# Patient Record
Sex: Female | Born: 1975 | Race: White | Hispanic: No | Marital: Single | Smoking: Never smoker
Health system: Southern US, Community
[De-identification: ages and names within clinical notes are randomized; demographics above are authoritative.]

## PROBLEM LIST (undated history)

## (undated) DIAGNOSIS — D259 Leiomyoma of uterus, unspecified: Secondary | ICD-10-CM

## (undated) DIAGNOSIS — F419 Anxiety disorder, unspecified: Secondary | ICD-10-CM

## (undated) DIAGNOSIS — G4733 Obstructive sleep apnea (adult) (pediatric): Secondary | ICD-10-CM

## (undated) DIAGNOSIS — D509 Iron deficiency anemia, unspecified: Secondary | ICD-10-CM

## (undated) DIAGNOSIS — D6852 Prothrombin gene mutation: Secondary | ICD-10-CM

## (undated) DIAGNOSIS — T7840XA Allergy, unspecified, initial encounter: Secondary | ICD-10-CM

## (undated) DIAGNOSIS — E282 Polycystic ovarian syndrome: Secondary | ICD-10-CM

## (undated) DIAGNOSIS — E669 Obesity, unspecified: Secondary | ICD-10-CM

## (undated) DIAGNOSIS — K632 Fistula of intestine: Secondary | ICD-10-CM

## (undated) DIAGNOSIS — I2699 Other pulmonary embolism without acute cor pulmonale: Secondary | ICD-10-CM

## (undated) HISTORY — DX: Polycystic ovarian syndrome: E28.2

## (undated) HISTORY — DX: Allergy, unspecified, initial encounter: T78.40XA

## (undated) HISTORY — DX: Obesity, unspecified: E66.9

## (undated) HISTORY — DX: Other pulmonary embolism without acute cor pulmonale: I26.99

## (undated) HISTORY — DX: Fistula of intestine: K63.2

## (undated) HISTORY — DX: Prothrombin gene mutation: D68.52

## (undated) HISTORY — PX: OTHER SURGICAL HISTORY: SHX169

## (undated) HISTORY — DX: Leiomyoma of uterus, unspecified: D25.9

## (undated) HISTORY — DX: Iron deficiency anemia, unspecified: D50.9

## (undated) HISTORY — DX: Anxiety disorder, unspecified: F41.9

## (undated) HISTORY — DX: Obstructive sleep apnea (adult) (pediatric): G47.33

---

## 1995-06-04 HISTORY — PX: MYOMECTOMY ABDOMINAL APPROACH: SUR870

## 1998-05-02 ENCOUNTER — Other Ambulatory Visit: Admission: RE | Admit: 1998-05-02 | Discharge: 1998-05-02 | Payer: Self-pay | Admitting: Obstetrics & Gynecology

## 2003-11-25 ENCOUNTER — Other Ambulatory Visit: Admission: RE | Admit: 2003-11-25 | Discharge: 2003-11-25 | Payer: Self-pay | Admitting: Obstetrics and Gynecology

## 2004-01-31 ENCOUNTER — Encounter: Admission: RE | Admit: 2004-01-31 | Discharge: 2004-04-30 | Payer: Self-pay | Admitting: Obstetrics and Gynecology

## 2004-05-30 ENCOUNTER — Encounter: Admission: RE | Admit: 2004-05-30 | Discharge: 2004-08-28 | Payer: Self-pay | Admitting: Obstetrics and Gynecology

## 2005-03-02 ENCOUNTER — Emergency Department (HOSPITAL_COMMUNITY): Admission: EM | Admit: 2005-03-02 | Discharge: 2005-03-02 | Payer: Self-pay | Admitting: Family Medicine

## 2005-06-19 ENCOUNTER — Other Ambulatory Visit: Admission: RE | Admit: 2005-06-19 | Discharge: 2005-06-19 | Payer: Self-pay | Admitting: Obstetrics and Gynecology

## 2006-08-06 ENCOUNTER — Emergency Department (HOSPITAL_COMMUNITY): Admission: EM | Admit: 2006-08-06 | Discharge: 2006-08-06 | Payer: Self-pay | Admitting: Emergency Medicine

## 2008-09-30 ENCOUNTER — Emergency Department (HOSPITAL_COMMUNITY): Admission: EM | Admit: 2008-09-30 | Discharge: 2008-09-30 | Payer: Self-pay | Admitting: Emergency Medicine

## 2009-01-31 ENCOUNTER — Emergency Department (HOSPITAL_COMMUNITY): Admission: EM | Admit: 2009-01-31 | Discharge: 2009-01-31 | Payer: Self-pay | Admitting: Family Medicine

## 2009-01-31 ENCOUNTER — Encounter: Payer: Self-pay | Admitting: Internal Medicine

## 2009-01-31 LAB — CONVERTED CEMR LAB
CO2: 21 meq/L
Chloride: 105 meq/L
Creatinine, Ser: 0.8 mg/dL
Sodium: 138 meq/L

## 2009-02-01 ENCOUNTER — Encounter: Payer: Self-pay | Admitting: Internal Medicine

## 2009-02-01 ENCOUNTER — Ambulatory Visit: Payer: Self-pay | Admitting: Vascular Surgery

## 2009-02-01 ENCOUNTER — Inpatient Hospital Stay (HOSPITAL_COMMUNITY): Admission: EM | Admit: 2009-02-01 | Discharge: 2009-02-06 | Payer: Self-pay | Admitting: Emergency Medicine

## 2009-02-01 ENCOUNTER — Encounter (INDEPENDENT_AMBULATORY_CARE_PROVIDER_SITE_OTHER): Payer: Self-pay | Admitting: Internal Medicine

## 2009-02-01 LAB — CONVERTED CEMR LAB
Hgb A1c MFr Bld: 5.3 %
LDL (calc): 23 mg/dL
LDL Cholesterol: 82 mg/dL
Total Protein: 6 g/dL
Triglyceride fasting, serum: 115 mg/dL

## 2009-02-02 ENCOUNTER — Ambulatory Visit: Payer: Self-pay | Admitting: Oncology

## 2009-02-02 ENCOUNTER — Encounter: Payer: Self-pay | Admitting: Internal Medicine

## 2009-02-02 DIAGNOSIS — I2699 Other pulmonary embolism without acute cor pulmonale: Secondary | ICD-10-CM

## 2009-02-02 DIAGNOSIS — R079 Chest pain, unspecified: Secondary | ICD-10-CM

## 2009-02-02 LAB — CONVERTED CEMR LAB
MCV: 80.5 fL
Platelets: 254 10*3/uL
RDW: 14.1 %
WBC: 9.3 10*3/uL

## 2009-02-05 ENCOUNTER — Encounter: Payer: Self-pay | Admitting: Internal Medicine

## 2009-02-05 LAB — CONVERTED CEMR LAB
Hemoglobin: 12 g/dL
WBC: 7.9 10*3/uL

## 2009-02-06 ENCOUNTER — Encounter: Payer: Self-pay | Admitting: Internal Medicine

## 2009-02-06 LAB — CONVERTED CEMR LAB
Albumin: 2.9 g/dL
CO2: 22 meq/L
Chloride: 104 meq/L
Creatinine, Ser: 0.64 mg/dL
HCT: 33.2 %
Hemoglobin: 11.4 g/dL
RBC: 4.17 M/uL
Total Bilirubin: 0.4 mg/dL

## 2009-02-07 ENCOUNTER — Ambulatory Visit: Payer: Self-pay | Admitting: Oncology

## 2009-02-07 LAB — PROTIME-INR
INR: 2.5 (ref 2.00–3.50)
Protime: 30 Seconds — ABNORMAL HIGH (ref 10.6–13.4)

## 2009-02-08 LAB — PROTIME-INR

## 2009-02-10 LAB — PROTIME-INR: Protime: 30 Seconds — ABNORMAL HIGH (ref 10.6–13.4)

## 2009-02-13 LAB — PROTIME-INR: Protime: 40.8 Seconds — ABNORMAL HIGH (ref 10.6–13.4)

## 2009-02-16 LAB — PROTIME-INR: Protime: 32.4 Seconds — ABNORMAL HIGH (ref 10.6–13.4)

## 2009-02-20 LAB — PROTIME-INR: INR: 3.5 (ref 2.00–3.50)

## 2009-02-24 LAB — PROTIME-INR: INR: 1.6 — ABNORMAL LOW (ref 2.00–3.50)

## 2009-02-27 LAB — PROTIME-INR: INR: 1.8 — ABNORMAL LOW (ref 2.00–3.50)

## 2009-03-02 LAB — PROTIME-INR: Protime: 25.2 Seconds — ABNORMAL HIGH (ref 10.6–13.4)

## 2009-03-09 ENCOUNTER — Ambulatory Visit: Payer: Self-pay | Admitting: Oncology

## 2009-03-09 LAB — CBC WITH DIFFERENTIAL/PLATELET
Basophils Absolute: 0.1 10*3/uL (ref 0.0–0.1)
Eosinophils Absolute: 0.2 10*3/uL (ref 0.0–0.5)
HCT: 37.7 % (ref 34.8–46.6)
HGB: 12.2 g/dL (ref 11.6–15.9)
MCH: 25.6 pg (ref 25.1–34.0)
MONO#: 0.3 10*3/uL (ref 0.1–0.9)
NEUT#: 3.3 10*3/uL (ref 1.5–6.5)
NEUT%: 52.9 % (ref 38.4–76.8)
RDW: 14.3 % (ref 11.2–14.5)
lymph#: 2.4 10*3/uL (ref 0.9–3.3)

## 2009-03-09 LAB — PROTIME-INR

## 2009-03-13 LAB — PROTIME-INR
INR: 2.4 (ref 2.00–3.50)
Protime: 28.8 Seconds — ABNORMAL HIGH (ref 10.6–13.4)

## 2009-03-16 LAB — PROTIME-INR
INR: 2.8 (ref 2.00–3.50)
Protime: 33.6 Seconds — ABNORMAL HIGH (ref 10.6–13.4)

## 2009-03-20 LAB — CBC WITH DIFFERENTIAL/PLATELET
BASO%: 0.6 % (ref 0.0–2.0)
Basophils Absolute: 0 10*3/uL (ref 0.0–0.1)
EOS%: 2.6 % (ref 0.0–7.0)
MCH: 26 pg (ref 25.1–34.0)
MCHC: 32.9 g/dL (ref 31.5–36.0)
MCV: 79 fL — ABNORMAL LOW (ref 79.5–101.0)
MONO%: 4.7 % (ref 0.0–14.0)
RDW: 14.7 % — ABNORMAL HIGH (ref 11.2–14.5)
lymph#: 1.8 10*3/uL (ref 0.9–3.3)

## 2009-03-20 LAB — COMPREHENSIVE METABOLIC PANEL
ALT: 35 U/L (ref 0–35)
Albumin: 3.9 g/dL (ref 3.5–5.2)
CO2: 23 mEq/L (ref 19–32)
Calcium: 8.7 mg/dL (ref 8.4–10.5)
Chloride: 104 mEq/L (ref 96–112)
Glucose, Bld: 103 mg/dL — ABNORMAL HIGH (ref 70–99)
Sodium: 139 mEq/L (ref 135–145)
Total Bilirubin: 0.3 mg/dL (ref 0.3–1.2)
Total Protein: 6.7 g/dL (ref 6.0–8.3)

## 2009-03-20 LAB — LACTATE DEHYDROGENASE: LDH: 164 U/L (ref 94–250)

## 2009-03-20 LAB — PROTIME-INR

## 2009-03-27 LAB — PROTIME-INR: Protime: 21.6 Seconds — ABNORMAL HIGH (ref 10.6–13.4)

## 2009-03-31 LAB — PROTIME-INR: Protime: 24 Seconds — ABNORMAL HIGH (ref 10.6–13.4)

## 2009-04-06 ENCOUNTER — Encounter: Payer: Self-pay | Admitting: Internal Medicine

## 2009-04-06 DIAGNOSIS — E669 Obesity, unspecified: Secondary | ICD-10-CM | POA: Insufficient documentation

## 2009-04-06 DIAGNOSIS — D509 Iron deficiency anemia, unspecified: Secondary | ICD-10-CM | POA: Insufficient documentation

## 2009-04-06 DIAGNOSIS — E282 Polycystic ovarian syndrome: Secondary | ICD-10-CM | POA: Insufficient documentation

## 2009-04-07 ENCOUNTER — Ambulatory Visit: Payer: Self-pay | Admitting: Oncology

## 2009-04-07 ENCOUNTER — Ambulatory Visit: Payer: Self-pay | Admitting: Internal Medicine

## 2009-04-07 LAB — PROTIME-INR: INR: 2.5 (ref 2.00–3.50)

## 2009-04-12 ENCOUNTER — Ambulatory Visit: Payer: Self-pay | Admitting: Internal Medicine

## 2009-04-12 LAB — CONVERTED CEMR LAB: POC INR: 2

## 2009-04-19 ENCOUNTER — Ambulatory Visit: Payer: Self-pay | Admitting: Cardiovascular Disease

## 2009-04-19 LAB — CONVERTED CEMR LAB: POC INR: 2.1

## 2009-04-26 ENCOUNTER — Ambulatory Visit: Payer: Self-pay | Admitting: Internal Medicine

## 2009-04-26 LAB — CONVERTED CEMR LAB: POC INR: 2.2

## 2009-05-01 ENCOUNTER — Encounter: Payer: Self-pay | Admitting: Internal Medicine

## 2009-05-01 LAB — CBC WITH DIFFERENTIAL/PLATELET
BASO%: 0.4 % (ref 0.0–2.0)
Basophils Absolute: 0 10*3/uL (ref 0.0–0.1)
EOS%: 1.8 % (ref 0.0–7.0)
HGB: 13.1 g/dL (ref 11.6–15.9)
MCH: 26.9 pg (ref 25.1–34.0)
MONO#: 0.3 10*3/uL (ref 0.1–0.9)
RDW: 16.4 % — ABNORMAL HIGH (ref 11.2–14.5)
WBC: 6.6 10*3/uL (ref 3.9–10.3)
lymph#: 2 10*3/uL (ref 0.9–3.3)

## 2009-05-01 LAB — COMPREHENSIVE METABOLIC PANEL
ALT: 33 U/L (ref 0–35)
Alkaline Phosphatase: 107 U/L (ref 39–117)
CO2: 22 mEq/L (ref 19–32)
Potassium: 3.8 mEq/L (ref 3.5–5.3)
Sodium: 140 mEq/L (ref 135–145)
Total Bilirubin: 0.3 mg/dL (ref 0.3–1.2)
Total Protein: 6.8 g/dL (ref 6.0–8.3)

## 2009-05-01 LAB — PROTHROMBIN TIME: INR: 1.85 — ABNORMAL HIGH (ref <1.50)

## 2009-05-09 ENCOUNTER — Ambulatory Visit: Payer: Self-pay | Admitting: Surgery

## 2009-05-09 ENCOUNTER — Encounter (HOSPITAL_COMMUNITY): Payer: Self-pay | Admitting: Oncology

## 2009-05-09 ENCOUNTER — Ambulatory Visit: Admission: RE | Admit: 2009-05-09 | Discharge: 2009-05-09 | Payer: Self-pay | Admitting: Oncology

## 2009-05-10 ENCOUNTER — Ambulatory Visit: Payer: Self-pay | Admitting: Cardiology

## 2009-05-10 LAB — CONVERTED CEMR LAB: POC INR: 2.9

## 2009-05-24 ENCOUNTER — Ambulatory Visit: Payer: Self-pay | Admitting: Internal Medicine

## 2009-06-21 ENCOUNTER — Ambulatory Visit: Payer: Self-pay | Admitting: Cardiovascular Disease

## 2009-07-19 ENCOUNTER — Ambulatory Visit: Payer: Self-pay | Admitting: Oncology

## 2009-07-19 ENCOUNTER — Ambulatory Visit: Payer: Self-pay | Admitting: Internal Medicine

## 2009-07-24 LAB — CBC WITH DIFFERENTIAL/PLATELET
BASO%: 0.4 % (ref 0.0–2.0)
Eosinophils Absolute: 0.1 10*3/uL (ref 0.0–0.5)
LYMPH%: 38.5 % (ref 14.0–49.7)
MCHC: 33 g/dL (ref 31.5–36.0)
MONO#: 0.3 10*3/uL (ref 0.1–0.9)
NEUT#: 3 10*3/uL (ref 1.5–6.5)
Platelets: 174 10*3/uL (ref 145–400)
RBC: 4.9 10*6/uL (ref 3.70–5.45)
RDW: 14.7 % — ABNORMAL HIGH (ref 11.2–14.5)
WBC: 5.6 10*3/uL (ref 3.9–10.3)
lymph#: 2.2 10*3/uL (ref 0.9–3.3)
nRBC: 0 % (ref 0–0)

## 2009-07-25 LAB — LUPUS ANTICOAGULANT PANEL
DRVVT: 65.4 secs — ABNORMAL HIGH (ref 36.2–44.3)
PTT Lupus Anticoagulant: 63.8 secs — ABNORMAL HIGH (ref 32.0–43.4)
PTTLA Confirmation: 5.9 secs (ref <8.0)

## 2009-08-16 ENCOUNTER — Encounter (INDEPENDENT_AMBULATORY_CARE_PROVIDER_SITE_OTHER): Payer: Self-pay | Admitting: *Deleted

## 2009-08-16 ENCOUNTER — Encounter (INDEPENDENT_AMBULATORY_CARE_PROVIDER_SITE_OTHER): Payer: Self-pay | Admitting: Cardiology

## 2009-08-16 ENCOUNTER — Ambulatory Visit: Payer: Self-pay | Admitting: Internal Medicine

## 2009-08-22 ENCOUNTER — Telehealth: Payer: Self-pay | Admitting: Internal Medicine

## 2009-09-06 ENCOUNTER — Ambulatory Visit: Payer: Self-pay | Admitting: Cardiology

## 2009-09-12 ENCOUNTER — Ambulatory Visit: Payer: Self-pay | Admitting: Internal Medicine

## 2009-09-12 DIAGNOSIS — R609 Edema, unspecified: Secondary | ICD-10-CM

## 2009-10-09 ENCOUNTER — Ambulatory Visit: Payer: Self-pay | Admitting: Pulmonary Disease

## 2009-10-09 DIAGNOSIS — G4721 Circadian rhythm sleep disorder, delayed sleep phase type: Secondary | ICD-10-CM

## 2009-10-09 DIAGNOSIS — G4733 Obstructive sleep apnea (adult) (pediatric): Secondary | ICD-10-CM | POA: Insufficient documentation

## 2009-10-12 ENCOUNTER — Ambulatory Visit: Payer: Self-pay

## 2009-10-12 ENCOUNTER — Ambulatory Visit: Payer: Self-pay | Admitting: Internal Medicine

## 2009-10-12 ENCOUNTER — Ambulatory Visit (HOSPITAL_COMMUNITY): Admission: RE | Admit: 2009-10-12 | Discharge: 2009-10-12 | Payer: Self-pay | Admitting: Internal Medicine

## 2009-10-12 ENCOUNTER — Encounter: Payer: Self-pay | Admitting: Internal Medicine

## 2009-10-12 LAB — CONVERTED CEMR LAB: POC INR: 1.8

## 2009-10-19 ENCOUNTER — Encounter: Payer: Self-pay | Admitting: Pulmonary Disease

## 2009-10-19 ENCOUNTER — Ambulatory Visit (HOSPITAL_BASED_OUTPATIENT_CLINIC_OR_DEPARTMENT_OTHER): Admission: RE | Admit: 2009-10-19 | Discharge: 2009-10-19 | Payer: Self-pay | Admitting: Pulmonary Disease

## 2009-10-26 ENCOUNTER — Ambulatory Visit: Payer: Self-pay | Admitting: Cardiology

## 2009-10-26 LAB — CONVERTED CEMR LAB: POC INR: 2.6

## 2009-10-27 ENCOUNTER — Ambulatory Visit: Payer: Self-pay | Admitting: Pulmonary Disease

## 2009-11-14 ENCOUNTER — Ambulatory Visit: Payer: Self-pay | Admitting: Pulmonary Disease

## 2009-11-16 ENCOUNTER — Ambulatory Visit: Payer: Self-pay | Admitting: Cardiology

## 2009-11-16 LAB — CONVERTED CEMR LAB: POC INR: 2.1

## 2009-11-17 ENCOUNTER — Ambulatory Visit: Payer: Self-pay | Admitting: Oncology

## 2009-11-24 LAB — VON WILLEBRAND PANEL
Factor-VIII Activity: 122 % (ref 50–150)
Ristocetin-Cofactor: 136 % (ref 50–150)
Von Willebrand Ag: 219 % normal — ABNORMAL HIGH (ref 61–164)

## 2009-11-24 LAB — D-DIMER, QUANTITATIVE: D-Dimer, Quant: 0.22 ug/mL-FEU (ref 0.00–0.48)

## 2009-12-13 ENCOUNTER — Ambulatory Visit: Payer: Self-pay | Admitting: Internal Medicine

## 2009-12-14 ENCOUNTER — Ambulatory Visit: Payer: Self-pay | Admitting: Internal Medicine

## 2010-01-11 ENCOUNTER — Ambulatory Visit: Payer: Self-pay | Admitting: Cardiovascular Disease

## 2010-01-11 LAB — CONVERTED CEMR LAB: POC INR: 1.6

## 2010-01-24 ENCOUNTER — Ambulatory Visit: Payer: Self-pay | Admitting: Internal Medicine

## 2010-01-24 LAB — CONVERTED CEMR LAB: POC INR: 2.6

## 2010-02-21 ENCOUNTER — Ambulatory Visit: Payer: Self-pay | Admitting: Cardiology

## 2010-03-20 ENCOUNTER — Ambulatory Visit: Payer: Self-pay | Admitting: Oncology

## 2010-03-21 ENCOUNTER — Ambulatory Visit: Payer: Self-pay | Admitting: Internal Medicine

## 2010-03-22 ENCOUNTER — Encounter: Payer: Self-pay | Admitting: Internal Medicine

## 2010-03-25 LAB — D-DIMER, QUANTITATIVE: D-Dimer, Quant: 0.22 ug/mL-FEU (ref 0.00–0.48)

## 2010-03-25 LAB — VON WILLEBRAND PANEL
Factor-VIII Activity: 87 % (ref 50–150)
Von Willebrand Ag: 59 % normal — ABNORMAL LOW (ref 61–164)

## 2010-03-30 LAB — PROTHROMBIN GENE MUTATION

## 2010-04-18 ENCOUNTER — Ambulatory Visit: Payer: Self-pay | Admitting: Internal Medicine

## 2010-04-18 ENCOUNTER — Ambulatory Visit: Payer: Self-pay | Admitting: Cardiology

## 2010-05-09 ENCOUNTER — Ambulatory Visit: Payer: Self-pay | Admitting: Cardiology

## 2010-05-30 ENCOUNTER — Ambulatory Visit
Admission: RE | Admit: 2010-05-30 | Discharge: 2010-05-30 | Payer: Self-pay | Source: Home / Self Care | Attending: Internal Medicine | Admitting: Internal Medicine

## 2010-05-30 ENCOUNTER — Ambulatory Visit: Payer: Self-pay | Admitting: Cardiology

## 2010-05-30 DIAGNOSIS — R1013 Epigastric pain: Secondary | ICD-10-CM | POA: Insufficient documentation

## 2010-05-30 LAB — CONVERTED CEMR LAB
AST: 25 units/L (ref 0–37)
Albumin: 3.7 g/dL (ref 3.5–5.2)
Alkaline Phosphatase: 108 units/L (ref 39–117)
Basophils Absolute: 0 10*3/uL (ref 0.0–0.1)
Bilirubin, Direct: 0 mg/dL (ref 0.0–0.3)
Calcium: 9.1 mg/dL (ref 8.4–10.5)
Creatinine, Ser: 0.7 mg/dL (ref 0.4–1.2)
Eosinophils Absolute: 0.5 10*3/uL (ref 0.0–0.7)
GFR calc non Af Amer: 107.24 mL/min (ref >60.00)
Glucose, Bld: 85 mg/dL (ref 70–99)
Hemoglobin: 13.6 g/dL (ref 12.0–15.0)
Lymphocytes Relative: 30.4 % (ref 12.0–46.0)
MCHC: 33.6 g/dL (ref 30.0–36.0)
Monocytes Relative: 4.3 % (ref 3.0–12.0)
Neutro Abs: 3.9 10*3/uL (ref 1.4–7.7)
Neutrophils Relative %: 56.9 % (ref 43.0–77.0)
RBC: 4.82 M/uL (ref 3.87–5.11)
RDW: 14.8 % — ABNORMAL HIGH (ref 11.5–14.6)
Sodium: 139 meq/L (ref 135–145)

## 2010-06-06 ENCOUNTER — Ambulatory Visit: Admission: RE | Admit: 2010-06-06 | Discharge: 2010-06-06 | Payer: Self-pay | Source: Home / Self Care

## 2010-06-19 ENCOUNTER — Ambulatory Visit: Payer: Self-pay | Admitting: Oncology

## 2010-06-21 ENCOUNTER — Encounter: Payer: Self-pay | Admitting: Internal Medicine

## 2010-06-21 LAB — CBC WITH DIFFERENTIAL/PLATELET
BASO%: 0.5 % (ref 0.0–2.0)
Basophils Absolute: 0 10*3/uL (ref 0.0–0.1)
EOS%: 4.9 % (ref 0.0–7.0)
Eosinophils Absolute: 0.3 10*3/uL (ref 0.0–0.5)
HCT: 39.3 % (ref 34.8–46.6)
HGB: 13.4 g/dL (ref 11.6–15.9)
LYMPH%: 28.9 % (ref 14.0–49.7)
MCH: 27.9 pg (ref 25.1–34.0)
MCHC: 34.1 g/dL (ref 31.5–36.0)
MCV: 81.7 fL (ref 79.5–101.0)
MONO#: 0.3 10*3/uL (ref 0.1–0.9)
MONO%: 4.2 % (ref 0.0–14.0)
NEUT#: 3.8 10*3/uL (ref 1.5–6.5)
NEUT%: 61.5 % (ref 38.4–76.8)
Platelets: 227 10*3/uL (ref 145–400)
RBC: 4.81 10*6/uL (ref 3.70–5.45)
RDW: 14.2 % (ref 11.2–14.5)
WBC: 6.1 10*3/uL (ref 3.9–10.3)
lymph#: 1.8 10*3/uL (ref 0.9–3.3)

## 2010-06-22 LAB — D-DIMER, QUANTITATIVE: D-Dimer, Quant: 0.22 ug/mL-FEU (ref 0.00–0.48)

## 2010-06-22 LAB — TRANSFERRIN RECEPTOR, SOLUABLE: Transferrin Receptor, Soluble: 21.4 nmol/L

## 2010-06-22 LAB — COMPREHENSIVE METABOLIC PANEL
ALT: 17 U/L (ref 0–35)
AST: 17 U/L (ref 0–37)
Albumin: 4 g/dL (ref 3.5–5.2)
Alkaline Phosphatase: 108 U/L (ref 39–117)
BUN: 9 mg/dL (ref 6–23)
CO2: 22 mEq/L (ref 19–32)
Calcium: 8.8 mg/dL (ref 8.4–10.5)
Chloride: 103 mEq/L (ref 96–112)
Creatinine, Ser: 0.66 mg/dL (ref 0.40–1.20)
Glucose, Bld: 94 mg/dL (ref 70–99)
Potassium: 4.4 mEq/L (ref 3.5–5.3)
Sodium: 137 mEq/L (ref 135–145)
Total Bilirubin: 0.4 mg/dL (ref 0.3–1.2)
Total Protein: 6.5 g/dL (ref 6.0–8.3)

## 2010-06-22 LAB — FERRITIN: Ferritin: 66 ng/mL (ref 10–291)

## 2010-06-22 LAB — LACTATE DEHYDROGENASE: LDH: 166 U/L (ref 94–250)

## 2010-06-22 LAB — IRON AND TIBC
%SAT: 9 % — ABNORMAL LOW (ref 20–55)
Iron: 32 ug/dL — ABNORMAL LOW (ref 42–145)
TIBC: 373 ug/dL (ref 250–470)
UIBC: 341 ug/dL

## 2010-07-04 ENCOUNTER — Encounter: Payer: Self-pay | Admitting: Internal Medicine

## 2010-07-04 ENCOUNTER — Ambulatory Visit: Admit: 2010-07-04 | Payer: Self-pay

## 2010-07-04 ENCOUNTER — Encounter (INDEPENDENT_AMBULATORY_CARE_PROVIDER_SITE_OTHER): Payer: PRIVATE HEALTH INSURANCE

## 2010-07-04 DIAGNOSIS — I2699 Other pulmonary embolism without acute cor pulmonale: Secondary | ICD-10-CM

## 2010-07-04 DIAGNOSIS — Z7901 Long term (current) use of anticoagulants: Secondary | ICD-10-CM

## 2010-07-05 NOTE — Medication Information (Signed)
Summary: [name] coumadin - [facility]  Anticoagulant Therapy  Managed by: Bethena Midget, RN, BSN Referring MD: Gwendolyn Fill PCP: Newt Lukes MD Supervising MD: Antoine Poche MD, Fayrene Fearing Indication 1: Pulmonary Embolism Lab Used: [facility] [place] Site: [address] INR POC 2.9 INR RANGE 2.0-3.0  Dietary changes: no    Health status changes: no    Bleeding/hemorrhagic complications: no    Recent/future hospitalizations: no    Any changes in medication regimen? no    Recent/future dental: no  Any missed doses?: no       Is patient compliant with meds? yes       Allergies: No Known Drug Allergies  Anticoagulation Management History:      The patient is taking warfarin and comes in today for a routine follow up visit.  Negative risk factors for bleeding include an age less than 56 years old.  The bleeding index is 'low risk'.  Negative CHADS2 values include Age > 53 years old.  Anticoagulation responsible provider: Antoine Poche MD, Fayrene Fearing.  INR POC: 2.9.  Cuvette Lot#: [id number].  Exp: 07/2011.    Anticoagulation Management Assessment/Plan:      The patient's current anticoagulation dose is Coumadin 5 mg tabs: take as directed.  The target INR is 2.0-3.0.  The next INR is due 07/04/2010.  Anticoagulation instructions were given to patient.  Results were reviewed/authorized by Bethena Midget, RN, BSN.  She was notified by Bethena Midget, RN, BSN.         Prior Anticoagulation Instructions: INR 2.6  Coumadin 5mg  tab take 1.5tab each day except 2 tabs on SUN and WED  Current Anticoagulation Instructions: INR 2.9 Today only take 7.5mg s Continue 7.5mg s everyday except 10mg s on Sundays and Wednesdays. Recheck in 4 weeks.

## 2010-07-05 NOTE — Medication Information (Signed)
Summary: rov.mp  Anticoagulant Therapy  Managed by: Elaina Pattee, PharmD PCP: Newt Lukes MD Supervising MD: Riley Kill MD, Maisie Fus Indication 1: Pulmonary Embolism Lab Used: [facility] [place] Site: [address] INR POC 2.8 INR RANGE 2.0-3.0  Dietary changes: yes       Details: Pt trying to eat more salads on a regular basis.  Health status changes: no    Bleeding/hemorrhagic complications: no    Recent/future hospitalizations: no    Any changes in medication regimen? no    Recent/future dental: no  Any missed doses?: no       Is patient compliant with meds? yes       Allergies: No Known Drug Allergies  Anticoagulation Management History:      The patient is taking warfarin and comes in today for a routine follow up visit.  Negative risk factors for bleeding include an age less than 12 years old.  The bleeding index is 'low risk'.  Negative CHADS2 values include Age > 40 years old.  Anticoagulation responsible provider: Riley Kill MD, Maisie Fus.  INR POC: 2.8.  Cuvette Lot#: [id number].  Exp: 10/2010.    Anticoagulation Management Assessment/Plan:      The patient's current anticoagulation dose is Coumadin 5 mg tabs: take as directed.  The target INR is 2.0-3.0.  The next INR is due 10/04/2009.  Anticoagulation instructions were given to patient.  Results were reviewed/authorized by Elaina Pattee, PharmD.  She was notified by Elaina Pattee, PharmD.         Prior Anticoagulation Instructions: INR 1.9  Take 2 tabs today, then increase to 1.5 tabs daily.  Recheck in 3 weeks.   Current Anticoagulation Instructions: INR 2.8. Take 1.5 tablets daily.  Recheck in 4 weeks.

## 2010-07-05 NOTE — Assessment & Plan Note (Signed)
Summary: rov/apc   Visit Type:  Follow-up Copy to:  Newt Lukes MD Primary Provider/Referring Provider:  Newt Lukes MD  CC:  The patient is here to discuss sleep study results..  History of Present Illness: 35 yo female with Obstructive sleep apnea.  She had her sleep test on May 19.  This showed mild to moderate OSA with AHI 8, RDI 18.   Current Medications (verified): 1)  Iron 325 (65 Fe) Mg Tabs (Ferrous Sulfate) .... Take 1 Two Times A Day 2)  Coumadin 5 Mg Tabs (Warfarin Sodium) .... Take As Directed 3)  Hydrocodone-Acetaminophen 5-500 Mg Tabs (Hydrocodone-Acetaminophen) .... Take 1-2 By Mouth Q 6 Hours Prn 4)  [employer] Geophysicist/field seismologist (Respiratory Therapy Supplies) .... 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 Removal of fibriods- 1997  Vital Signs:  Patient profile:   35 year old female Height:      58 inches (147.32 cm) Weight:      246 pounds (111.82 kg) BMI:     51.60 O2 Sat:      100 % on Room air Temp:     98.1 degrees F (36.72 degrees C) oral Pulse rate:   96 / minute BP sitting:   128 / 74  (left arm) Cuff size:   large  Vitals Entered By: Michel Bickers CMA (November 14, 2009 3:57 PM)  O2 Sat at Rest %:  100 O2 Flow:  Room air CC: The patient is here to discuss sleep study results. Comments Medications reviewed. Daytiem phone verified. Michel Bickers CMA  November 14, 2009 3:59 PM   Physical Exam  Nose:  no deformity, discharge, inflammation, or lesions Mouth:  MP 4, enlarged tongue, 2+ tonsills Neck:  no JVD.   Lungs:  clear bilaterally to auscultation and percussion Heart:  regular rate and rhythm,  S1, S2 without murmurs, rubs, gallops, or clicks Extremities:  no clubbing, cyanosis, edema, or deformity noted Cervical Nodes:  no significant adenopathy   Impression & Recommendations:  Problem # 1:  OBSTRUCTIVE SLEEP APNEA (ICD-327.23)  She has mild to moderate sleep apnea.  I reviewed her sleep test results with her.  I discussed how sleep apnea can affect her health.  Driving precautions were discussed.  I explained the importance of weight reduction.  Reviewed various treatment options for her sleep apnea.  She will check with her insurance company to see if they will cover an oral appliance.  If not, then will proceed with auto-CPAP set up.  Orders: Est. 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  Problem # 3:  OBESITY (ICD-278.00)  Discussed options about how to achieve better weight control.  Complete Medication List: 1)  Iron 325 (65 Fe) Mg Tabs (Ferrous sulfate) .... Take 1 two times a day 2)  Coumadin 5 Mg Tabs (Warfarin sodium) .... Take as directed 3)  Hydrocodone-acetaminophen 5-500 Mg Tabs (Hydrocodone-acetaminophen) .... Take 1-2 by mouth q 6 hours prn 4)  [employer] (Respiratory therapy supplies) .... Only during flights  Patient Instructions: 1)  Check with  your insurance company to see if they will cover an oral appliance for treating obstructive sleep apnea 2)  Follow up in 6 to 8 weeks

## 2010-07-05 NOTE — Medication Information (Signed)
Summary: rov/sp  Anticoagulant Therapy  Managed by: Bethena Midget, RN, BSN PCP: Newt Lukes MD Supervising MD: Myrtis Ser MD, Tinnie Gens Indication 1: Pulmonary Embolism Lab Used: [facility] [place] Site: [address] INR POC 2.1 INR RANGE 2.0-3.0  Dietary changes: no    Health status changes: no    Bleeding/hemorrhagic complications: no    Recent/future hospitalizations: no    Any changes in medication regimen? no    Recent/future dental: no  Any missed doses?: no       Is patient compliant with meds? yes       Allergies: No Known Drug Allergies  Anticoagulation Management History:      The patient is taking warfarin and comes in today for a routine follow up visit.  Negative risk factors for bleeding include an age less than 34 years old.  The bleeding index is 'low risk'.  Negative CHADS2 values include Age > 29 years old.  Anticoagulation responsible provider: Myrtis Ser MD, Tinnie Gens.  INR POC: 2.1.  Cuvette Lot#: [id number].  Exp: 01/2011.    Anticoagulation Management Assessment/Plan:      The patient's current anticoagulation dose is Coumadin 5 mg tabs: take as directed.  The target INR is 2.0-3.0.  The next INR is due 12/14/2009.  Anticoagulation instructions were given to patient.  Results were reviewed/authorized by Bethena Midget, RN, BSN.  She was notified by Bethena Midget, RN, BSN.         Prior Anticoagulation Instructions: INR 2.6  Continue same dose of 1 1/2 tablets every day except 2 tablets on Sunday  Current Anticoagulation Instructions: INR 2.1 Today take 10mg s then Continue 7.5mg s everyday except 10mg s on Sundays. Recheck in 4 weeks.  Prescriptions: COUMADIN 5 MG TABS (WARFARIN SODIUM) take as directed  #60 x 3   Entered by:   Bethena Midget, RN, BSN   Authorized by:   Talitha Givens, MD, [facility]   Signed by:   Bethena Midget, RN, BSN on 11/16/2009   Method used:   Electronically to        [facility] [address] (retail)  [address]       [place], [place]  [address]       Ph: [phone]       Fax: [phone]   RxID:   [id number]

## 2010-07-05 NOTE — Medication Information (Signed)
Summary: rov/cb  Anticoagulant Therapy  Managed by: Bethena Midget, RN, BSN PCP: Newt Lukes MD Supervising MD: Johney Frame MD, Fayrene Fearing Indication 1: Pulmonary Embolism Lab Used: [facility] [place] Site: [address] INR POC 1.8 INR RANGE 2.0-3.0  Dietary changes: yes       Details: Increasing some green leafy veggies  Health status changes: no    Bleeding/hemorrhagic complications: no    Recent/future hospitalizations: no    Any changes in medication regimen? no    Recent/future dental: no  Any missed doses?: no       Is patient compliant with meds? yes       Allergies: No Known Drug Allergies  Anticoagulation Management History:      The patient is taking warfarin and comes in today for a routine follow up visit.  Negative risk factors for bleeding include an age less than 69 years old.  The bleeding index is 'low risk'.  Negative CHADS2 values include Age > 45 years old.  Anticoagulation responsible provider: Aylan Bayona MD, Fayrene Fearing.  INR POC: 1.8.  Cuvette Lot#: [id number].  Exp: 01/2011.    Anticoagulation Management Assessment/Plan:      The patient's current anticoagulation dose is Coumadin 5 mg tabs: take as directed.  The target INR is 2.0-3.0.  The next INR is due 10/26/2009.  Anticoagulation instructions were given to patient.  Results were reviewed/authorized by Bethena Midget, RN, BSN.  She was notified by Bethena Midget, RN, BSN.         Prior Anticoagulation Instructions: INR 2.8. Take 1.5 tablets daily.  Recheck in 4 weeks.  Current Anticoagulation Instructions: INR 1.8 Today take 10mg s then change dose to 7.5mg s daily except 10mg s on  Sundays. Recheck in 2 weeks.

## 2010-07-05 NOTE — Medication Information (Signed)
Summary: rov/tm  Anticoagulant Therapy  Managed by: Weston Brass, PharmD PCP: Newt Lukes MD Supervising MD: Myrtis Ser MD, Tinnie Gens Indication 1: Pulmonary Embolism Lab Used: [facility] [place] Site: [address] INR POC 3.3 INR RANGE 2.0-3.0  Dietary changes: no    Health status changes: no    Bleeding/hemorrhagic complications: no    Recent/future hospitalizations: no    Any changes in medication regimen? no    Recent/future dental: no  Any missed doses?: no       Is patient compliant with meds? yes      Comments: Pt was advised to only take 1 tablet today and to eat a serving of greens today and was educated on unusal bleeding  Allergies (verified): No Known Drug Allergies  Anticoagulation Management History:      The patient is taking warfarin and comes in today for a routine follow up visit.  Negative risk factors for bleeding include an age less than 31 years old.  The bleeding index is 'low risk'.  Negative CHADS2 values include Age > 39 years old.  Anticoagulation responsible Prapti Grussing: Myrtis Ser MD, Tinnie Gens.  INR POC: 3.3.  Cuvette Lot#: [id number].  Exp: 04/2011.    Anticoagulation Management Assessment/Plan:      The patient's current anticoagulation dose is Coumadin 5 mg tabs: take as directed.  The target INR is 2.0-3.0.  The next INR is due 05/09/2010.  Anticoagulation instructions were given to patient.  Results were reviewed/authorized by Weston Brass, PharmD.  She was notified by Hoy Register, PharmD Candidate.         Prior Anticoagulation Instructions: INR 2.8 Continue 7.5mg s daily except 10mg s on Sundays and Wednesdays. Recheck in 4 weeks.   Current Anticoagulation Instructions: INR 3.3 Only take 1 tablet today then resume previous dose of 1.5 tablets everyday except 2 tablets on Sunday and Wednesday Recheck INR in 3 weeks

## 2010-07-05 NOTE — Assessment & Plan Note (Signed)
Summary: UPPER STOMACH PAIN--VOMITING X 1 WK  STC   Vital Signs:  Patient profile:   35 year old female Height:      58 inches (147.32 cm) Weight:      237.2 pounds (107.82 kg) O2 Sat:      98 % on Room air Temp:     98.3 degrees F (36.83 degrees C) oral Pulse rate:   82 / minute BP sitting:   100 / 78  (left arm) Cuff size:   large  Vitals Entered By: Orlan Leavens RMA (May 30, 2010 10:47 AM)  O2 Flow:  Room air CC: Stomach pain x's 2 weeks Is Patient Diabetic? No Pain Assessment Patient in pain? yes     Location: stomach Type: aching Onset of pain  Pt states been having some vomitting   Primary Care Provider:  Newt Lukes MD  CC:  Stomach pain x's 2 weeks.  History of Present Illness: here for abd pain pain located over epigastric region and radiates to back pain severity 9/10 at present - progressive in past 24h onset 2 weeks ago exac chronic symptoms of bloating a/w nausea now 24h vomitting - yellow clear bile, no blood or melena pain worse with any food - also worse with movement (lifting dad off ground) +fever, intermit diarrhea (none last 48h) no hx pain this severe in abd no SOB, CP, no syncope or HA - no trauma pain relieved in part by hydrocodone last pm - but now as bad or worse than last pm  reviewed chronic med issues 1) bilateral PE late 01/2009 -hosp for same 9/1-9/6 at [facility] prior to that dx, on estrogen tx nuvaring for PCOS (hosp summary, labs and CT results reviewed) no evidence for hypercoag state on lab panels - has been following with heme ([facility]) - f/u 06/2010 to see if can go off coumadin also [facility] for coumadin mgmt  no bleeding or bruising problems  2) PCOS - follows now with dr. Pennie Rushing (gyn) plans for "induction" bleeding are being negotiated with heme given anticoag issues -  "no go" for any hormone tx no hx abn PAP - still uncomfortable due to menses and heavy bleeding  3) obesity - reports weight has been stable since  PE dx d/t resitricted activity does not follow diet or exercise program - no known complications related to obesity (chol ok in hosp, no DM, no arthritis)  4) edema -  improved from this spring - if occurs, will happen at end of day - tight swelling both ankles and feet, resolve with elvation and better each AM - no asymmetry swellling - no SOB reviewd prior echo 10/2009 - normal   5) OSA - sleep study done 11/2009 - now seeing pulm for same - ?start CPAP vs plates at [facility]  Clinical Review Panels:  CBC   WBC:  6.6 (02/06/2009)   RBC:  4.17 (02/06/2009)   Hgb:  11.4 (02/06/2009)   Hct:  33.2 (02/06/2009)   Platelets:  267 (02/05/2009)   MCV  79.6 (02/06/2009)   RDW  14.3 (02/06/2009)  Complete Metabolic Panel   Glucose:  94 (02/06/2009)   Sodium:  138 (02/06/2009)   Potassium:  4.4 (02/06/2009)   Chloride:  104 (02/06/2009)   CO2:  22 (02/06/2009)   BUN:  9 (02/06/2009)   Creatinine:  0.64 (02/06/2009)   Albumin:  2.9 (02/06/2009)   Total Protein:  6.7 (02/06/2009)   Calcium:  9.0 (02/06/2009)   Total Bili:  0.4 (  02/06/2009)   Alk Phos:  88 (02/06/2009)   SGPT (ALT):  23 (02/06/2009)   SGOT (AST):  20 (02/06/2009)   Current Medications (verified): 1)  Iron 325 (65 Fe) Mg Tabs (Ferrous Sulfate) .... Take 1 Two Times A Day 2)  Coumadin 5 Mg Tabs (Warfarin Sodium) .... Take As Directed 3)  Hydrocodone-Acetaminophen 5-500 Mg Tabs (Hydrocodone-Acetaminophen) .... Take 1-2 By Mouth Q 6 Hours Prn 4)  [employer] Geophysicist/field seismologist (Respiratory Therapy Supplies) .... Only During Flights  Allergies (verified): No Known Drug Allergies  Past History:  Past Medical History: Anemia-iron deficiency Anticoagulation therapy Pulmonary emboli, bilateral 02/2009 PCOS  obesity OSA         - PSG 10/19/09 RDI 18    MD roster:  gyn -[facility] - [facility] - sood   Past Surgical History: Removal of fibriods- 1997  Social History: Never Smoked lives with mom and  fiance in [place] house works as Insurance claims handler  Review of Systems  The patient denies weight loss, hemoptysis, melena, hematochezia, and hematuria.    Physical Exam  General:  obese, alert, well-developed, well-nourished, and cooperative to examination.   uncomfortable with tears in eyes during abd exam Eyes:  vision grossly intact; pupils equal, round and reactive to light.  conjunctiva and lids normal.    Lungs:  normal respiratory effort, no intercostal retractions or use of accessory muscles; normal breath sounds bilaterally - no crackles and no wheezes.    Heart:  normal rate, regular rhythm, no murmur, and no rub. BLE with trace edema.  Abdomen:  obese, tender to gentle palp over epigastric region with mild gaurding but no rebound - diminished bs but present throughout Neurologic:  alert & oriented X3 and cranial nerves II-XII symetrically intact.  strength normal in all extremities, sensation intact to light touch, and gait normal. speech fluent without dysarthria or aphasia; follows commands with good comprehension.  Psych:  Oriented X3, memory intact for recent and remote, normally interactive, good eye contact, not anxious appearing, not depressed appearing, and not agitated.      Impression & Recommendations:  Problem # 1:  ABDOMINAL PAIN, EPIGASTRIC (ICD-789.06)  labs and CT abd w/ contrast today - npo until results reviewed tordol shot for pain now if labs and CT ok, plan BRAT diet and med symptom control of pain/nausea  Orders: TLB-BMP (Basic Metabolic Panel-BMET) (80048-METABOL) TLB-CBC Platelet - w/Differential (85025-CBCD) TLB-Hepatic/Liver Function Pnl (80076-HEPATIC) Ketorolac-Toradol 15mg  (Z6109) Admin of Therapeutic Inj  intramuscular or subcutaneous (60454) Misc. Referral (Misc. Ref)  Complete Medication List: 1)  Iron 325 (65 Fe) Mg Tabs (Ferrous sulfate) .... Take 1 two times a day 2)  Coumadin 5 Mg Tabs (Warfarin sodium) .... Take as  directed 3)  Hydrocodone-acetaminophen 5-500 Mg Tabs (Hydrocodone-acetaminophen) .... Take 1-2 by mouth q 6 hours prn 4)  [employer] (Respiratory therapy supplies) .... Only during flights  Patient Instructions: 1)  it was good to see you today. 2)  test(s) ordered today - blood work and CT scan - your results and plans for treatment will be called to you after review 3)  nothing to eat until tests complete - sips water ok -  4)  pain shot given today 5)  if your symptoms continue to worsen (pain, fever, etc), or if you are unable take anything by mouth (pills, fluids, etc), you should go to the emergency room for further evaluation and treatment.  6)  Please keep scheduled follow-up appointment as planned,  call sooner if problems.    Medication Administration  Injection # 1:    Medication: Ketorolac-Toradol 15mg     Diagnosis: ABDOMINAL PAIN, EPIGASTRIC (ICD-789.06)    Route: IM    Site: LUOQ gluteus    Exp Date: 06/04/2011    Lot #: [id number]    Mfr: Novaplus    Comments: Gave total 30mg     Patient tolerated injection without complications    Given by: Orlan Leavens RMA (May 30, 2010 11:20 AM)  Orders Added: 1)  TLB-BMP (Basic Metabolic Panel-BMET) [80048-METABOL] 2)  TLB-CBC Platelet - w/Differential [85025-CBCD] 3)  TLB-Hepatic/Liver Function Pnl [80076-HEPATIC] 4)  Ketorolac-Toradol 15mg  [J1885] 5)  Admin of Therapeutic Inj  intramuscular or subcutaneous [96372] 6)  Misc. Referral [Misc. Ref] 7)  Est. Patient Level IV [78295]

## 2010-07-05 NOTE — Medication Information (Signed)
Summary: rov/tm  Anticoagulant Therapy  Managed by: Weston Brass, PharmD PCP: Newt Lukes MD Supervising MD: Daleen Squibb MD, Maisie Fus Indication 1: Pulmonary Embolism Lab Used: [facility] [place] Site: [address] INR POC 2.6 INR RANGE 2.0-3.0  Dietary changes: no    Health status changes: no    Bleeding/hemorrhagic complications: no    Recent/future hospitalizations: no    Any changes in medication regimen? no    Recent/future dental: no  Any missed doses?: no       Is patient compliant with meds? yes       Allergies: No Known Drug Allergies  Anticoagulation Management History:      The patient is taking warfarin and comes in today for a routine follow up visit.  Negative risk factors for bleeding include an age less than 47 years old.  The bleeding index is 'low risk'.  Negative CHADS2 values include Age > 80 years old.  Anticoagulation responsible provider: Daleen Squibb MD, Maisie Fus.  INR POC: 2.6.  Cuvette Lot#: [id number].  Exp: 01/2011.    Anticoagulation Management Assessment/Plan:      The patient's current anticoagulation dose is Coumadin 5 mg tabs: take as directed.  The target INR is 2.0-3.0.  The next INR is due 11/16/2009.  Anticoagulation instructions were given to patient.  Results were reviewed/authorized by Weston Brass, PharmD.  She was notified by Weston Brass PharmD.         Prior Anticoagulation Instructions: INR 1.8 Today take 10mg s then change dose to 7.5mg s daily except 10mg s on  Sundays. Recheck in 2 weeks.   Current Anticoagulation Instructions: INR 2.6  Continue same dose of 1 1/2 tablets every day except 2 tablets on Sunday

## 2010-07-05 NOTE — Medication Information (Signed)
Summary: rov/sl  Anticoagulant Therapy  Managed by: Weston Brass, PharmD PCP: Newt Lukes MD Supervising MD: Juanda Chance MD, Bruce Indication 1: Pulmonary Embolism Lab Used: [facility] [place] Site: [address] INR POC 2.7 INR RANGE 2.0-3.0  Dietary changes: yes       Details: Has had herbal teas recently because of cold (echinacea and gypsy tea)  Health status changes: no    Bleeding/hemorrhagic complications: no    Recent/future hospitalizations: no    Any changes in medication regimen? no    Recent/future dental: no  Any missed doses?: no       Is patient compliant with meds? yes       Allergies: No Known Drug Allergies  Anticoagulation Management History:      The patient is taking warfarin and comes in today for a routine follow up visit.  Negative risk factors for bleeding include an age less than 61 years old.  The bleeding index is 'low risk'.  Negative CHADS2 values include Age > 79 years old.  Anticoagulation responsible Anesa Fronek: Juanda Chance MD, Smitty Cords.  INR POC: 2.7.  Cuvette Lot#: [id number].  Exp: 04/2011.    Anticoagulation Management Assessment/Plan:      The patient's current anticoagulation dose is Coumadin 5 mg tabs: take as directed.  The target INR is 2.0-3.0.  The next INR is due 03/21/2010.  Anticoagulation instructions were given to patient.  Results were reviewed/authorized by Weston Brass, PharmD.  She was notified by Harrel Carina, PharmD candidate.         Prior Anticoagulation Instructions: INR 2.6  Continue taking Coumadin 2 tabs (10 mg) on Sun and Wed. and Coumadin 1.5 tabs (7.5 mg) on all other days.  Return to clinic in 4 weeks.   Current Anticoagulation Instructions: INR 2.7  Continue taking 1 1/2 tablets everyday except take 2 tablets on Sundays and Wednesdays. Re-check INR in 4 weeks.

## 2010-07-05 NOTE — Letter (Signed)
Summary: [facility]  [facility]   Imported By: Sherian Rein 04/11/2010 10:05:21  _____________________________________________________________________  External Attachment:    Type:   Image     Comment:   External Document

## 2010-07-05 NOTE — Letter (Signed)
Summary: Handout Printed  Printed Handout:  - Coumadin Instructions-w/out Meds 

## 2010-07-05 NOTE — Assessment & Plan Note (Signed)
Summary: 3-4 MTH FU  STC   Vital Signs:  Patient profile:   35 year old female Height:      58 inches (147.32 cm) Weight:      244.8 pounds (111.27 kg) O2 Sat:      98 % on Room air Temp:     98.3 degrees F (36.83 degrees C) oral Pulse rate:   90 / minute BP sitting:   110 / 82  (left arm) Cuff size:   large  Vitals Entered By: Orlan Leavens (December 13, 2009 1:20 PM)  O2 Flow:  Room air CC: 3 month follow-up Is Patient Diabetic? No Pain Assessment Patient in pain? no        Primary Care Provider:  Newt Lukes MD  CC:  3 month follow-up.  History of Present Illness: here for 4 month followup-  1) bilateral PE late 01/2009 -hosp for same 9/1-9/6 at [facility] prior to that dx been on estrogen tx for PCOS (hosp summary, labs and CT results reviewed) no evidence for hypercoag state on lab panel - has been following with heme ([facility]inson)  also [facility] for coumadin mgmt  no bleeding or bruising problems  2) PCOS - follows now with dr. Pennie Rushing (gyn) plans for "induction" bleeding are being negotiated with heme given anticoag issues -  "no go" for any hormone tx no hx abn PAP - still uncomfotable with heavy bleeding  3) obesity - reports weight has been creeping up since PE dx d/t resitricted activity does not follow diet or exercise program - no known complications related to obesity (chol ok in hosp, no DM, no arthritis)  4) edema -  improved from this spring - if occurs, will happen at end of day - tight swelling both ankles and feet, resolve with elvation and better each AM - no asymmetry swellling - no SOB reviewd prior echo 10/2009 - normal   5) OSA - sleep study done 11/2009 - now seeing pulm for same - ?start CPAP vs plates at [facility]    Clinical Review Panels:  Lipid Management   Cholesterol:  179 (02/01/2009)   LDL (bad choesterol):  82 (02/01/2009)   HDL (good cholesterol):  74 (02/01/2009)   Triglycerides:  115 (02/01/2009)  CBC   WBC:  6.6  (02/06/2009)   RBC:  4.17 (02/06/2009)   Hgb:  11.4 (02/06/2009)   Hct:  33.2 (02/06/2009)   Platelets:  267 (02/05/2009)   MCV  79.6 (02/06/2009)   RDW  14.3 (02/06/2009)  Complete Metabolic Panel   Glucose:  94 (02/06/2009)   Sodium:  138 (02/06/2009)   Potassium:  4.4 (02/06/2009)   Chloride:  104 (02/06/2009)   CO2:  22 (02/06/2009)   BUN:  9 (02/06/2009)   Creatinine:  0.64 (02/06/2009)   Albumin:  2.9 (02/06/2009)   Total Protein:  6.7 (02/06/2009)   Calcium:  9.0 (02/06/2009)   Total Bili:  0.4 (02/06/2009)   Alk Phos:  88 (02/06/2009)   SGPT (ALT):  23 (02/06/2009)   SGOT (AST):  20 (02/06/2009)   Current Medications (verified): 1)  Iron 325 (65 Fe) Mg Tabs (Ferrous Sulfate) .... Take 1 Two Times A Day 2)  Coumadin 5 Mg Tabs (Warfarin Sodium) .... Take As Directed 3)  Hydrocodone-Acetaminophen 5-500 Mg Tabs (Hydrocodone-Acetaminophen) .... Take 1-2 By Mouth Q 6 Hours Prn 4)  [employer] Geophysicist/field seismologist (Respiratory Therapy Supplies) .... Only During Flights  Allergies (verified): No Known Drug Allergies  Past History:  Past  Medical History: Anemia-iron deficiency Anticoagulation therapy Pulmonary emboli, bilateral 02/2009 PCOS  obesity OSA      - PSG 10/19/09 RDI 18  MD roster: gyn -[facility]good heme - [facility] sleep - sood   Social History: Never Smoked lives with mom and fiance in [place] house works as Insurance claims handler  Review of Systems  The patient denies chest pain, syncope, and peripheral edema.    Physical Exam  General:  obese, alert, well-developed, well-nourished, and cooperative to examination.    Lungs:  normal respiratory effort, no intercostal retractions or use of accessory muscles; normal breath sounds bilaterally - no crackles and no wheezes.    Heart:  normal rate, regular rhythm, no murmur, and no rub. BLE with trace edema.  Psych:  Oriented X3, memory intact for recent and remote, normally interactive, good eye  contact, not anxious appearing, not depressed appearing, and not agitated.      Impression & Recommendations:  Problem # 1:  PULMONARY EMBOLISM (ICD-415.19)  Her updated medication list for this problem includes:    Coumadin 5 Mg Tabs (Warfarin sodium) .Marland Kitchen... Take as directed  will cont anticoag mgmt by [facility]  -  planning cont anticoag x 12mos at this time (02/01/2010?) unless otherwise directed by heme (dr. 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  I reviewed her sleep test results with her.  I discussed how sleep apnea can affect her health.  Driving precautions were discussed.  I explained the importance of weight reduction.  Reviewed various treatment options for her sleep apnea.  She will check with her insurance company to see if they will cover an oral appliance.  If not, then will proceed with auto-CPAP set up.  Problem # 4:  OBESITY (ICD-278.00)  Discussed options about how to achieve better weight control.  Ht: 58 (12/13/2009)   Wt: 244.8 (12/13/2009)   BMI: 51.60 (11/14/2009)  Complete Medication List: 1)  Iron 325 (65 Fe) Mg Tabs (Ferrous sulfate) .... Take 1 two times a day 2)  Coumadin 5 Mg Tabs (Warfarin sodium) .... Take as directed 3)  Hydrocodone-acetaminophen 5-500 Mg Tabs (Hydrocodone-acetaminophen) .... Take 1-2 by mouth q 6 hours prn 4)  [employer] (Respiratory therapy supplies) .... Only during flights  Patient Instructions: 1)  it was good to see you today. 2)  no medication change recommended today - refill on vicodin as discussed 3)  Please  schedule a follow-up appointment in 4 months to review, sooner if problems.  Prescriptions: HYDROCODONE-ACETAMINOPHEN 5-500 MG TABS (HYDROCODONE-ACETAMINOPHEN) take 1-2 by mouth q 6 hours prn  #30 x 1   Entered and Authorized by:   Newt Lukes MD   Signed by:   Newt Lukes MD on 12/13/2009   Method used:   Print then Give to Patient   RxID:   [id number]

## 2010-07-05 NOTE — Progress Notes (Signed)
Summary: Rx refill  Phone Note Call from Patient Call back at Home Phone [phone]   Caller: Patient Summary of Call: pt called requesting MD refill her Warfarin 5mg  and Iron 325mg . pt was Rx'd these by previous MD. [facility] [address]. Initial call taken by: Margaret Pyle, CMA,  August 22, 2009 4:37 PM  Follow-up for Phone Call        ok this time - but CC generally manages refills on warfarin for the pts they follow - thanks Follow-up by: Newt Lukes MD,  August 22, 2009 4:49 PM  Additional Follow-up for Phone Call Additional follow up Details #1::        pt informed via VM, also advised to contact CC for further refills of Coumadin Additional Follow-up by: Margaret Pyle, CMA,  August 23, 2009 8:55 AM    Prescriptions: IRON 325 (65 FE) MG TABS (FERROUS SULFATE) take 1 two times a day  #60 x 6   Entered and Authorized by:   Newt Lukes MD   Signed by:   Newt Lukes MD on 08/22/2009   Method used:   Electronically to        [id number] (retail)       [address]       [place], [place]  [address]       Ph: [phone]       Fax: [phone]   RxID:   [id number] COUMADIN 5 MG TABS (WARFARIN SODIUM) take as directed  #60 x 3   Entered and Authorized by:   Newt Lukes MD   Signed by:   Newt Lukes MD on 08/22/2009   Method used:   Electronically to        [id number] (retail)       [address]       [place], [place]  [address]       Ph: [phone]       Fax: [phone]   RxID:   [id number]

## 2010-07-05 NOTE — Medication Information (Signed)
Summary: rov/ez  Anticoagulant Therapy  Managed by: Bethena Midget, RN, BSN PCP: Newt Lukes MD Supervising MD: Johney Frame MD, Fayrene Fearing Indication 1: Pulmonary Embolism Lab Used: [facility] [place] Site: [address] INR POC 2.0 INR RANGE 2.0-3.0  Dietary changes: no    Health status changes: no    Bleeding/hemorrhagic complications: no    Recent/future hospitalizations: no    Any changes in medication regimen? no    Recent/future dental: no  Any missed doses?: no       Is patient compliant with meds? yes       Current Medications (verified): 1)  Iron 325 (65 Fe) Mg Tabs (Ferrous Sulfate) .... Take 1 Two Times A Day 2)  Coumadin 5 Mg Tabs (Warfarin Sodium) .... Take As Directed 3)  Hydrocodone-Acetaminophen 5-500 Mg Tabs (Hydrocodone-Acetaminophen) .... Take 1-2 By Mouth Q 6 Hours Prn  Allergies (verified): No Known Drug Allergies  Anticoagulation Management History:      The patient is taking warfarin and comes in today for a routine follow up visit.  Negative risk factors for bleeding include an age less than 36 years old.  The bleeding index is 'low risk'.  Negative CHADS2 values include Age > 97 years old.  Anticoagulation responsible provider: Bricia Taher MD, Fayrene Fearing.  INR POC: 2.0.  Cuvette Lot#: [id number].  Exp: 09/2010.    Anticoagulation Management Assessment/Plan:      The patient's current anticoagulation dose is Coumadin 5 mg tabs: take as directed.  The target INR is 2.0-3.0.  The next INR is due 08/16/2009.  Anticoagulation instructions were given to patient.  Results were reviewed/authorized by Bethena Midget, RN, BSN.  She was notified by Bethena Midget, RN, BSN.         Prior Anticoagulation Instructions: INR: 2.8 Continue with same dosage of 7.5mg  daily except 5mg  on Tuesdays Recheck in 4 weeks  Current Anticoagulation Instructions: INR 2.0 Today take 10mg s. Then resume 7.5mg s daily except 5mg s on Tuesdays. Recheck in 4 weeks.

## 2010-07-05 NOTE — Assessment & Plan Note (Signed)
Summary: 4-6 MOS F/U / #CD   Vital Signs:  Patient profile:   35 year old female Height:      58 inches (147.32 cm) Weight:      241.0 pounds (109.55 kg) O2 Sat:      96 % on Room air Temp:     98.4 degrees F (36.89 degrees C) oral Pulse rate:   80 / minute  Vitals Entered By: Orlan Leavens (September 12, 2009 11:09 AM)  O2 Flow:  Room air  Primary Care Provider:  Newt Lukes MD   History of Present Illness: here for 4 month followup-  1) bilateral PE late 01/2009 -hosp for same 9/1-9/6 at [facility] prior to that dx been on estrogen tx for PCOS (hosp summary, labs and CT results reviewed) no evidence for hypercoag state on lab panel - has been following with heme ([facility]rinson)  also [facility] for coumadin mgmt  no bleeding or bruising problems  2) PCOS - follows now with dr. Pennie Rushing (gyn) plans for "induction" bleeding are being negotiated with heme given anticoag issues -  "no go" for any hormone tx no hx abn PAP - still uncomfotable with heavy bleeding  3) obesity - reports weight has been creeping up since PE dx d/t resitricted activity does not follow diet or exercise program - no known complications related to obesity (chol ok in hosp, no DM, no arthritis)  4) edema -  worse in last 3 months and at end of day - tight swelling both ankles and feet, resolve with elvation and better each AM - no asymmetry swellling - no SOB denies any echo eval    Current Medications (verified): 1)  Iron 325 (65 Fe) Mg Tabs (Ferrous Sulfate) .... Take 1 Two Times A Day 2)  Coumadin 5 Mg Tabs (Warfarin Sodium) .... Take As Directed 3)  Hydrocodone-Acetaminophen 5-500 Mg Tabs (Hydrocodone-Acetaminophen) .... Take 1-2 By Mouth Q 6 Hours Prn  Allergies (verified): No Known Drug Allergies  Past History:  Past Medical History: Anemia-iron deficiency Anticoagulation therapy Pulmonary emboli, bilateral 02/2009 PCOS  obesity  MD rooster: gyn -[facility] - [facility]  Review of  Systems       The patient complains of peripheral edema.  The patient denies fever, chest pain, syncope, and dyspnea on exertion.         c/o 2-3 hours interrupted sleep per night  Physical Exam  General:  obese, alert, well-developed, well-nourished, and cooperative to examination.    Lungs:  normal respiratory effort, no intercostal retractions or use of accessory muscles; normal breath sounds bilaterally - no crackles and no wheezes.    Heart:  normal rate, regular rhythm, no murmur, and no rub. BLE with 1+ edema.  Psych:  Oriented X3, memory intact for recent and remote, normally interactive, good eye contact, not anxious appearing, not depressed appearing, and not agitated.      Impression & Recommendations:  Problem # 1:  PULMONARY EMBOLISM (ICD-415.19)  Her updated medication list for this problem includes:    Coumadin 5 Mg Tabs (Warfarin sodium) .Marland Kitchen... Take as directed  Orders: Echo Referral (Echo) Sleep Disorder Referral (Sleep Disorder)  will cont anticoag mgmt by [facility] as requested -  planning cont anticoag x 12mos at this time (02/01/2010?) unless otherwise directed by heme (dr. Arline Asp) - next visit 11/2009 to decide ?chronic PE contrib to RV strain and subsquent increase in edema? -  will arrange echo and also sleep eval for ?OSA/OHA  Reviewed the  following: Coumadin Dose (weekly): 52.50 mg (09/06/2009) Prior Coumadin Dose (weekly): 52.50 mg (09/06/2009) Next Protime: 10/04/2009 (dated on 09/06/2009)  Problem # 2:  OBESITY (ICD-278.00) 6 lb weght gain - ?edema fluid retention vs poor diet... ?OSA contrib to ?RV strain with ?LV dysfx - echo and sleep eval for same  Orders: Sleep Disorder Referral (Sleep Disorder)  needs to resume regular activity and add exercise program -  ok to do even with dx PE 02/01/09 as now stable - pt reassured again  Ht: 58 (04/07/2009)   Wt: 235.2 (04/07/2009)   BMI: 49.33 (04/07/2009)  Ht: 58 (09/12/2009)   Wt: 241.0 (09/12/2009)    BMI: 49.33 (04/07/2009)  Problem # 3:  EDEMA (ICD-782.3) see above discussions Orders: Echo Referral (Echo) Sleep Disorder Referral (Sleep Disorder)  Problem # 4:  POLYCYSTIC OVARIAN DISEASE (ICD-256.4)  off hormone tx due to PE dx - mgmt per gyn (haygood)  Complete Medication List: 1)  Iron 325 (65 Fe) Mg Tabs (Ferrous sulfate) .... Take 1 two times a day 2)  Coumadin 5 Mg Tabs (Warfarin sodium) .... Take as directed 3)  Hydrocodone-acetaminophen 5-500 Mg Tabs (Hydrocodone-acetaminophen) .... Take 1-2 by mouth q 6 hours prn  Patient Instructions: 1)  it was good to see you today. 2)  no medication change recommended today - 3)  we'll make referral to sleep doctors and for echocardiogram to look at heart function. Our office will contact you regarding this appointment once made.  4)  Please schedule a follow-up appointment in 3-4 months to review tests and symptoms, sooner if problems.

## 2010-07-05 NOTE — Medication Information (Signed)
Summary: rov/tm  Anticoagulant Therapy  Managed by: Weston Brass, PharmD PCP: Newt Lukes MD Supervising MD: Graciela Husbands MD, Viviann Spare Indication 1: Pulmonary Embolism Lab Used: [facility] [place] Site: [address] INR POC 2.6 INR RANGE 2.0-3.0  Dietary changes: no    Health status changes: no    Bleeding/hemorrhagic complications: no    Recent/future hospitalizations: no    Any changes in medication regimen? no    Recent/future dental: no  Any missed doses?: no       Is patient compliant with meds? yes       Allergies: No Known Drug Allergies  Anticoagulation Management History:      The patient is taking warfarin and comes in today for a routine follow up visit.  Negative risk factors for bleeding include an age less than 75 years old.  The bleeding index is 'low risk'.  Negative CHADS2 values include Age > 85 years old.  Anticoagulation responsible provider: Graciela Husbands MD, Viviann Spare.  INR POC: 2.6.  Exp: 01/2011.    Anticoagulation Management Assessment/Plan:      The patient's current anticoagulation dose is Coumadin 5 mg tabs: take as directed.  The target INR is 2.0-3.0.  The next INR is due 01/11/2010.  Anticoagulation instructions were given to patient.  Results were reviewed/authorized by Weston Brass, PharmD.  She was notified by Dillard Cannon.         Prior Anticoagulation Instructions: INR 2.1 Today take 10mg s then Continue 7.5mg s everyday except 10mg s on Sundays. Recheck in 4 weeks.   Current Anticoagulation Instructions: INR 2.6  Continue same regimen of 2 tabs on Sunday and 1.5 tabs all other days.  Re-check INR in 4 weeks.

## 2010-07-05 NOTE — Medication Information (Signed)
Summary: rov/sp  Anticoagulant Therapy  Managed by: Reina Fuse, PharmD PCP: Newt Lukes MD Supervising MD: Johney Frame MD, Fayrene Fearing Indication 1: Pulmonary Embolism Lab Used: [facility] [place] Site: [address] INR POC 2.6 INR RANGE 2.0-3.0  Dietary changes: no    Health status changes: no    Bleeding/hemorrhagic complications: no    Recent/future hospitalizations: no    Any changes in medication regimen? no    Recent/future dental: no  Any missed doses?: no       Is patient compliant with meds? yes       Allergies: No Known Drug Allergies  Anticoagulation Management History:      The patient is taking warfarin and comes in today for a routine follow up visit.  Negative risk factors for bleeding include an age less than 64 years old.  The bleeding index is 'low risk'.  Negative CHADS2 values include Age > 29 years old.  Anticoagulation responsible provider: Allred MD, Fayrene Fearing.  INR POC: 2.6.  Cuvette Lot#: [id number].  Exp: 03/2011.    Anticoagulation Management Assessment/Plan:      The patient's current anticoagulation dose is Coumadin 5 mg tabs: take as directed.  The target INR is 2.0-3.0.  The next INR is due 02/21/2010.  Anticoagulation instructions were given to patient.  Results were reviewed/authorized by Reina Fuse, PharmD.  She was notified by Reina Fuse PharmD.         Prior Anticoagulation Instructions: INR 2.6  Take 2 tablets today, then begin taking 1.5 tablets daily except 2 tablets Sun and Wed.  Return to clinic in 2 weeks.  Current Anticoagulation Instructions: INR 2.6  Continue taking Coumadin 2 tabs (10 mg) on Sun and Wed. and Coumadin 1.5 tabs (7.5 mg) on all other days.  Return to clinic in 4 weeks.

## 2010-07-05 NOTE — Medication Information (Signed)
Summary: rov/tm  Anticoagulant Therapy  Managed by: Bethena Midget, RN, BSN PCP: Newt Lukes MD Supervising MD: Johney Frame MD, Fayrene Fearing Indication 1: Pulmonary Embolism Lab Used: [facility] [place] Site: [address] INR POC 1.9 INR RANGE 2.0-3.0  Dietary changes: yes       Details: Has eating sl. more green leafy veggies.    Health status changes: no    Bleeding/hemorrhagic complications: no    Recent/future hospitalizations: no    Any changes in medication regimen? yes       Details: Pt. started Fish oil daily  Recent/future dental: no  Any missed doses?: no       Is patient compliant with meds? yes       Allergies (verified): No Known Drug Allergies  Anticoagulation Management History:      The patient is taking warfarin and comes in today for a routine follow up visit.  Negative risk factors for bleeding include an age less than 59 years old.  The bleeding index is 'low risk'.  Negative CHADS2 values include Age > 24 years old.  Anticoagulation responsible provider: Vasilis Luhman MD, Fayrene Fearing.  INR POC: 1.9.  Cuvette Lot#: [id number].  Exp: 10/2010.    Anticoagulation Management Assessment/Plan:      The patient's current anticoagulation dose is Coumadin 5 mg tabs: take as directed.  The target INR is 2.0-3.0.  The next INR is due 09/06/2009.  Anticoagulation instructions were given to patient.  Results were reviewed/authorized by Bethena Midget, RN, BSN.  She was notified by Bethena Midget, RN, BSN.         Prior Anticoagulation Instructions: INR 2.0 Today take 10mg s. Then resume 7.5mg s daily except 5mg s on Tuesdays. Recheck in 4 weeks.   Current Anticoagulation Instructions: INR 1.9  Take 2 tabs today, then increase to 1.5 tabs daily.  Recheck in 3 weeks.

## 2010-07-05 NOTE — Medication Information (Signed)
Summary: rov/ln  Anticoagulant Therapy  Managed by: Weston Brass, PharmD PCP: Newt Lukes MD Supervising MD: Excell Seltzer MD, Casimiro Needle Indication 1: Pulmonary Embolism Lab Used: [facility] [place] Site: [address] INR POC 1.6 INR RANGE 2.0-3.0  Dietary changes: yes       Details: Has been eating a lot more green leafy vegetables due to diet.  Health status changes: no    Bleeding/hemorrhagic complications: no    Recent/future hospitalizations: no    Any changes in medication regimen? no    Recent/future dental: no  Any missed doses?: no       Is patient compliant with meds? yes       Allergies: No Known Drug Allergies  Anticoagulation Management History:      The patient is taking warfarin and comes in today for a routine follow up visit.  Negative risk factors for bleeding include an age less than 31 years old.  The bleeding index is 'low risk'.  Negative CHADS2 values include Age > 37 years old.  Anticoagulation responsible provider: Excell Seltzer MD, Casimiro Needle.  INR POC: 1.6.  Cuvette Lot#: [id number].  Exp: 03/2011.    Anticoagulation Management Assessment/Plan:      The patient's current anticoagulation dose is Coumadin 5 mg tabs: take as directed.  The target INR is 2.0-3.0.  The next INR is due 01/25/2010.  Anticoagulation instructions were given to patient.  Results were reviewed/authorized by Weston Brass, PharmD.  She was notified by Liana Gerold, PharmD Candidate.         Prior Anticoagulation Instructions: INR 2.6  Continue same regimen of 2 tabs on Sunday and 1.5 tabs all other days.  Re-check INR in 4 weeks.  Current Anticoagulation Instructions: INR 2.6  Take 2 tablets today, then begin taking 1.5 tablets daily except 2 tablets Sun and Wed.  Return to clinic in 2 weeks.

## 2010-07-05 NOTE — Assessment & Plan Note (Signed)
Summary: ?osa, ?chronic PE/apc   Copy to:  Newt Lukes MD Primary Provider/Referring Provider:  Newt Lukes MD  CC:  Sleep consult and pt states since she has had blood clots in both of her lungs she can only sleep 2-4 hours a night and she feels like she can't breath..  History of Present Illness: 35 yo female for evaluation of sleep problems.  She has always had a problem with her sleep.  This has gotten worse over the past one year since she gained about 20 lbs.  She tries to go to sleep earlier, but can't fall asleep until 3 am.  She wakes up several times during the night with a choking sensation.  She does snore, and has been told that she stops breathing while asleep.  She can't sleep on her back.  She gets out of bed at noon, but still feels tired.  She also gets frequent headaches during the day.  She does talk in her sleep, and occasionally grinds her teeth.  She denies sleep walking or nightmares.  She has been getting a numb feeling in her legs, but this can happen at anytime.  She denies restless legs.  There is no history of sleep hallucinations, sleep paralysis, or cataplexy.  She drinks two cups of coffee in the morning.  She is not using anything to help her sleep.  She gets anxious at times, but denies depression  Epworth score is 15 out of 24.   Current Medications (verified): 1)  Iron 325 (65 Fe) Mg Tabs (Ferrous Sulfate) .... Take 1 Two Times A Day 2)  Coumadin 5 Mg Tabs (Warfarin Sodium) .... Take As Directed 3)  Hydrocodone-Acetaminophen 5-500 Mg Tabs (Hydrocodone-Acetaminophen) .... Take 1-2 By Mouth Q 6 Hours Prn 4)  [employer] Geophysicist/field seismologist (Respiratory Therapy Supplies) .... 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 PCOS  obesity  MD rooster: gyn -[facility] -  [facility]  Social History: Last updated: 04/07/2009 Never Smoked lives with mom and fiance in [place] house works as Insurance claims handler  Past Surgical History: Removal of fibriods- 1997  Family History: Family History Diabetes 1st degree relative (grandparent, other relative) Family History Emphysema PGF Allergies Father Family History Asthma PGM Pacemaker PGM  Review of Systems       The patient complains of shortness of breath with activity, shortness of breath at rest, non-productive cough, loss of appetite, weight change, headaches, nasal congestion/difficulty breathing through nose, and hand/feet swelling.  The patient denies productive cough, coughing up blood, chest pain, irregular heartbeats, acid heartburn, indigestion, abdominal pain, difficulty swallowing, sore throat, tooth/dental problems, sneezing, itching, ear ache, anxiety, depression, joint stiffness or pain, rash, change in color of mucus, and fever.    Vital Signs:  Patient profile:   35 year old female Height:      58 inches Weight:      294 pounds O2 Sat:      97 % on Room air Temp:     98.3 degrees F oral Pulse rate:   84 / minute BP sitting:   118 / 76  (left arm) Cuff size:   large  Vitals Entered By: Michel Bickers CMA (Oct 09, 2009 10:44 AM)  O2 Flow:  Room air CC: Sleep consult, pt states since she has had blood clots in both of her lungs she can  only sleep 2-4 hours a night and she feels like she can't breath. Comments Meds and allergies updated Michel Bickers CMA  Oct 09, 2009 10:44 AM    Physical Exam  General:  normal appearance, healthy appearing, and obese.   Eyes:  PERRLA and EOMI, wears glasses Nose:  no deformity, discharge, inflammation, or lesions Mouth:  MP 4, enlarged tongue, 2+ tonsills Neck:  no JVD.   Chest Wall:  no deformities noted Lungs:  clear bilaterally to auscultation and percussion Heart:  regular rate and rhythm, S1, S2 without murmurs, rubs, gallops, or  clicks Abdomen:  bowel sounds positive; abdomen soft and non-tender without masses, or organomegaly Msk:  no deformity or scoliosis noted with normal posture Pulses:  pulses normal Extremities:  no clubbing, cyanosis, edema, or deformity noted Neurologic:  CN II-XII grossly intact with normal reflexes, coordination, muscle strength and tone Cervical Nodes:  no significant adenopathy Psych:  anxious.     Impression & Recommendations:  Problem # 1:  HYPERSOMNIA (ICD-780.54) She has symptoms and physical findings suggestive of sleep apnea.  She has sleep disruption with daytime hypersomnolence.  I am concerned that she may have sleep apnea.  I have discussed how sleep apnea can affect her health.  Driving precautions were reviewed.  I explained how her weight can be affecting her sleep.  To further assess this will arrange for an overnight sleep study.  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  Problem # 3:  OBESITY (ICD-278.00) Discussed options about how to achieve better weight control.  Medications Added to Medication List This Visit: 1)  [employer] (Respiratory therapy supplies) .... Only during flights  Complete Medication List: 1)  Iron 325 (65 Fe) Mg Tabs (Ferrous sulfate) .... Take 1 two times a day 2)  Coumadin 5 Mg Tabs (Warfarin sodium) .... Take as directed 3)  Hydrocodone-acetaminophen 5-500 Mg Tabs (Hydrocodone-acetaminophen) .... Take 1-2 by mouth q 6 hours prn 4)  [employer] (Respiratory therapy supplies) .... Only during flights  Other Orders: DME Referral (DME) Consultation Level IV (16109)  Patient Instructions: 1)  Will schedule sleep test  2)  Will call to schedule follow up after sleep test is reviewed

## 2010-07-05 NOTE — Medication Information (Signed)
Summary: rov/tm  Anticoagulant Therapy  Managed by: Shelby Dubin, PharmD, BCPS, CPP PCP: Newt Lukes MD Supervising MD: Eden Emms MD, Theron Arista Indication 1: Pulmonary Embolism Lab Used: [facility] [place] Site: [address] INR POC 2.8 INR RANGE 2.0-3.0  Dietary changes: no    Health status changes: no    Bleeding/hemorrhagic complications: no    Recent/future hospitalizations: no    Any changes in medication regimen? no    Recent/future dental: no  Any missed doses?: no       Is patient compliant with meds? yes       Allergies (verified): No Known Drug Allergies  Anticoagulation Management History:      The patient is taking warfarin and comes in today for a routine follow up visit.  Negative risk factors for bleeding include an age less than 17 years old.  The bleeding index is 'low risk'.  Negative CHADS2 values include Age > 56 years old.  Anticoagulation responsible provider: Eden Emms MD, Theron Arista.  INR POC: 2.8.  Cuvette Lot#: [id number].  Exp: 09/2010.    Anticoagulation Management Assessment/Plan:      The patient's current anticoagulation dose is Coumadin 5 mg tabs: take as directed.  The target INR is 2.0-3.0.  The next INR is due 07/19/2009.  Anticoagulation instructions were given to patient.  Results were reviewed/authorized by Shelby Dubin, PharmD, BCPS, CPP.  She was notified by Ysidro Evert, Pharm D Candidate.         Prior Anticoagulation Instructions: INR 2.4 Continue 7.5mg s everyday except 5mg s on Tuesdays. Recheck in 4 weeks.   Current Anticoagulation Instructions: INR: 2.8 Continue with same dosage of 7.5mg  daily except 5mg  on Tuesdays Recheck in 4 weeks

## 2010-07-05 NOTE — Medication Information (Signed)
Summary: [name]  Anticoagulant Therapy  Managed by: Bethena Midget, RN, BSN PCP: Newt Lukes MD Supervising MD: Graciela Husbands MD, Viviann Spare Indication 1: Pulmonary Embolism Lab Used: [facility] [place] Site: [address] INR POC 2.8 INR RANGE 2.0-3.0  Dietary changes: no    Health status changes: no    Bleeding/hemorrhagic complications: no    Recent/future hospitalizations: no    Any changes in medication regimen? no    Recent/future dental: no  Any missed doses?: no       Is patient compliant with meds? yes       Allergies: No Known Drug Allergies  Anticoagulation Management History:      The patient is taking warfarin and comes in today for a routine follow up visit.  Negative risk factors for bleeding include an age less than 67 years old.  The bleeding index is 'low risk'.  Negative CHADS2 values include Age > 23 years old.  Anticoagulation responsible provider: Graciela Husbands MD, Viviann Spare.  INR POC: 2.8.  Cuvette Lot#: [id number].  Exp: 04/2011.    Anticoagulation Management Assessment/Plan:      The patient's current anticoagulation dose is Coumadin 5 mg tabs: take as directed.  The target INR is 2.0-3.0.  The next INR is due 04/18/2010.  Anticoagulation instructions were given to patient.  Results were reviewed/authorized by Bethena Midget, RN, BSN.  She was notified by Bethena Midget, RN, BSN.         Prior Anticoagulation Instructions: INR 2.7  Continue taking 1 1/2 tablets everyday except take 2 tablets on Sundays and Wednesdays. Re-check INR in 4 weeks.   Current Anticoagulation Instructions: INR 2.8 Continue 7.5mg s daily except 10mg s on Sundays and Wednesdays. Recheck in 4 weeks.

## 2010-07-05 NOTE — Miscellaneous (Signed)
Summary: Polysomnogram report  Clinical Lists Changes Mild to moderate OSA with AHI 8, RDI 18.    Will have my nurse call to schedule next available ROV to review results.  Appended Document: Polysomnogram report LMOMTCB.  Appended Document: Polysomnogram report Pt scheduled ROV with VS on 11/01/09 for 11/14/09 with the schedulers.  Will sign off on append.

## 2010-07-05 NOTE — Medication Information (Signed)
Summary: rov/nb  Anticoagulant Therapy  Managed by: Leota Sauers, PharmD, BCPS, CPP Referring MD: Gwendolyn Fill PCP: Newt Lukes MD Supervising MD: Ladona Ridgel MD, Sharlot Gowda Indication 1: Pulmonary Embolism Lab Used: [facility] [place] Site: [address] INR POC 2.6 INR RANGE 2.0-3.0  Dietary changes: no    Health status changes: no    Bleeding/hemorrhagic complications: no    Recent/future hospitalizations: no    Any changes in medication regimen? no    Recent/future dental: no  Any missed doses?: no       Is patient compliant with meds? yes       Current Medications (verified): 1)  Iron 325 (65 Fe) Mg Tabs (Ferrous Sulfate) .... Take 1 Two Times A Day 2)  Coumadin 5 Mg Tabs (Warfarin Sodium) .... Take As Directed 3)  Hydrocodone-Acetaminophen 5-500 Mg Tabs (Hydrocodone-Acetaminophen) .... Take 1-2 By Mouth Q 6 Hours Prn 4)  [employer] Geophysicist/field seismologist (Respiratory Therapy Supplies) .... Only During Flights  Allergies (verified): No Known Drug Allergies  Anticoagulation Management History:      The patient is taking warfarin and comes in today for a routine follow up visit.  Negative risk factors for bleeding include an age less than 37 years old.  The bleeding index is 'low risk'.  Negative CHADS2 values include Age > 62 years old.  Anticoagulation responsible provider: Ladona Ridgel MD, Sharlot Gowda.  INR POC: 2.6.  Cuvette Lot#: [id number].  Exp: 04/2011.    Anticoagulation Management Assessment/Plan:      The patient's current anticoagulation dose is Coumadin 5 mg tabs: take as directed.  The target INR is 2.0-3.0.  The next INR is due 06/06/2010.  Anticoagulation instructions were given to patient.  Results were reviewed/authorized by Leota Sauers, PharmD, BCPS, CPP.         Prior Anticoagulation Instructions: INR 3.3 Only take 1 tablet today then resume previous dose of 1.5 tablets everyday except 2 tablets on Sunday and Wednesday Recheck INR in 3  weeks    Current Anticoagulation Instructions: INR 2.6  Coumadin 5mg  tab take 1.5tab each day except 2 tabs on SUN and WED

## 2010-07-05 NOTE — Assessment & Plan Note (Signed)
Summary: 4 MO ROV /NWS   Vital Signs:  Patient profile:   35 year old female Height:      58 inches (147.32 cm) Weight:      240.0 pounds (109.09 kg) O2 Sat:      97 % on Room air Temp:     98.3 degrees F (36.83 degrees C) oral Pulse rate:   80 / minute BP sitting:   112 / 72  (left arm) Cuff size:   large  Vitals Entered By: Orlan Leavens RMA (April 18, 2010 2:29 PM)  O2 Flow:  Room air CC: 4 month follow-up Is Patient Diabetic? No Pain Assessment Patient in pain? no        Primary Care Provider:  Newt Lukes MD  CC:  4 month follow-up.  History of Present Illness: here for 4 month followup-  1) bilateral PE late 01/2009 -hosp for same 9/1-9/6 at [facility] prior to that dx been on estrogen tx nuvaring for PCOS (hosp summary, labs and CT results reviewed) no evidence for hypercoag state on lab panel - has been following with heme ([facility]) - f/u 06/2010 to see if can go off coumadin also [facility] for coumadin mgmt  no bleeding or bruising problems  2) PCOS - follows now with dr. Pennie Rushing (gyn) plans for "induction" bleeding are being negotiated with heme given anticoag issues -  "no go" for any hormone tx no hx abn PAP - still uncomfotable with heavy bleeding  3) obesity - reports weight has been stable since PE dx d/t resitricted activity does not follow diet or exercise program - no known complications related to obesity (chol ok in hosp, no DM, no arthritis)  4) edema -  improved from this spring - if occurs, will happen at end of day - tight swelling both ankles and feet, resolve with elvation and better each AM - no asymmetry swellling - no SOB reviewd prior echo 10/2009 - normal   5) OSA - sleep study done 11/2009 - now seeing pulm for same - ?start CPAP vs plates at [facility]  would like to be cahecked for celiac dz -  off gluten x 4 months and feels better - less abd pain and less fatigue  Clinical Review Panels:  Immunizations   Last Tetanus Booster:   Tdap (04/07/2009)   Last Flu Vaccine:  Fluvax 3+ (04/07/2009)  Lipid Management   Cholesterol:  179 (02/01/2009)   LDL (bad choesterol):  82 (02/01/2009)   HDL (good cholesterol):  74 (02/01/2009)   Triglycerides:  115 (02/01/2009)  CBC   WBC:  6.6 (02/06/2009)   RBC:  4.17 (02/06/2009)   Hgb:  11.4 (02/06/2009)   Hct:  33.2 (02/06/2009)   Platelets:  267 (02/05/2009)   MCV  79.6 (02/06/2009)   RDW  14.3 (02/06/2009)  Complete Metabolic Panel   Glucose:  94 (02/06/2009)   Sodium:  138 (02/06/2009)   Potassium:  4.4 (02/06/2009)   Chloride:  104 (02/06/2009)   CO2:  22 (02/06/2009)   BUN:  9 (02/06/2009)   Creatinine:  0.64 (02/06/2009)   Albumin:  2.9 (02/06/2009)   Total Protein:  6.7 (02/06/2009)   Calcium:  9.0 (02/06/2009)   Total Bili:  0.4 (02/06/2009)   Alk Phos:  88 (02/06/2009)   SGPT (ALT):  23 (02/06/2009)   SGOT (AST):  20 (02/06/2009)   Current Medications (verified): 1)  Iron 325 (65 Fe) Mg Tabs (Ferrous Sulfate) .... Take 1 Two Times A Day 2)  Coumadin 5 Mg Tabs (Warfarin Sodium) .... Take As Directed 3)  Hydrocodone-Acetaminophen 5-500 Mg Tabs (Hydrocodone-Acetaminophen) .... Take 1-2 By Mouth Q 6 Hours Prn 4)  [employer] Geophysicist/field seismologist (Respiratory Therapy Supplies) .... Only During Flights  Allergies (verified): No Known Drug Allergies  Past History:  Past Medical History: Anemia-iron deficiency Anticoagulation therapy Pulmonary emboli, bilateral 02/2009 PCOS  obesity OSA      - PSG 10/19/09 RDI 18    MD roster:  gyn -[facility] - [facility] sleep - sood   Review of Systems  The patient denies fever, weight gain, dyspnea on exertion, and headaches.         c/o sore throat last week, better now but concerned with ?infection brewing - no fever or trouble swallowing  Physical Exam  General:  obese, alert, well-developed, well-nourished, and cooperative to examination.    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    Heart:  normal rate, regular rhythm, no murmur, and no rub. BLE with trace edema.  Psych:  Oriented X3, memory intact for recent and remote, normally interactive, good eye contact, not anxious appearing, not depressed appearing, and not agitated.      Impression & Recommendations:  Problem # 1:  ABDOMINAL PAIN, GENERALIZED (ICD-789.07) check for cliace dz - even if negative, rec cont gluten free diet given improved symptoms - less bloat and less fatigue Orders: T-Celiac Disease Ab Evaluation (8002)  Problem # 2:  PULMONARY EMBOLISM (ICD-415.19)  Her updated medication list for this problem includes:    Coumadin 5 Mg Tabs (Warfarin sodium) .Marland Kitchen... Take as directed  will cont anticoag mgmt by [facility]  -  plan anticoag at least  thru 06/2010 unless otherwise directed by heme (dr. 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  Ht: 58 (12/13/2009)   Wt: 244.8 (12/13/2009)   BMI: 51.60 (11/14/2009)  Ht: 58 (04/18/2010)   Wt: 240.0 (04/18/2010)   BMI: 51.60 (11/14/2009)  Complete Medication List: 1)  Iron 325 (65 Fe) Mg Tabs (Ferrous sulfate) .... Take 1 two times a day 2)  Coumadin 5 Mg Tabs (Warfarin sodium) .... Take as directed 3)  Hydrocodone-acetaminophen 5-500 Mg Tabs (Hydrocodone-acetaminophen) .... Take 1-2 by mouth q 6 hours  prn 4)  [employer] (Respiratory therapy supplies) .... Only during flights  Patient Instructions: 1)  it was good to see you today. 2)  test(s) ordered today - your results will be posted on the phone tree for review in 48-72 hours from the time of test completion; call [phone] and enter your 9 digit MRN (listed above on this page, just below your name); if any changes need to be made or there are abnormal results, you will be contacted directly.  3)  continue gluten free diet regardless of results if you feel better on this diet 4)  call if your sore throat seems "infected" for further evaluation and treatment as needed  5)  Please schedule a follow-up appointment in 6 months to review weight and other medical issues, sooner if problems.    Orders Added: 1)  T-Celiac Disease Ab Evaluation [8002] 2)  Est. Patient Level IV [59563]

## 2010-07-11 NOTE — Medication Information (Signed)
Summary: rov/ewj  Anticoagulant Therapy  Managed by: Weston Brass, PharmD Referring MD: Gwendolyn Fill PCP: Newt Lukes MD Supervising MD: Ladona Ridgel MD, Sharlot Gowda Indication 1: Pulmonary Embolism Lab Used: [facility] [place] Site: [address] INR POC 2.6 INR RANGE 2.0-3.0  Dietary changes: no    Health status changes: no    Bleeding/hemorrhagic complications: no    Recent/future hospitalizations: no    Any changes in medication regimen? no    Recent/future dental: yes     Details: having filling soon but no need to stop Coumadin  Any missed doses?: no       Is patient compliant with meds? yes       Allergies: No Known Drug Allergies  Anticoagulation Management History:      The patient is taking warfarin and comes in today for a routine follow up visit.  Negative risk factors for bleeding include an age less than 36 years old.  The bleeding index is 'low risk'.  Negative CHADS2 values include Age > 49 years old.  Anticoagulation responsible provider: Ladona Ridgel MD, Sharlot Gowda.  INR POC: 2.6.  Cuvette Lot#: [id number].  Exp: 06/2011.    Anticoagulation Management Assessment/Plan:      The patient's current anticoagulation dose is Coumadin 5 mg tabs: take as directed.  The target INR is 2.0-3.0.  The next INR is due 08/01/2010.  Anticoagulation instructions were given to patient.  Results were reviewed/authorized by Weston Brass, PharmD.  She was notified by Weston Brass PharmD.         Prior Anticoagulation Instructions: INR 2.9 Today only take 7.5mg s Continue 7.5mg s everyday except 10mg s on Sundays and Wednesdays. Recheck in 4 weeks.   Current Anticoagulation Instructions: INR 2.6  Continue same dose of 1 1/2 tablets every day except 2 tablets on Sunday and Wednesday.  Recheck INR in 4 weeks.

## 2010-07-17 DIAGNOSIS — Z7901 Long term (current) use of anticoagulants: Secondary | ICD-10-CM | POA: Insufficient documentation

## 2010-07-17 DIAGNOSIS — I2699 Other pulmonary embolism without acute cor pulmonale: Secondary | ICD-10-CM

## 2010-07-19 NOTE — Letter (Signed)
Summary: [facility]  [facility]   Imported By: Sherian Rein 07/12/2010 09:02:32  _____________________________________________________________________  External Attachment:    Type:   Image     Comment:   External Document

## 2010-08-01 ENCOUNTER — Encounter: Payer: Self-pay | Admitting: Cardiovascular Disease

## 2010-08-01 ENCOUNTER — Encounter (INDEPENDENT_AMBULATORY_CARE_PROVIDER_SITE_OTHER): Payer: PRIVATE HEALTH INSURANCE

## 2010-08-01 DIAGNOSIS — Z7901 Long term (current) use of anticoagulants: Secondary | ICD-10-CM

## 2010-08-01 DIAGNOSIS — I2699 Other pulmonary embolism without acute cor pulmonale: Secondary | ICD-10-CM

## 2010-08-09 NOTE — Medication Information (Signed)
Summary: rov/sp  Anticoagulant Therapy  Managed by: Weston Brass, PharmD Referring MD: Gwendolyn Fill PCP: Newt Lukes MD Supervising MD: Excell Seltzer MD, Casimiro Needle Indication 1: Pulmonary Embolism Lab Used: [facility] [place] Site: [address] INR POC 2.0 INR RANGE 2.0-3.0  Dietary changes: no    Health status changes: no    Bleeding/hemorrhagic complications: no    Recent/future hospitalizations: no    Any changes in medication regimen? yes       Details: Started taking echinachea, vitamin C, and another supplement that she is unsure of the name - started 2/14  Recent/future dental: no  Any missed doses?: yes     Details: Missed one dose on Saturday night  Is patient compliant with meds? yes       Allergies: No Known Drug Allergies  Anticoagulation Management History:      The patient is taking warfarin and comes in today for a routine follow up visit.  Negative risk factors for bleeding include an age less than 16 years old.  The bleeding index is 'low risk'.  Negative CHADS2 values include Age > 31 years old.  Anticoagulation responsible provider: Excell Seltzer MD, Casimiro Needle.  INR POC: 2.0.  Cuvette Lot#: [id number].  Exp: 06/2011.    Anticoagulation Management Assessment/Plan:      The patient's current anticoagulation dose is Coumadin 5 mg tabs: take as directed.  The target INR is 2.0-3.0.  The next INR is due 08/29/2010.  Anticoagulation instructions were given to patient.  Results were reviewed/authorized by Weston Brass, PharmD.  She was notified by Margot Chimes PharmD Candidate.         Prior Anticoagulation Instructions: INR 2.6  Continue same dose of 1 1/2 tablets every day except 2 tablets on Sunday and Wednesday.  Recheck INR in 4 weeks.   Current Anticoagulation Instructions: INR 2.0  Continue to take 1.5 tablets daily except on Sundays and Wednesdays when you take 2 tablets.  Recheck INR in 4 weeks.

## 2010-08-15 ENCOUNTER — Encounter: Payer: Self-pay | Admitting: Internal Medicine

## 2010-08-15 ENCOUNTER — Ambulatory Visit (INDEPENDENT_AMBULATORY_CARE_PROVIDER_SITE_OTHER): Payer: PRIVATE HEALTH INSURANCE | Admitting: Internal Medicine

## 2010-08-15 DIAGNOSIS — J019 Acute sinusitis, unspecified: Secondary | ICD-10-CM

## 2010-08-15 DIAGNOSIS — Z23 Encounter for immunization: Secondary | ICD-10-CM

## 2010-08-21 NOTE — Assessment & Plan Note (Signed)
Summary: COLD OR FLU / FLU /NWS   Vital Signs:  Patient profile:   35 year old female Height:      59 inches Weight:      230.50 pounds BMI:     46.72 O2 Sat:      97 % on Room air Temp:     98.4 degrees F oral Pulse rate:   95 / minute BP sitting:   110 / 70  (left arm) Cuff size:   large  Vitals Entered By: Zella Ball Ewing CMA Duncan Dull) (August 15, 2010 3:41 PM)  O2 Flow:  Room air CC: Head and chest congestion, fever, throat and ear pain/RE   Primary Care Provider:  Newt Lukes MD  CC:  Head and chest congestion, fever, and throat and ear pain/RE.  History of Present Illness: here with acute onset 3 days mild to mod facial pain, pressure, fever and greenish d/c.  adn mild ST, but Pt denies CP, worsening sob, doe, wheezing, orthopnea, pnd, worsening LE edema, palps, dizziness or syncope  Pt denies new neuro symptoms such as headache, facial or extremity weakness  Pt denies polydipsia, polyuria.  No  wt loss, night sweats, loss of appetite or other constitutional symptoms recently.  Overall good compliance with meds, and good tolerability.  No recent overt bruising or bleeding.  Preventive Screening-Counseling & Management      Drug Use:  no.    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 Take 1 Two Times A Day 2)  Coumadin 5 Mg Tabs (Warfarin Sodium) .... Take As Directed 3)  Hydrocodone-Acetaminophen 5-500 Mg Tabs  (Hydrocodone-Acetaminophen) .... Take 1-2 By Mouth Q 6 Hours Prn 4)  [employer] Geophysicist/field seismologist (Respiratory Therapy Supplies) .... Only During Flights 5)  Promethazine Hcl 25 Mg Tabs (Promethazine Hcl) .Marland Kitchen.. 1 By Mouth Every 4 Hours As Needed For Nausea/vomitting Symptoms  Current Medications (verified): 1)  Iron 325 (65 Fe) Mg Tabs (Ferrous Sulfate) .... Take 1 Two Times A Day 2)  Coumadin 5 Mg Tabs (Warfarin Sodium) .... Take As Directed 3)  Hydrocodone-Acetaminophen 5-500 Mg Tabs (Hydrocodone-Acetaminophen) .... Take 1-2 By Mouth Q 6 Hours Prn 4)  [employer] Geophysicist/field seismologist (Respiratory Therapy Supplies) .... Only During Flights 5)  Promethazine Hcl 25 Mg Tabs (Promethazine Hcl) .Marland Kitchen.. 1 By Mouth Every 4 Hours As Needed For Nausea/vomitting Symptoms 6)  Levofloxacin 500 Mg Tabs (Levofloxacin) .Marland Kitchen.. 1po Once Daily 7)  Hydrocodone-Homatropine 5-1.5 Mg/18ml Syrp (Hydrocodone-Homatropine) .Marland Kitchen.. 1 Tsp By Mouth Q 6 Hrs As Needed Cough  Allergies (verified): No Known Drug Allergies  Past History:  Past Medical History: Last updated: 05/30/2010 Anemia-iron deficiency Anticoagulation therapy Pulmonary emboli, bilateral 02/2009 PCOS  obesity OSA         - PSG 10/19/09 RDI 18    MD roster:  gyn -[facility] - [facility] - [facility]   Past Surgical History: Last updated: 05/30/2010 Removal of fibriods- 1997  Social History: Last updated: 08/15/2010 Never Smoked lives with mom and fiance [place] works as Insurance claims handler Drug use-no  Risk Factors: Alcohol Use: 0 (04/07/2009) Caffeine Use: 1 (04/07/2009) Exercise: no (04/07/2009)  Risk Factors: Smoking Status: never (04/07/2009)  Social History: Never Smoked lives with mom and fiance in [place] house works as Insurance claims handler Drug use-no Drug Use:  no  Review of Systems       all otherwise negative per pt -    Physical Exam  General:  alert and overweight-appearing.  ,  mild ill  Head:  normocephalic and atraumatic.   Eyes:  vision grossly intact, pupils equal, and pupils round.   Ears:  bilat tm's midl erythema, sinus marked tedner bilat maxillary areas Nose:  nasal dischargemucosal pallor and mucosal edema.   Mouth:  pharyngeal erythema and fair dentition.   Neck:  supple and cervical lymphadenopathy.   Lungs:  normal respiratory effort and normal breath sounds.   Heart:  normal rate and regular rhythm.   Extremities:  no edema, no erythema    Impression & Recommendations:  Problem # 1:  SINUSITIS- ACUTE-NOS (ICD-461.9)  Her updated medication list for this problem includes:    Levofloxacin 500 Mg Tabs (Levofloxacin) .Marland Kitchen... 1po once daily    Hydrocodone-homatropine 5-1.5 Mg/25ml Syrp (Hydrocodone-homatropine) .Marland Kitchen... 1 tsp by mouth q 6 hrs as needed cough treat as above, f/u any worsening signs or symptoms   Instructed on treatment. Call if symptoms persist or worsen.   Complete Medication List: 1)  Iron 325 (65 Fe) Mg Tabs (Ferrous sulfate) .... Take 1 two times a day 2)  Coumadin 5 Mg Tabs (Warfarin sodium) .... Take as directed 3)  Hydrocodone-acetaminophen 5-500 Mg Tabs (Hydrocodone-acetaminophen) .... Take 1-2 by mouth q 6 hours prn 4)  [employer] (Respiratory therapy supplies) .... Only during flights 5)  Promethazine Hcl 25 Mg Tabs (Promethazine hcl) .Marland Kitchen.. 1 by mouth every 4 hours as needed for nausea/vomitting symptoms 6)  Levofloxacin 500 Mg Tabs (Levofloxacin) .Marland Kitchen.. 1po once daily 7)  Hydrocodone-homatropine 5-1.5 Mg/46ml Syrp (Hydrocodone-homatropine) .Marland Kitchen.. 1 tsp by mouth q 6 hrs as needed cough  Other Orders: Admin 1st Vaccine (91478) DT Vaccine (29562)  Patient Instructions: 1)  Please take all new medications as prescribed 2)  Continue all previous medications as before this visit  3)  You can also use Mucinex OTC or it's generic for congestion , and Allegra for allergies, or even Delsym for milder cough if  needed 4)  Please schedule an appointment with your primary doctor as needed Prescriptions: HYDROCODONE-HOMATROPINE 5-1.5 MG/5ML SYRP (HYDROCODONE-HOMATROPINE) 1 tsp by mouth q 6 hrs as needed cough  #6oz x 1   Entered and Authorized by:   Corwin Levins MD   Signed by:   Corwin Levins MD on 08/15/2010   Method used:   Print then Give to Patient   RxID:   [id number] LEVOFLOXACIN 500 MG TABS (LEVOFLOXACIN) 1po once daily  #10 x 0   Entered and Authorized by:   Corwin Levins MD   Signed by:   Corwin Levins MD on 08/15/2010   Method used:   Print then Give to Patient   RxID:   [id number]    Orders Added: 1)  Admin 1st Vaccine [90471] 2)  DT Vaccine [64403] 3)  Est. Patient Level III [47425]

## 2010-08-29 ENCOUNTER — Ambulatory Visit (INDEPENDENT_AMBULATORY_CARE_PROVIDER_SITE_OTHER): Payer: PRIVATE HEALTH INSURANCE | Admitting: *Deleted

## 2010-08-29 DIAGNOSIS — I2699 Other pulmonary embolism without acute cor pulmonale: Secondary | ICD-10-CM

## 2010-08-29 DIAGNOSIS — Z7901 Long term (current) use of anticoagulants: Secondary | ICD-10-CM

## 2010-08-29 LAB — POCT INR: INR: 1.8

## 2010-08-29 NOTE — Patient Instructions (Signed)
INR 1.8  Coumadin 5mg  tabs  Take 2 tabs today and 2 tabs tomorrow Wed and Thur 3/28 & 29 Then 2 tabs WED and SUN 1.5 tabs all other days

## 2010-09-07 LAB — PROTIME-INR
INR: 1 (ref 0.00–1.49)
INR: 1.2 (ref 0.00–1.49)
INR: 1.7 — ABNORMAL HIGH (ref 0.00–1.49)
INR: 1.8 — ABNORMAL HIGH (ref 0.00–1.49)
Prothrombin Time: 12.9 seconds (ref 11.6–15.2)
Prothrombin Time: 15.4 seconds — ABNORMAL HIGH (ref 11.6–15.2)

## 2010-09-07 LAB — CARDIOLIPIN ANTIBODIES, IGG, IGM, IGA
Anticardiolipin IgA: <10 APL U/mL — ABNORMAL LOW (ref <10)
Anticardiolipin IgG: <10 GPL U/mL — ABNORMAL LOW (ref <10)
Anticardiolipin IgG: <10 GPL U/mL — ABNORMAL LOW (ref <10)
Anticardiolipin IgM: <10 MPL U/mL — ABNORMAL LOW (ref <10)

## 2010-09-07 LAB — CBC
HCT: 33.2 % — ABNORMAL LOW (ref 36.0–46.0)
Hemoglobin: 11.2 g/dL — ABNORMAL LOW (ref 12.0–15.0)
Hemoglobin: 12 g/dL (ref 12.0–15.0)
Hemoglobin: 12 g/dL (ref 12.0–15.0)
MCHC: 33.5 g/dL (ref 30.0–36.0)
MCHC: 33.6 g/dL (ref 30.0–36.0)
MCV: 80.5 fL (ref 78.0–100.0)
MCV: 81 fL (ref 78.0–100.0)
Platelets: 256 10*3/uL (ref 150–400)
Platelets: UNDETERMINED 10*3/uL (ref 150–400)
RBC: 4.14 MIL/uL (ref 3.87–5.11)
RBC: 4.17 MIL/uL (ref 3.87–5.11)
RBC: 4.4 MIL/uL (ref 3.87–5.11)
RBC: 4.51 MIL/uL (ref 3.87–5.11)
RBC: 4.65 MIL/uL (ref 3.87–5.11)
RDW: 13.8 % (ref 11.5–15.5)
RDW: 14.1 % (ref 11.5–15.5)
WBC: 6.6 10*3/uL (ref 4.0–10.5)
WBC: 6.6 10*3/uL (ref 4.0–10.5)
WBC: 7.8 10*3/uL (ref 4.0–10.5)
WBC: 7.9 10*3/uL (ref 4.0–10.5)
WBC: 9.3 10*3/uL (ref 4.0–10.5)

## 2010-09-07 LAB — VON WILLEBRAND FACTOR MULTIMER
Factor-VIII Activity: 60 % (ref 50–180)
Von Willebrand Factor Ag: 217 % (ref 50–217)

## 2010-09-07 LAB — PROTEIN S ACTIVITY: Protein S Activity: 103 % (ref 69–129)

## 2010-09-07 LAB — POCT CARDIAC MARKERS
CKMB, poc: <1 ng/mL — ABNORMAL LOW (ref 1.0–8.0)
Myoglobin, poc: 66.4 ng/mL (ref 12–200)
Troponin i, poc: <0.05 ng/mL (ref 0.00–0.09)

## 2010-09-07 LAB — COMPREHENSIVE METABOLIC PANEL
AST: 20 U/L (ref 0–37)
Albumin: 2.9 g/dL — ABNORMAL LOW (ref 3.5–5.2)
Alkaline Phosphatase: 88 U/L (ref 39–117)
Alkaline Phosphatase: 96 U/L (ref 39–117)
BUN: 4 mg/dL — ABNORMAL LOW (ref 6–23)
BUN: 9 mg/dL (ref 6–23)
CO2: 21 mEq/L (ref 19–32)
CO2: 21 mEq/L (ref 19–32)
Calcium: 8.3 mg/dL — ABNORMAL LOW (ref 8.4–10.5)
Chloride: 104 mEq/L (ref 96–112)
Chloride: 106 mEq/L (ref 96–112)
Creatinine, Ser: 0.67 mg/dL (ref 0.4–1.2)
Creatinine, Ser: 0.84 mg/dL (ref 0.4–1.2)
GFR calc Af Amer: >60 mL/min (ref >60)
GFR calc non Af Amer: >60 mL/min (ref >60)
Glucose, Bld: 140 mg/dL — ABNORMAL HIGH (ref 70–99)
Potassium: 4.4 mEq/L (ref 3.5–5.1)
Sodium: 135 mEq/L (ref 135–145)
Total Bilirubin: 0.2 mg/dL — ABNORMAL LOW (ref 0.3–1.2)
Total Bilirubin: 0.4 mg/dL (ref 0.3–1.2)
Total Protein: 6.3 g/dL (ref 6.0–8.3)

## 2010-09-07 LAB — LIPID PANEL
Cholesterol: 179 mg/dL (ref 0–200)
Total CHOL/HDL Ratio: 2.4 RATIO

## 2010-09-07 LAB — BETA-2-GLYCOPROTEIN I ABS, IGG/M/A: Beta-2-Glycoprotein I IgA: <9 SAU (ref <=20)

## 2010-09-07 LAB — LUPUS ANTICOAGULANT PANEL
Lupus Anticoagulant: NOT DETECTED
PTTLA 4:1 Mix: 98.3 secs — ABNORMAL HIGH (ref 36.3–48.8)
dRVVT Incubated 1:1 Mix: 40.1 secs (ref 36.1–47.0)

## 2010-09-07 LAB — IRON AND TIBC
Saturation Ratios: 4 % — ABNORMAL LOW (ref 20–55)
TIBC: 454 ug/dL (ref 250–470)
UIBC: 436 ug/dL

## 2010-09-07 LAB — FACTOR 5 LEIDEN

## 2010-09-07 LAB — DIFFERENTIAL
Lymphocytes Relative: 31 % (ref 12–46)
Lymphs Abs: 2.8 10*3/uL (ref 0.7–4.0)
Neutrophils Relative %: 65 % (ref 43–77)

## 2010-09-07 LAB — PROTEIN C, TOTAL: Protein C, Total: 89 % (ref 70–140)

## 2010-09-07 LAB — HOMOCYSTEINE: Homocysteine: 5.9 umol/L (ref 4.0–15.4)

## 2010-09-07 LAB — HEMOGLOBIN A1C: Hgb A1c MFr Bld: 5.3 % (ref 4.6–6.1)

## 2010-09-07 LAB — POCT PREGNANCY, URINE: Preg Test, Ur: NEGATIVE

## 2010-09-08 LAB — CBC
MCHC: 33.7 g/dL (ref 30.0–36.0)
MCV: 79.7 fL (ref 78.0–100.0)
Platelets: 308 10*3/uL (ref 150–400)
RDW: 14 % (ref 11.5–15.5)

## 2010-09-08 LAB — BASIC METABOLIC PANEL
BUN: 10 mg/dL (ref 6–23)
CO2: 22 mEq/L (ref 19–32)
Chloride: 105 mEq/L (ref 96–112)
Creatinine, Ser: 0.8 mg/dL (ref 0.4–1.2)

## 2010-09-08 LAB — PROTIME-INR: Prothrombin Time: 12.2 seconds (ref 11.6–15.2)

## 2010-09-08 LAB — DIFFERENTIAL
Basophils Relative: 0 % (ref 0–1)
Eosinophils Absolute: 0.1 10*3/uL (ref 0.0–0.7)
Neutrophils Relative %: 75 % (ref 43–77)

## 2010-09-08 LAB — POCT CARDIAC MARKERS: Myoglobin, poc: 51.2 ng/mL (ref 12–200)

## 2010-09-08 LAB — D-DIMER, QUANTITATIVE: D-Dimer, Quant: 2.08 ug/mL-FEU — ABNORMAL HIGH (ref 0.00–0.48)

## 2010-09-10 ENCOUNTER — Other Ambulatory Visit: Payer: Self-pay | Admitting: Internal Medicine

## 2010-09-19 ENCOUNTER — Ambulatory Visit (INDEPENDENT_AMBULATORY_CARE_PROVIDER_SITE_OTHER): Payer: PRIVATE HEALTH INSURANCE | Admitting: *Deleted

## 2010-09-19 DIAGNOSIS — Z7901 Long term (current) use of anticoagulants: Secondary | ICD-10-CM

## 2010-09-19 DIAGNOSIS — I2699 Other pulmonary embolism without acute cor pulmonale: Secondary | ICD-10-CM

## 2010-09-19 LAB — POCT INR: INR: 2.4

## 2010-10-16 NOTE — Consult Note (Signed)
[name], [name]               ACCOUNT NO.:  [id number]   MEDICAL RECORD NO.:  [id number]          PATIENT TYPE:  INP   LOCATION:  4737                         FACILITY:  [facility]   PHYSICIAN:  Samul Dada, M.D.DATE OF BIRTH:  January 02, 1976   DATE OF CONSULTATION:  02/02/2009  DATE OF DISCHARGE:                                 CONSULTATION   REASON FOR CONSULTATION:  1. Rule out hypercoagulable state.  2. Bilateral pulmonary emboli.   REQUESTING PHYSICIAN:  Dr. Ninfa Linden.   HISTORY OF PRESENT ILLNESS:  [name] is a delightful 35 year old  woman, found to have bilateral pulmonary embolism, as she presented to  the emergency department on August 31, with a 24-hour history of  pleuritic chest pain, shortness of breath, which required a CT angio.  This CT angio of the chest, performed on February 01, 2009, showed large  bilateral PE at the lower lobes, greater on the left, plus/minus mild  atelectasis.  There was no adenopathy seen.  There were no acute  findings in the upper abdomen.  Then, the patient was placed on heparin,  and eventually on Lovenox, which current dose is 100 mg q.12 h, as per  Dr. Delford Field.  She also began on Coumadin on September 1, at 7.5 mg  yesterday and today, equals 2 doses.  Hypercoagulation panel was drawn  about 5:45 in the morning on September 1, a few hours after heparin was  started, but before coumadinization.  These results are currently  pending.  There is no family history of coagulation problems.  However,  the patient had been on NuvaRing, as part of birth control, as well as  to control heavy bleeding during her periods.  No recent travel.  She  does have sedentary lifestyle.  Denies any tobacco or any other  substance abuse. The Dopplers in both lower extremities were negative  for DVT or Baker's cyst.  We were asked to see her with recommendations  regarding her care.   PAST MEDICAL HISTORY:  1. History of asthmatic bronchitis - asthma per  chart.  2. Obesity.  3. History of uterine fibroids - possible polycystic ovary disease,      per patient report.  4. Borderline hypothyroidism, per patient report.   SURGERIES/PROCEDURES:  Status post fibroid removal in the year 2004, at  [facility].   ALLERGIES:  NKDA.   MEDICATIONS:  1. Lovenox 100 mg q.12 h.  2. Morphine sulfate p.r.n.  3. Protonix 30 mg q.12 h.  4. Coumadin 7.5 mg p.o. or as directed.  5. Tylenol p.r.n.  6. Roxicodone 5 mg q.4 h p.r.n.   REVIEW OF SYSTEMS:  See HPI for significant positives.  Currently, the  patient is having less pleuritic left chest pain than on admission.  She  is fatigued.  Her dyspnea on exertion is still present, but her  shortness of breath at rest has resolved.  The rest of the review of  systems is negative.   FAMILY HISTORY:  Noncontributory.  Mother and father alive and well, she  has no siblings.  She does have one aunt that  has a history of what she  describes as leg clots.   SOCIAL HISTORY:  The patient is single, engaged, her fiance is today  with her.  No children.  No tobacco or alcohol history.  No drug use.  She is a Environmental manager.  She lives in [place].   PHYSICAL EXAMINATION:  GENERAL:  This is an obese 35 year old white  female in no acute distress, alert and oriented x3.  VITALS:  Blood pressure 108/78, pulse 69, respirations 19, temperature  98, pulse oximetry 96% in room air.  Weight 105 kg, height 410.  HEENT:  Normocephalic, atraumatic.  PERRL.  Oral cavity without lesions  or thrush.  NECK:  Supple.  No cervical or supraclavicular masses.  LUNGS:  Clear to auscultation bilaterally, with the exception of mild  pleuritic chest pain upon deep inspiration.  CARDIOVASCULAR:  Regular rate and rhythm without murmurs, rubs or  gallops.  ABDOMEN:  Soft, nontender.  Bowel sounds x4.  No hepatosplenomegaly.  Obese.  EXTREMITIES:  No clubbing or cyanosis.  No edema.  No inguinal masses.  SKIN:  Without bruising or  petechial rash.  BREASTS:  Not examined.  GU and RECTAL:  Deferred.  MUSCULOSKELETAL:  No spinal tenderness.  NEURO:  Nonfocal.   LABORATORY DATA:  Last hemoglobin 12, hematocrit 35.4, white count 9.3,  platelets 254, MCV 80.5.  D-dimer 2.08 on admission on January 31, 2009.  PTT 25, PT 12.9, INR 1.0.  Sodium 135, potassium 3.9, BUN 4, creatinine  0.67, glucose 89, total bilirubin 0.2, alkaline phosphatase 96, AST 15,  ALT 17, total protein 6.3, albumin 2.7, calcium 8.4.  Homocysteine is  5.9.  Negative pregnancy test.  Troponin negative.  The hypercoagulable  panel and TSH are currently pending.   ASSESSMENT:  Dr. Arline Asp has seen and evaluated the patient.  This is a  35 year old woman admitted with bilateral large pulmonary emboli,  negative lower extremity Dopplers, who has a history of heavy periods  secondary to uterine fibroids, and has been on NuvaRing for 2 years,  which was removed after admission.  The patient is now on Lovenox  b.i.d., as per Dr. Delford Field, and after IV heparin for the first 12 hours.  The patient was started on Coumadin on February 01, 2009, at 7.5 mg  yesterday and today, equals 2 doses.  Hypercoagulable panel was drawn  about 5:45 a.m. on September 1, a few hours after heparin was started,  but before Coumadin.  There is no family history of clotting disorders.   PLAN:  1. Life-threatening bilateral pulmonary emboli, negative family      history, negative Dopplers, may be secondary to NuvaRing, which      contains progesterone and estrogen combo, rule out hypercoagulable      state, the labs are currently pending.  Would recommend continuing      Coumadin for 1 year, then to consider stopping if the workup is      negative.  2. No hormonal agents.  3. The patient needs at least 5 days of Coumadin to achieve decreased      levels of factor IX and X, which had longer half life, with      subsequent therapeutic anticoagulation with Coumadin.  Lovenox  must      be continued for at least 5 days.  4. Agree with GYN consult to manage her GYN bleeding.  May increase      above the patient's usual baseline.  5. To rule out von Willebrand  disease and assess iron stores.  The      patient may need p.o. iron.   Dr. Arline Asp will need to see the patient after discharge.  Call if you  have any questions or concerns, pager number [phone], Dr. Arline Asp.   Thank you very much for allowing [place] the opportunity to participate in  the care of this nice patient.Marlowe Kays, P.A.      Samul Dada, M.D.  Electronically Signed    SW/MEDQ  D:  02/03/2009  T:  02/03/2009  Job:  [id number]   cc:   Marquis Lunch. Powell, P.A.

## 2010-10-16 NOTE — H&P (Signed)
[name], [name]               ACCOUNT NO.:  [id number]   MEDICAL RECORD NO.:  [id number]          PATIENT TYPE:  INP   LOCATION:  4737                         FACILITY:  [facility]   PHYSICIAN:  Oswald Hillock, MD        DATE OF BIRTH:  1976-01-05   DATE OF ADMISSION:  01/31/2009  DATE OF DISCHARGE:                              HISTORY & PHYSICAL   CHIEF COMPLAINT:  Chest pain.   HISTORY OF PRESENT ILLNESS:  The patient is a 35 year old Caucasian  female who presents to the emergency room with 24-hour history of chest  pain, started last night at around 3 in the morning associated with some  shortness of breath.  Pain is worsened by exertion and deep breathing.  The patient has been having some dyspnea on exertion for the last month  or so.  She denies any recent history of long distance travel.  No  recent history of cough, fever, rigors, chills, abdominal pain, nausea,  vomiting, diarrhea, dizziness, loss of consciousness, or any focal  weakness of any part of the body.  No urinary symptoms reported by the  patient either.   PAST MEDICAL HISTORY:  History of uterine fibroids.   PAST SURGICAL HISTORY:  History of fibroid removal.   CURRENT MEDICATIONS:  The patient is on NuvaRing.   SOCIAL HISTORY:  The patient denies any alcohol, tobacco, or drug use.  She has a sedentary lifestyle.   FAMILY HISTORY:  No history of DVT/PE in the family.  No history of  lupus in the family either.   ALLERGIES:  No known drug allergies.   REVIEW OF SYSTEMS:  An extensive review of systems done and all systems  are negative except for the positives as mentioned in the history of  present illness.   PHYSICAL EXAMINATION:  VITAL SIGNS:  On admission, pulse 112, blood  pressure 128/98, respiratory rate of 18, temperature 98.4, and O2 sats  of 99% on 2 L nasal cannula.  GENERAL:  An obese Caucasian female in no acute distress at the time of  this interview.  HEENT:  No scleral icterus.  No  pallor.  Ears negative.  No significant  oropharyngeal lesions.  NECK:  Supple.  No lymphadenopathy.  No JVD.  No carotid bruits.  CHEST:  Breath sounds heard bilaterally with good air entry.  No added  sounds.  CARDIOVASCULAR:  S1 and S2 plus regular.  No gallop or rub.  Tachycardia  plus.  ABDOMEN:  Obese, nontender.  Bowel sounds present.  EXTREMITIES:  No cyanosis, clubbing, or edema.  No calf tenderness.  Hoffmann sign is negative.  NEUROLOGIC:  Cranial nerves II-XII are grossly intact.  No focal motor  or sensory deficits noted on gross examination.   LABORATORY DATA:  1. Her WBC count is 11.4, hemoglobin 13.1, hematocrit 39, and platelet      count of 308.  D-dimer was 2.08.  Myoglobin was 51.2.  CK-MB less      than 1 and troponin less than 0.05.  Sodium 138, potassium 4.0,      chloride 105, CO2  of 22, glucose 112, BUN 10, and creatinine 0.8.  2. Chest x-ray showed low lung volumes, left basilar opacity, possible      infiltrate.  3. CT of the chest revealed bilateral large pulmonary emboli, left      greater than right.  Left base atelectasis, early infarct, right      base atelectasis.  4. Urine pregnancy was negative.  PT was 12.2 and INR 0.9.  5. EKG pending.   IMPRESSION AND PLAN:  This is a case of 35 year old Caucasian female  with no significant past medical history who presents with a 24-hour  history of chest pain and was noted to have a large bilateral pulmonary  embolisms.  1. Bilateral pulmonary embolism:  The patient is already started on a      heparin drip.  Hypercoagulable panel has been drawn and the patient      will be admitted to telemetry.  We will monitor her closely.  She      will have to be started on Coumadin.  We will get bilateral lower      extremity Dopplers to rule out possible DVTs and look for potential      causes of her getting bilateral Pes.  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  We will consult Pulmonary Medicine  and      follow up with their recommendations.  As mentioned above, the      patient will be monitored closely given the large bilateral nature      of her pulmonary emboli.  2. Deep vein thrombosis/gastrointestinal prophylaxis:  The patient is      already on heparin drip and will put her on Protonix for GI      prophylaxis.  3. Health maintenance:  We will check baseline TSH, hemoglobin A1c,      and fasting lipid panel and follow up with the results.      Oswald Hillock, MD  Electronically Signed     BA/MEDQ  D:  02/01/2009  T:  02/01/2009  Job:  [id number]

## 2010-10-17 ENCOUNTER — Ambulatory Visit (INDEPENDENT_AMBULATORY_CARE_PROVIDER_SITE_OTHER): Payer: PRIVATE HEALTH INSURANCE | Admitting: *Deleted

## 2010-10-17 DIAGNOSIS — Z7901 Long term (current) use of anticoagulants: Secondary | ICD-10-CM

## 2010-10-17 DIAGNOSIS — I2699 Other pulmonary embolism without acute cor pulmonale: Secondary | ICD-10-CM

## 2010-10-17 LAB — POCT INR: INR: 3.3

## 2010-10-22 ENCOUNTER — Other Ambulatory Visit (HOSPITAL_COMMUNITY): Payer: Self-pay | Admitting: Oncology

## 2010-10-22 ENCOUNTER — Encounter (HOSPITAL_BASED_OUTPATIENT_CLINIC_OR_DEPARTMENT_OTHER): Payer: PRIVATE HEALTH INSURANCE | Admitting: Oncology

## 2010-10-22 DIAGNOSIS — I2699 Other pulmonary embolism without acute cor pulmonale: Secondary | ICD-10-CM

## 2010-10-22 DIAGNOSIS — Z7901 Long term (current) use of anticoagulants: Secondary | ICD-10-CM

## 2010-10-22 DIAGNOSIS — I82890 Acute embolism and thrombosis of other specified veins: Secondary | ICD-10-CM

## 2010-10-22 LAB — CBC WITH DIFFERENTIAL/PLATELET
Basophils Absolute: 0 10*3/uL (ref 0.0–0.1)
EOS%: 2.2 % (ref 0.0–7.0)
HGB: 12.9 g/dL (ref 11.6–15.9)
MCH: 28.3 pg (ref 25.1–34.0)
NEUT#: 3.4 10*3/uL (ref 1.5–6.5)
RBC: 4.58 10*6/uL (ref 3.70–5.45)
RDW: 14.4 % (ref 11.2–14.5)
lymph#: 1.9 10*3/uL (ref 0.9–3.3)

## 2010-10-25 LAB — COMPREHENSIVE METABOLIC PANEL
ALT: 21 U/L (ref 0–35)
AST: 20 U/L (ref 0–37)
CO2: 22 mEq/L (ref 19–32)
Calcium: 8.8 mg/dL (ref 8.4–10.5)
Chloride: 103 mEq/L (ref 96–112)
Sodium: 136 mEq/L (ref 135–145)
Total Bilirubin: 0.3 mg/dL (ref 0.3–1.2)
Total Protein: 6.3 g/dL (ref 6.0–8.3)

## 2010-10-25 LAB — IRON AND TIBC: Iron: 45 ug/dL (ref 42–145)

## 2010-10-25 LAB — LACTATE DEHYDROGENASE: LDH: 170 U/L (ref 94–250)

## 2010-10-25 LAB — D-DIMER, QUANTITATIVE: D-Dimer, Quant: 0.26 ug/mL-FEU (ref 0.00–0.48)

## 2010-11-07 ENCOUNTER — Ambulatory Visit (INDEPENDENT_AMBULATORY_CARE_PROVIDER_SITE_OTHER): Payer: PRIVATE HEALTH INSURANCE | Admitting: *Deleted

## 2010-11-07 DIAGNOSIS — I2699 Other pulmonary embolism without acute cor pulmonale: Secondary | ICD-10-CM

## 2010-11-07 DIAGNOSIS — Z7901 Long term (current) use of anticoagulants: Secondary | ICD-10-CM

## 2010-11-07 LAB — POCT INR: INR: 2.6

## 2010-12-12 ENCOUNTER — Ambulatory Visit (INDEPENDENT_AMBULATORY_CARE_PROVIDER_SITE_OTHER): Payer: PRIVATE HEALTH INSURANCE | Admitting: *Deleted

## 2010-12-12 DIAGNOSIS — I2699 Other pulmonary embolism without acute cor pulmonale: Secondary | ICD-10-CM

## 2010-12-12 DIAGNOSIS — Z7901 Long term (current) use of anticoagulants: Secondary | ICD-10-CM

## 2010-12-12 LAB — POCT INR: INR: 3.2

## 2011-01-04 ENCOUNTER — Encounter (HOSPITAL_COMMUNITY): Payer: Self-pay

## 2011-01-04 ENCOUNTER — Ambulatory Visit (HOSPITAL_COMMUNITY)
Admission: RE | Admit: 2011-01-04 | Discharge: 2011-01-04 | Disposition: A | Discharge status: Home / Self Care | Payer: PRIVATE HEALTH INSURANCE | Source: Ambulatory Visit | Attending: Obstetrics and Gynecology | Admitting: Obstetrics and Gynecology

## 2011-01-04 DIAGNOSIS — D259 Leiomyoma of uterus, unspecified: Secondary | ICD-10-CM

## 2011-01-04 NOTE — Progress Notes (Signed)
[name] is a 35 y.o. female patient, never pregnant, but wishes to be.  Her problems include:  1. She has a large posterior uterine myoma. 2. 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 Chronic iron deficient anemia related to the same.   4. She is presently using barrier contraception due to concern about use of various methods of hormonal and intrauterine contraception methods.   5. She has a BMI of 47 6.  Her age is 35 years.  Past Medical History  Diagnosis Date  . Sleep apnea   . Blood dyscrasia   . Anemia   . Fibroid     Current Outpatient Prescriptions  Medication Sig Dispense Refill  . ferrous sulfate 325 (65 FE) MG tablet TAKE ONE TABLET BY MOUTH TWICE DAILY  60 tablet  5  . warfarin (COUMADIN) 5 MG tablet Take by mouth as directed.         Allergies no known allergies Active Problems:  * No active hospital problems. *   There were no vitals taken for this visit.  Review of Systems  Constitutional: Negative.   HENT: Negative.   Eyes: Negative.   Respiratory: Negative.   Cardiovascular: Chest pain: Historical, unrlated to PE.  Gastrointestinal: Positive for abdominal pain (Related to myomas).  Genitourinary: Negative.   Musculoskeletal: Negative.   Skin: Negative.   Neurological: Negative.   Endo/Heme/Allergies: Negative.   Psychiatric/Behavioral: Negative.     Physical Exam  Nursing note and vitals reviewed. Constitutional: She appears well-developed and well-nourished.   Mrs Speece is not iin good position to become pregnant based on the above features, which increase risk for prematurity, preeclampsia, congenital anomalies, gestational diabetes, and impared fetal growth. She would benefit form decreasing her weight by 60-100 lb, which would realistically take 12-18 months if she is faithful to a program of weight loss, or alternatively to bariatric surgery.  The latter would need to take into consideration her chronic  PE status. Her myoma does constitute a risk for further extensive blood loss and eventual surgery may be necessary for resolution, and possibly to reduce pregnancy risks. She may benefit from a depot GNRH agonist for 3 months before such surgery.  Her warfarin management will need to be modified for surgery.  She is a candidate for several forms of non-estrogenic hormone systems (see below). [place] Medical Eligibility Criteria for Contraceptive Use: Summary of classifications for hormonal contraceptive methods and intrauterine devices  Health-care providers can use the summary table as a quick reference guide to the classifications for hormonal contraceptive methods and intrauterine contraception and to compare classifications across these methods. See the full appendix for each method for clarifications to the numeric categories, as well as for summaries of the evidence and additional comments.  BOX. Categories for classifying hormonal contraceptives and IUDs  1 = A condition for which there is no restriction for the use of the contraceptive method. 2 = A condition for which the advantages of using the method generally outweigh the theoretical or proven risks. 3 = A condition for which the theoretical or proven risks usually outweigh the advantages of using the method. 4 = A condition that represents an unacceptable health risk if the contraceptive method is used.  Personal characteristics and reproductive history  DVT/PE and established on anticoagulant therapy for at least 3 mos  i. Higher risk for recurrent DVT/PE (?1 risk factors) Known thrombophilia, including antiphospholipid syndrome  Active cancer (metastatic, on therapy, or within 6 mos  after clinical remission), excluding non-melanoma skin cancer  History of recurrent DVT/PE   Condition COC/P/R     POP   DMPA Implants LNG-IUD Cu-IUD              4*       2   2   2   2   2  COC: combined oral contraceptive; P: combined hormonal contraceptive  patch; R: combined hormonal vaginal ring; POP: progestin-only pill; DMPA: depot medroxyprogesterone acetate; IUD: intrauterine device; LNG-IUD: levonorgestrel-releasing IUD; Cu-IUD: copper IUD; BMI: body mass index (weight [kg]/height [m2]); DVT: deep venous thrombosis; VTE: venous thromboembolism; CHC: combined hormonal contraceptive; PE: pulmonary embolism; hCG: human chorionic gonadotropin; PID: pelvic inflammatory disease; STI: sexually transmitted infection; HIV: human immunodeficiency virus; AIDS: acquired immunodeficiency syndrome; NRTI: nucleoside reverse transcriptase inhibitor; NNRTI: non-nucleoside reverse transcriptase. * Consult the appendix for this contraceptive method for a clarification to this classification. ** Some studies suggest that women using progestin-only injectable contraception might be at increased risk for HIV acquisition; other studies do not show this association. CDC reviewed all available evidence and agreed that the data were not sufficiently conclusive to change current guidance. However, because of the inconclusive nature of the body of evidence on possible increased risk for HIV acquisition, women using progestin-only injectable contraception should be strongly advised to also always use condoms (female or female) and take other HIV preventive measures.  ? Clarification: For women with other risk factors for VTE, these risk factors might increase the classification to a "4"; for example, smoking, deep venous thrombosis/pulmonary embolism, known thrombogenic mutations, and peripartum cardiomyopathy. ? The breastfeeding recommendations are divided by month in [place] Medical Eligibility Criteria for Contraceptive Use, 2010. They have been divided by days for purposes of integration with the postpartum recommendations.  Condition that exposes a woman to increased risk as a result of unintended pregnancy.  Updated information reproduced from: Update to CDC's [place] Medical  Eligibility Criteria for Contraceptive Use, 2010: Revised Recommendations for the Use of Contraceptive Methods During the Postpartum Period. MMWR Morb Mortal Wkly Rep 2011; 7323308726. Original table published in: [place] Medical Eligibility Criteria for Contraceptive Use 2010, with data adapted from the [facility] Medical Eligibility Criteria for Contraceptive Use, 4th edition. Patient continues to have evidence of only partially resolved major DVT/PE.    Patient counseled face to face for 30 minutes with recommendation for safe and adequate contraceptive method, consideration for myoma surgery, attempt weight loss of at least 30-60 pounds over next 6-12 months, conversion to LMWH from warfarin when she clears her chronic PE status, re-establish normal hemogram,  re-counsel then for becoming pregnant. Thanks you for the opportunity to consult with Mrs Embry.  Jontez Redfield,JOE 01/04/2011

## 2011-01-09 ENCOUNTER — Ambulatory Visit (INDEPENDENT_AMBULATORY_CARE_PROVIDER_SITE_OTHER): Payer: PRIVATE HEALTH INSURANCE | Admitting: *Deleted

## 2011-01-09 DIAGNOSIS — Z7901 Long term (current) use of anticoagulants: Secondary | ICD-10-CM

## 2011-01-09 DIAGNOSIS — I2699 Other pulmonary embolism without acute cor pulmonale: Secondary | ICD-10-CM

## 2011-01-09 LAB — POCT INR: INR: 2.7

## 2011-01-11 ENCOUNTER — Encounter: Payer: Self-pay | Admitting: Internal Medicine

## 2011-01-14 ENCOUNTER — Ambulatory Visit (INDEPENDENT_AMBULATORY_CARE_PROVIDER_SITE_OTHER): Payer: PRIVATE HEALTH INSURANCE | Admitting: Internal Medicine

## 2011-01-14 ENCOUNTER — Encounter: Payer: Self-pay | Admitting: Internal Medicine

## 2011-01-14 VITALS — BP 110/78 | HR 79 | Temp 98.8°F | Ht 59.0 in | Wt 227.0 lb

## 2011-01-14 DIAGNOSIS — I2699 Other pulmonary embolism without acute cor pulmonale: Secondary | ICD-10-CM

## 2011-01-14 DIAGNOSIS — Z01818 Encounter for other preprocedural examination: Secondary | ICD-10-CM

## 2011-01-14 DIAGNOSIS — Z136 Encounter for screening for cardiovascular disorders: Secondary | ICD-10-CM

## 2011-01-14 NOTE — Assessment & Plan Note (Addendum)
B PE dx 01/2009 while on hormone tx- on chronic anticoag indefinitely  heterozygous for prothrombin II mutation (labs 03/2010 per heme note) = specific hypercoag dz identified Follows with heme and [facility] for same Overlap with LMWH preop per [facility] planned for 02/12/11 myomectomy - anticipate stopping coumadin 5d preop Postop anticipate IV hep or LMWH overlap with coumadin resumption per surgeon preferences

## 2011-01-14 NOTE — Patient Instructions (Signed)
It was good to see you today. You have been evaluated and it is felt that your surgical risk is low and outweighed by the potential benefit of the surgery. Therefore, you are medically clear to proceed when scheduling allows. Coumadin clinic will help coordinate the Lovenox injections 5days before surgery when you stop coumadin and then resuming the coumadin after surgery follow up with Dr. Arline Asp as planned - copy of this note will be sent to him as well as Dr. Rana Snare as requested Please schedule followup in 6 months, call sooner if problems.

## 2011-01-14 NOTE — Progress Notes (Signed)
Subjective:    Patient ID: [name], female    DOB: Apr 26, 1976, 35 y.o.   MRN: [id number]  HPI  Here for medical clearance - requested by Dr. Rana Snare - myomectomy planned 02/12/11 (gyn=[facility]) Pt feels well, looking forward to eliminating pelvic cramping with gyn intervention - also ?re pregnancy planning  Also reviewed chronic medical issues:  bilateral PE dx- late 01/2009 -hosp for same 02/2009 at [facility]  prior to that dx been on estrogen tx nuvaring for PCOS - hormones felt to be precipitating factor Also heterozygous for prothrombin II gene mutation in 03/2010= evidence for hypercoag state on prior lab panel -  has been following with heme ([facility]) - next follow up 01/2011 "to see if can go off coumadin", "probably never" per pt (i confirmed with 10/2010 review of last heme OV) also [facility] for coumadin mgmt - no bleeding or bruising problems   obesity - reports weight has been stable since PE dx  Follows gluten free diet but no exercise program -  no known complications related to obesity (chol ok, no DM, no arthritis)   OSA - sleep study done 11/2009 - status post pulm eval for same -  Declined CPAP - reports improved on gluten free diet  Past Medical History  Diagnosis Date  . OBESITY   . PULMONARY EMBOLISM 02/2009    B PE - chronic anticoag (heme=[facility], [facility])  . Uterine myoma   . POLYCYSTIC OVARIAN DISEASE   . OBSTRUCTIVE SLEEP APNEA   . ANEMIA-IRON DEFICIENCY    Family History  Problem Relation Age of Onset  . Allergies Mother   . Asthma Paternal Grandmother   . Emphysema Paternal Grandfather    History  Substance Use Topics  . Smoking status: Never Smoker   . Smokeless tobacco: Never Used  . Alcohol Use: No    Review of Systems Constitutional: Negative for fever.  Respiratory: Negative for cough and shortness of breath.   Cardiovascular: Negative for chest pain.  Gastrointestinal: Negative for abdominal pain.  Musculoskeletal: Negative for gait problem.    Skin: Negative for rash.  Neurological: Negative for dizziness.  No other specific complaints in a complete review of systems (except as listed in HPI above).     Objective:   Physical Exam BP 110/78  Pulse 79  Temp(Src) 98.8 F (37.1 C) (Oral)  Ht 4\' 11"  (1.499 m)  Wt 227 lb (102.967 kg)  BMI 45.85 kg/m2  SpO2 98% Constitutional: She is obese, short statured. She appears well-developed and well-nourished. No distress.  HENT: Head: Normocephalic and atraumatic. Ears: B TMs ok, no erythema or effusion; Nose: Nose normal.  Mouth/Throat: Oropharynx is clear and moist. No oropharyngeal exudate.  Eyes: Conjunctivae and EOM are normal. Pupils are equal, round, and reactive to light. No scleral icterus.  Neck: Normal range of motion. Neck supple. No JVD present. No thyromegaly present. non tender and no appreciable nodules Cardiovascular: Normal rate, regular rhythm and normal heart sounds.  No murmur heard. chronic 1+ BLE edema. Pulmonary/Chest: Effort normal and breath sounds normal. No respiratory distress. She has no wheezes.  Musculoskeletal: Normal range of motion, no joint effusions. No gross deformities Neurological: She is alert and oriented to person, place, and time. No cranial nerve deficit. Coordination normal. cognition and speech normal Skin: Skin is warm and dry. No rash noted. No erythema.  Psychiatric: She has a normal mood and affect. Her behavior is normal. Judgment and thought content normal.   Lab Results  Component Value Date  WBC 6.8 05/30/2010   HGB 12.9 10/22/2010   HCT 38.2 10/22/2010   MCV 83.5 10/22/2010   PLT 195 10/22/2010   Lab Results  Component Value Date   INR 2.7 01/09/2011   INR 3.2 12/12/2010   INR 2.6 11/07/2010   EKG: sinus @ 80 bpm, low voltage, L axis - no acute changes, unable to locate prior EKG at this time    Assessment & Plan:  preop eval - planned myomectomy 02/12/11 with dr. Rana Snare - This patient has been evaluated and it is felt that the  surgical risk is low and outweighed by the potential benefit of the surgery. Therefore, medically clear to proceed when scheduling allows. Coordination of periop anticoag will be with [facility] (LMWH when stopping coumadin and postop IV hep while IP and LMWH hen ok with surgeon until INR>2 overlap) - note heme ([facility]) will also weigh in with his opinion 01/17/11 - pet pt, no plans to ever stop anticoag  Time spent with pt today 45 minutes, greater than 50% time spent counseling patient on anticoag issues, planned surgery and medication review. Also review of prior records

## 2011-01-17 ENCOUNTER — Encounter (HOSPITAL_BASED_OUTPATIENT_CLINIC_OR_DEPARTMENT_OTHER): Payer: PRIVATE HEALTH INSURANCE | Admitting: Oncology

## 2011-01-17 ENCOUNTER — Other Ambulatory Visit (HOSPITAL_COMMUNITY): Payer: Self-pay | Admitting: Oncology

## 2011-01-17 DIAGNOSIS — I2699 Other pulmonary embolism without acute cor pulmonale: Secondary | ICD-10-CM

## 2011-01-17 DIAGNOSIS — Z7901 Long term (current) use of anticoagulants: Secondary | ICD-10-CM

## 2011-01-17 LAB — CBC WITH DIFFERENTIAL/PLATELET
Basophils Absolute: 0 10*3/uL (ref 0.0–0.1)
Eosinophils Absolute: 0.1 10*3/uL (ref 0.0–0.5)
HGB: 14.4 g/dL (ref 11.6–15.9)
LYMPH%: 28.4 % (ref 14.0–49.7)
MCV: 82.4 fL (ref 79.5–101.0)
MONO%: 2.8 % (ref 0.0–14.0)
NEUT#: 4.4 10*3/uL (ref 1.5–6.5)
Platelets: 204 10*3/uL (ref 145–400)

## 2011-01-17 LAB — IRON AND TIBC
%SAT: 14 % — ABNORMAL LOW (ref 20–55)
TIBC: 396 ug/dL (ref 250–470)
UIBC: 340 ug/dL

## 2011-01-17 LAB — COMPREHENSIVE METABOLIC PANEL
BUN: 12 mg/dL (ref 6–23)
CO2: 26 mEq/L (ref 19–32)
Creatinine, Ser: 0.56 mg/dL (ref 0.50–1.10)
Glucose, Bld: 96 mg/dL (ref 70–99)
Total Bilirubin: 0.3 mg/dL (ref 0.3–1.2)
Total Protein: 7.3 g/dL (ref 6.0–8.3)

## 2011-01-17 LAB — D-DIMER, QUANTITATIVE

## 2011-01-23 ENCOUNTER — Telehealth: Payer: Self-pay | Admitting: *Deleted

## 2011-01-23 ENCOUNTER — Telehealth: Payer: Self-pay | Admitting: Pharmacist

## 2011-01-23 MED ORDER — ENOXAPARIN SODIUM 150 MG/ML ~~LOC~~ SOLN: 150.0000 mg | Freq: Every day | SUBCUTANEOUS | Status: DC

## 2011-01-23 NOTE — Telephone Encounter (Signed)
Pt having fibroid tumors removed on 9/11.  Per Dr. Felicity Coyer, will need lovenox bridge.   Pt's weight- 102 kg, SCr- 0.56   9/5- Last dose of Coumadin 9/6- No Coumadin or Lovenox 9/7- Lovenox 150mg  in AM 9/8- Lovenox 150mg  in AM 9/9- Lovenox 150mg  in AM 9/10- Lovenox 150mg  in AM 9/11- Day of Procedure.  Pt will be admitted for 2-3 days after surgery.  Will f/u within 1 week of discharge.

## 2011-01-23 NOTE — Telephone Encounter (Signed)
Received fax med was approve notified pharmacy spoke with Okey Regal she states did receive call from insurance company already they notified pt rx will be ready for pick-up on tomorrow...01/23/11@3 :00pm/LMB

## 2011-01-23 NOTE — Telephone Encounter (Signed)
Received fax needing PA on Lovenox 150mg  injection. Notified insurance spoke with Vanessa/rep gave PA info. Faxing over form for md to complete...01/23/11@1 :13pm/LMB

## 2011-01-31 NOTE — H&P (Signed)
[name], [name]               ACCOUNT NO.:  [id number]  MEDICAL RECORD NO.:  [id number]  LOCATION:  [facility]IO                         FACILITY:  [facility]  PHYSICIAN:  Dineen Kid. Rana Snare, M.D.    DATE OF BIRTH:  04-Jul-1975  DATE OF ADMISSION:  02/12/2011 DATE OF DISCHARGE:                             HISTORY & PHYSICAL   HISTORY OF PRESENT ILLNESS:  Ms. Sabatino is a 35 year old nulligravida white female with worsening problems with menorrhagia, dysmenorrhea, and very large pelvic fibroid.  Recent ultrasound shows a posterior wall fibroid measuring 9.7 x 8.7 x 11.2 cm, normal-appearing ovaries, and no other appreciable fibroids were noted.  She has been having problems with menorrhagia, menometrorrhagia, and is not a candidate for hormonal therapy due to medical conditions.  She presents for definitive surgical intervention and planned myomectomy.  PAST MEDICAL HISTORY:  Significant for history of prothrombin II gene mutation.  She is followed by Dr. Arline Asp in the Hematology/Oncology Department.  Because of that, she also has a pulmonary embolism which apparently is still actively and has not resolved.  She is not a candidate for hormonal therapy per her hematologist and she is currently anticoagulated previously on Coumadin and will be switched to Lovenox before the surgery.  She has apparently had a normal thrombophilia workup except for the prothrombin II mutation.  Apparently she was on Nuvaring when she had the pulmonary embolism and apparently this has been more than 2 years now and it is still not resolved but she does have normal pulmonary function otherwise.  PAST SURGICAL HISTORY:  She did have a myomectomy by Dr. Genevive Bi in October 2005 which was uncomplicated.  MEDICATIONS:  Coumadin which has been switched to Lovenox.  She is on iron.  She takes hydrocodone and acetaminophen for pain when needed. She is also on iodine.  ALLERGIES:  She reports allergies to GLUTEN and she  reports that OXYCODONE makes her swimmy headed, so prefers hydrocodone.  PHYSICAL EXAMINATION:  VITAL SIGNS:  Her blood pressure is 120/80. HEART:  Regular rate and rhythm. LUNGS:  Clear to auscultation bilaterally. ABDOMEN:  Obese but nondistended and nontender. PELVIC:  Normal external genitalia, Bartholin's, Skene's and urethra. Vagina is without lesions.  Cervix within normal limits.  Uterus approximately 12-week size, irregular, and consistent with fibroids.  IMPRESSION AND PLAN:  Menorrhagia, dysmenorrhea, and large fibroids.  I had lengthy discussions with [name] with regards to different options. She underwent prenatal counseling with the Maternal Fetal Medicine specialist to see if she would either be a candidate to have children and they feel like she would be able to even though she has chronic pulmonary embolism.  They think that she would need to be anticoagulated throughout pregnancy.  Because of this, [name] does want to preserve her uterus and desires myomectomy rather than hysterectomy.  She has had a previous myomectomy and knows a little bit about this.  Plan to proceed with laparotomy with myomectomy.  Dr. Arline Asp has kindly given his detailed information on how to switch her from Coumadin to Lovenox and when to stop the Lovenox and when to start it back including the doses and she also could be followed in the Coumadin  Clinic for this as well and I have reviewed this with the patient.  The risks of the surgery include but not limited to risk of infection, bleeding, damage to the uterus, tubes, ovary, bowel, bladder, and possibility that they could recur, the bleeding may or may not improve, risks associated with anesthesia certainly with her higher risk for bleeding, the risks associated with blood transfusion, risks associated with general anesthesia and recurrent pulmonary embolism up to and including death. She does give her informed consent and wished to  proceed.  All of her questions were answered.     [name], M.D.     DCL/MEDQ  D:  01/31/2011  T:  01/31/2011  Job:  [id number]

## 2011-01-31 NOTE — H&P (Addendum)
  H&P dictated [id number] 01/31/11 DL  H&P reviewed and is current 02/12/11 DL

## 2011-02-05 ENCOUNTER — Other Ambulatory Visit (HOSPITAL_COMMUNITY): Payer: Self-pay | Admitting: Anesthesiology

## 2011-02-05 ENCOUNTER — Encounter (HOSPITAL_COMMUNITY): Payer: Self-pay

## 2011-02-05 ENCOUNTER — Encounter (HOSPITAL_COMMUNITY)
Admission: RE | Admit: 2011-02-05 | Discharge: 2011-02-05 | Disposition: A | Discharge status: Home / Self Care | Payer: PRIVATE HEALTH INSURANCE | Source: Ambulatory Visit | Attending: Obstetrics and Gynecology | Admitting: Obstetrics and Gynecology

## 2011-02-05 DIAGNOSIS — D259 Leiomyoma of uterus, unspecified: Secondary | ICD-10-CM

## 2011-02-05 LAB — DIFFERENTIAL
Basophils Absolute: 0 10*3/uL (ref 0.0–0.1)
Basophils Relative: 0 % (ref 0–1)
Neutro Abs: 3.3 10*3/uL (ref 1.7–7.7)
Neutrophils Relative %: 55 % (ref 43–77)

## 2011-02-05 LAB — SURGICAL PCR SCREEN
MRSA, PCR: NEGATIVE
Staphylococcus aureus: NEGATIVE

## 2011-02-05 LAB — APTT: aPTT: 44 seconds — ABNORMAL HIGH (ref 24–37)

## 2011-02-05 LAB — PROTIME-INR
INR: 2.07 — ABNORMAL HIGH (ref 0.00–1.49)
Prothrombin Time: 23.7 seconds — ABNORMAL HIGH (ref 11.6–15.2)

## 2011-02-05 LAB — CBC
Hemoglobin: 13.1 g/dL (ref 12.0–15.0)
MCHC: 33.6 g/dL (ref 30.0–36.0)
Platelets: 225 10*3/uL (ref 150–400)
RDW: 13.9 % (ref 11.5–15.5)

## 2011-02-05 NOTE — Pre-Procedure Instructions (Signed)
Dr. Sherron Ales ordered double lumen PICC line for pt's surgery. Appt made for pt to go to [facility] for PICC line on 02/11/11 . Dr. Jean Rosenthal entered radiology order in [facility]. Pt instructed to arrive at [facility] radiiology at 1015 am on 02/11/11

## 2011-02-05 NOTE — Anesthesia Preprocedure Evaluation (Signed)
Anesthesia Evaluation  Name, MR# and DOB Patient awake  General Assessment Comment  Reviewed: Allergy & Precautions, H&P , Patient's Chart, lab work & pertinent test results, reviewed documented beta blocker date and time   Airway Mallampati: III TM Distance: <3 FB Neck ROM: full    Dental No notable dental hx. (+) Caps and Dental Advidsory Given   Pulmonary  sleep apnea (no longer wears CPAP-  ?? improved with gluten free diet??)  clear to auscultation  pulmonary exam normalPulmonary Exam Normal breath sounds clear to auscultation none    Cardiovascular regular Normal    Neuro/Psych   GI/Hepatic/Renal   Endo/Other  (+)   Morbid obesity  Abdominal   Musculoskeletal   Hematology   Peds  Reproductive/Obstetrics    Anesthesia Other Findings             Anesthesia Physical Anesthesia Plan  ASA: III  Anesthesia Plan: General   Post-op Pain Management:    Induction: Intravenous  Airway Management Planned: Oral ETT  Additional Equipment:   Intra-op Plan:   Post-operative Plan:   Informed Consent: I have reviewed the patients History and Physical, chart, labs and discussed the procedure including the risks, benefits and alternatives for the proposed anesthesia with the patient or authorized representative who has indicated his/her understanding and acceptance.   Dental Advisory Given  Plan Discussed with: CRNA and Surgeon  Anesthesia Plan Comments: (Patient scheduled for PICC line  Discussed  general anesthesia, including possible nausea, instrumentation of airway, sore throat,pulmonary aspiration, etc. I asked if the were any outstanding questions, or  concerns before we proceeded. )        Anesthesia Quick Evaluation

## 2011-02-05 NOTE — Patient Instructions (Signed)
18 [name]  02/05/2011   Your procedure is scheduled on:  02/12/11  Report to [facility] at 0600 AM.main entrance desk  Call this number if you have problems the morning of surgery: [phone]   Remember:   Do not eat food:After Midnight.  Do not drink clear liquids: After Midnight.  Take these medicines the morning of surgery with A SIP OF WATER: none   Do not wear jewelry, make-up or nail polish.  Do not wear lotions, powders, or perfumes. You may wear deodorant.  Do not shave 48 hours prior to surgery.  Do not bring valuables to the hospital.  Contacts, dentures or bridgework may not be worn into surgery.  Leave suitcase in the car. After surgery it may be brought to your room.  For patients admitted to the hospital, checkout time is 11:00 AM the day of discharge.   Patients discharged the day of surgery will not be allowed to drive home.  Name and phone number of your driver: Manuella Blackson- [phone]  Special Instructions: CHG Shower Use Special Wash: 1/2 bottle night before surgery and 1/2 bottle morning of surgery.   Please read over the following fact sheets that you were given: none

## 2011-02-05 NOTE — Pre-Procedure Instructions (Signed)
Pt is allergic to gluten and is concerned about postop diet

## 2011-02-08 ENCOUNTER — Encounter: Payer: PRIVATE HEALTH INSURANCE | Admitting: *Deleted

## 2011-02-11 ENCOUNTER — Other Ambulatory Visit (HOSPITAL_COMMUNITY): Payer: Self-pay | Admitting: Anesthesiology

## 2011-02-11 ENCOUNTER — Inpatient Hospital Stay (HOSPITAL_COMMUNITY)
Admission: RE | Admit: 2011-02-11 | Discharge: 2011-02-11 | Payer: PRIVATE HEALTH INSURANCE | Source: Ambulatory Visit | Attending: Anesthesiology | Admitting: Anesthesiology

## 2011-02-11 ENCOUNTER — Ambulatory Visit (HOSPITAL_COMMUNITY)
Admission: RE | Admit: 2011-02-11 | Discharge: 2011-02-11 | Disposition: A | Discharge status: Home / Self Care | Payer: PRIVATE HEALTH INSURANCE | Source: Ambulatory Visit | Attending: Anesthesiology | Admitting: Anesthesiology

## 2011-02-11 DIAGNOSIS — D259 Leiomyoma of uterus, unspecified: Secondary | ICD-10-CM

## 2011-02-11 DIAGNOSIS — Z452 Encounter for adjustment and management of vascular access device: Secondary | ICD-10-CM | POA: Insufficient documentation

## 2011-02-11 MED ORDER — CEFOTETAN DISODIUM 1 G IJ SOLR
1.0000 g | INTRAMUSCULAR | Status: AC
  Administered 2011-02-12: 1 g via INTRAVENOUS
  Filled 2011-02-11: qty 1

## 2011-02-12 ENCOUNTER — Encounter (HOSPITAL_COMMUNITY): Payer: Self-pay | Admitting: Anesthesiology

## 2011-02-12 ENCOUNTER — Encounter (HOSPITAL_COMMUNITY)
Admission: RE | Disposition: A | Discharge status: Home / Self Care | Payer: Self-pay | Source: Ambulatory Visit | Attending: Obstetrics and Gynecology

## 2011-02-12 ENCOUNTER — Encounter (HOSPITAL_COMMUNITY): Payer: Self-pay | Admitting: *Deleted

## 2011-02-12 ENCOUNTER — Inpatient Hospital Stay (HOSPITAL_COMMUNITY): Payer: PRIVATE HEALTH INSURANCE | Admitting: Anesthesiology

## 2011-02-12 ENCOUNTER — Inpatient Hospital Stay (HOSPITAL_COMMUNITY)
Admission: RE | Admit: 2011-02-12 | Discharge: 2011-02-16 | DRG: 742 | Disposition: A | Discharge status: Home / Self Care | Payer: PRIVATE HEALTH INSURANCE | Source: Ambulatory Visit | Attending: Obstetrics and Gynecology | Admitting: Obstetrics and Gynecology

## 2011-02-12 ENCOUNTER — Other Ambulatory Visit: Payer: Self-pay | Admitting: Obstetrics and Gynecology

## 2011-02-12 DIAGNOSIS — IMO0002 Reserved for concepts with insufficient information to code with codable children: Secondary | ICD-10-CM | POA: Diagnosis not present

## 2011-02-12 DIAGNOSIS — Z9889 Other specified postprocedural states: Secondary | ICD-10-CM

## 2011-02-12 DIAGNOSIS — S36499A Other injury of unspecified part of small intestine, initial encounter: Secondary | ICD-10-CM

## 2011-02-12 DIAGNOSIS — I2782 Chronic pulmonary embolism: Secondary | ICD-10-CM | POA: Diagnosis present

## 2011-02-12 DIAGNOSIS — N920 Excessive and frequent menstruation with regular cycle: Principal | ICD-10-CM | POA: Diagnosis present

## 2011-02-12 DIAGNOSIS — Y9210 Unspecified residential institution as the place of occurrence of the external cause: Secondary | ICD-10-CM | POA: Diagnosis not present

## 2011-02-12 DIAGNOSIS — D6859 Other primary thrombophilia: Secondary | ICD-10-CM | POA: Diagnosis present

## 2011-02-12 DIAGNOSIS — N946 Dysmenorrhea, unspecified: Secondary | ICD-10-CM | POA: Diagnosis present

## 2011-02-12 DIAGNOSIS — D259 Leiomyoma of uterus, unspecified: Secondary | ICD-10-CM | POA: Diagnosis present

## 2011-02-12 HISTORY — PX: MYOMECTOMY: SHX85

## 2011-02-12 LAB — CBC
Hemoglobin: 11.1 g/dL — ABNORMAL LOW (ref 12.0–15.0)
MCH: 28 pg (ref 26.0–34.0)
MCV: 84.3 fL (ref 78.0–100.0)
RBC: 3.96 MIL/uL (ref 3.87–5.11)

## 2011-02-12 LAB — SAMPLE TO BLOOD BANK

## 2011-02-12 LAB — PROTIME-INR: INR: 0.84 (ref 0.00–1.49)

## 2011-02-12 SURGERY — MYOMECTOMY, ABDOMINAL APPROACH
Anesthesia: General | Site: Uterus | Wound class: Contaminated

## 2011-02-12 MED ORDER — FENTANYL CITRATE 0.05 MG/ML IJ SOLN
INTRAMUSCULAR | Status: AC
  Filled 2011-02-12: qty 5

## 2011-02-12 MED ORDER — HYDROMORPHONE HCL 1 MG/ML IJ SOLN
0.2000 mg | INTRAMUSCULAR | Status: DC | PRN
  Administered 2011-02-12 – 2011-02-13 (×5): 0.6 mg via INTRAVENOUS
  Filled 2011-02-12 (×5): qty 1

## 2011-02-12 MED ORDER — LIDOCAINE HCL (CARDIAC) 20 MG/ML IV SOLN
INTRAVENOUS | Status: DC | PRN
  Administered 2011-02-12: 60 mg via INTRAVENOUS

## 2011-02-12 MED ORDER — PROPOFOL 10 MG/ML IV EMUL
INTRAVENOUS | Status: AC
  Filled 2011-02-12: qty 20

## 2011-02-12 MED ORDER — GLYCOPYRROLATE 0.2 MG/ML IJ SOLN
INTRAMUSCULAR | Status: DC | PRN
  Administered 2011-02-12: 0.2 mg via INTRAVENOUS

## 2011-02-12 MED ORDER — FENTANYL CITRATE 0.05 MG/ML IJ SOLN
INTRAMUSCULAR | Status: DC | PRN
  Administered 2011-02-12: 100 ug via INTRAVENOUS
  Administered 2011-02-12: 50 ug via INTRAVENOUS
  Administered 2011-02-12 (×2): 100 ug via INTRAVENOUS
  Administered 2011-02-12 (×3): 50 ug via INTRAVENOUS

## 2011-02-12 MED ORDER — SUCCINYLCHOLINE CHLORIDE 20 MG/ML IJ SOLN
INTRAMUSCULAR | Status: AC
  Filled 2011-02-12: qty 1

## 2011-02-12 MED ORDER — SUCCINYLCHOLINE CHLORIDE 20 MG/ML IJ SOLN
INTRAMUSCULAR | Status: DC | PRN
  Administered 2011-02-12: 100 mg via INTRAVENOUS

## 2011-02-12 MED ORDER — GLYCOPYRROLATE 0.2 MG/ML IJ SOLN
INTRAMUSCULAR | Status: AC
Start: 2011-02-12 — End: 2011-02-12
  Filled 2011-02-12: qty 2

## 2011-02-12 MED ORDER — PROPOFOL 10 MG/ML IV EMUL
INTRAVENOUS | Status: DC | PRN
  Administered 2011-02-12: 200 mg via INTRAVENOUS

## 2011-02-12 MED ORDER — ONDANSETRON HCL 4 MG/2ML IJ SOLN
INTRAMUSCULAR | Status: DC | PRN
  Administered 2011-02-12: 4 mg via INTRAVENOUS

## 2011-02-12 MED ORDER — PHENOL 1.4 % MT LIQD
1.0000 | OROMUCOSAL | Status: DC | PRN
  Filled 2011-02-12: qty 177

## 2011-02-12 MED ORDER — DEXAMETHASONE SODIUM PHOSPHATE 10 MG/ML IJ SOLN
INTRAMUSCULAR | Status: AC
  Filled 2011-02-12: qty 1

## 2011-02-12 MED ORDER — DOCUSATE SODIUM 100 MG PO CAPS
100.0000 mg | ORAL_CAPSULE | Freq: Every day | ORAL | Status: DC
  Administered 2011-02-13 – 2011-02-15 (×3): 100 mg via ORAL
  Filled 2011-02-12 (×3): qty 1

## 2011-02-12 MED ORDER — DEXAMETHASONE SODIUM PHOSPHATE 10 MG/ML IJ SOLN
INTRAMUSCULAR | Status: DC | PRN
  Administered 2011-02-12: 10 mg via INTRAVENOUS

## 2011-02-12 MED ORDER — HYDROMORPHONE HCL 1 MG/ML IJ SOLN
INTRAMUSCULAR | Status: DC | PRN
  Administered 2011-02-12: 1 mg via INTRAVENOUS

## 2011-02-12 MED ORDER — IBUPROFEN 600 MG PO TABS
600.0000 mg | ORAL_TABLET | Freq: Four times a day (QID) | ORAL | Status: DC | PRN
  Administered 2011-02-13 – 2011-02-14 (×4): 600 mg via ORAL
  Filled 2011-02-12 (×4): qty 1

## 2011-02-12 MED ORDER — HYDROMORPHONE HCL 1 MG/ML IJ SOLN
INTRAMUSCULAR | Status: AC
  Administered 2011-02-12: 0.5 mg via INTRAVENOUS
  Filled 2011-02-12: qty 1

## 2011-02-12 MED ORDER — HYDROMORPHONE HCL 1 MG/ML IJ SOLN
INTRAMUSCULAR | Status: AC
  Filled 2011-02-12: qty 1

## 2011-02-12 MED ORDER — HYDROMORPHONE HCL 1 MG/ML IJ SOLN
0.2500 mg | INTRAMUSCULAR | Status: DC | PRN
  Administered 2011-02-12 (×4): 0.5 mg via INTRAVENOUS

## 2011-02-12 MED ORDER — ONDANSETRON HCL 4 MG/2ML IJ SOLN
4.0000 mg | Freq: Four times a day (QID) | INTRAMUSCULAR | Status: DC | PRN
  Administered 2011-02-12: 4 mg via INTRAVENOUS
  Filled 2011-02-12: qty 2

## 2011-02-12 MED ORDER — MENTHOL 3 MG MT LOZG: 1.0000 | LOZENGE | OROMUCOSAL | Status: DC | PRN

## 2011-02-12 MED ORDER — MAGNESIUM HYDROXIDE 400 MG/5ML PO SUSP: 30.0000 mL | Freq: Every day | ORAL | Status: DC | PRN

## 2011-02-12 MED ORDER — ALUM & MAG HYDROXIDE-SIMETH 200-200-20 MG/5ML PO SUSP: 30.0000 mL | ORAL | Status: DC | PRN

## 2011-02-12 MED ORDER — ONDANSETRON HCL 4 MG PO TABS: 4.0000 mg | ORAL_TABLET | Freq: Four times a day (QID) | ORAL | Status: DC | PRN

## 2011-02-12 MED ORDER — MIDAZOLAM HCL 2 MG/2ML IJ SOLN
INTRAMUSCULAR | Status: AC
  Filled 2011-02-12: qty 2

## 2011-02-12 MED ORDER — BISACODYL 10 MG RE SUPP: 10.0000 mg | Freq: Every day | RECTAL | Status: DC | PRN

## 2011-02-12 MED ORDER — ZOLPIDEM TARTRATE 5 MG PO TABS: 5.0000 mg | ORAL_TABLET | Freq: Every evening | ORAL | Status: DC | PRN

## 2011-02-12 MED ORDER — LIDOCAINE HCL (CARDIAC) 20 MG/ML IV SOLN
INTRAVENOUS | Status: AC
Start: 2011-02-12 — End: 2011-02-12
  Filled 2011-02-12: qty 5

## 2011-02-12 MED ORDER — EPHEDRINE SULFATE 50 MG/ML IJ SOLN
INTRAMUSCULAR | Status: DC | PRN
  Administered 2011-02-12: 5 mg via INTRAVENOUS

## 2011-02-12 MED ORDER — MICROFIBRILLAR COLL HEMOSTAT EX PADS
MEDICATED_PAD | CUTANEOUS | Status: DC | PRN
  Administered 2011-02-12: 1 via TOPICAL

## 2011-02-12 MED ORDER — MIDAZOLAM HCL 5 MG/5ML IJ SOLN
INTRAMUSCULAR | Status: DC | PRN
  Administered 2011-02-12: 2 mg via INTRAVENOUS

## 2011-02-12 MED ORDER — LACTATED RINGERS IV SOLN
INTRAVENOUS | Status: DC
  Administered 2011-02-12 (×3): via INTRAVENOUS

## 2011-02-12 MED ORDER — SIMETHICONE 80 MG PO CHEW
80.0000 mg | CHEWABLE_TABLET | Freq: Four times a day (QID) | ORAL | Status: DC | PRN
  Administered 2011-02-15 (×2): 80 mg via ORAL

## 2011-02-12 MED ORDER — BISACODYL 5 MG PO TBEC
5.0000 mg | DELAYED_RELEASE_TABLET | Freq: Every day | ORAL | Status: DC | PRN
  Filled 2011-02-12: qty 1

## 2011-02-12 MED ORDER — ONDANSETRON HCL 4 MG/2ML IJ SOLN
INTRAMUSCULAR | Status: AC
  Filled 2011-02-12: qty 2

## 2011-02-12 MED ORDER — DEXTROSE 5 % IV SOLN
1.0000 g | Freq: Two times a day (BID) | INTRAVENOUS | Status: DC
  Administered 2011-02-12 – 2011-02-15 (×6): 1 g via INTRAVENOUS
  Filled 2011-02-12 (×7): qty 1

## 2011-02-12 MED ORDER — NEOSTIGMINE METHYLSULFATE 1 MG/ML IJ SOLN
INTRAMUSCULAR | Status: AC
Start: 2011-02-12 — End: 2011-02-12
  Filled 2011-02-12: qty 10

## 2011-02-12 MED ORDER — ROCURONIUM BROMIDE 50 MG/5ML IV SOLN
INTRAVENOUS | Status: AC
Start: 2011-02-12 — End: 2011-02-12
  Filled 2011-02-12: qty 1

## 2011-02-12 MED ORDER — ROCURONIUM BROMIDE 100 MG/10ML IV SOLN
INTRAVENOUS | Status: DC | PRN
  Administered 2011-02-12 (×2): 10 mg via INTRAVENOUS
  Administered 2011-02-12: 30 mg via INTRAVENOUS
  Administered 2011-02-12: 10 mg via INTRAVENOUS
  Administered 2011-02-12 (×2): 5 mg via INTRAVENOUS

## 2011-02-12 SURGICAL SUPPLY — 33 items
BARRIER ADHS 3X4 INTERCEED (GAUZE/BANDAGES/DRESSINGS) ×2 IMPLANT
CANISTER SUCTION 2500CC (MISCELLANEOUS) ×2 IMPLANT
CLOTH BEACON ORANGE TIMEOUT ST (SAFETY) ×2 IMPLANT
CONT PATH 16OZ SNAP LID 3702 (MISCELLANEOUS) ×2 IMPLANT
DECANTER SPIKE VIAL GLASS SM (MISCELLANEOUS) ×2 IMPLANT
GAUZE SPONGE 4X4 16PLY XRAY LF (GAUZE/BANDAGES/DRESSINGS) ×2 IMPLANT
GLOVE BIO SURGEON STRL SZ8 (GLOVE) ×2 IMPLANT
GLOVE SURG ORTHO 8.0 STRL STRW (GLOVE) ×6 IMPLANT
GOWN STRL REIN XL XLG (GOWN DISPOSABLE) ×6 IMPLANT
HEMOSTAT SURGICEL 2X4 FIBR (HEMOSTASIS) ×2 IMPLANT
NEEDLE HYPO 25X1 1.5 SAFETY (NEEDLE) ×2 IMPLANT
NS IRRIG 1000ML POUR BTL (IV SOLUTION) ×2 IMPLANT
PACK ABDOMINAL GYN (CUSTOM PROCEDURE TRAY) ×2 IMPLANT
PAD OB MATERNITY 4.3X12.25 (PERSONAL CARE ITEMS) ×2 IMPLANT
STAPLER VISISTAT 35W (STAPLE) ×2 IMPLANT
STRIP CLOSURE SKIN 1/4X4 (GAUZE/BANDAGES/DRESSINGS) ×2 IMPLANT
SUT MNCRL 0 MO-4 VIOLET 18 CR (SUTURE) ×2 IMPLANT
SUT MON AB 2-0 CT1 27 (SUTURE) ×2 IMPLANT
SUT MON AB-0 CT1 36 (SUTURE) ×2 IMPLANT
SUT MONOCRYL 0 MO 4 18  CR/8 (SUTURE) ×2
SUT PDS AB 0 CT 36 (SUTURE) IMPLANT
SUT PDS AB 0 CTX 60 (SUTURE) ×2 IMPLANT
SUT PDS AB 3-0 SH 27 (SUTURE) ×8 IMPLANT
SUT PLAIN 2 0 XLH (SUTURE) ×2 IMPLANT
SUT SILK 3 0 SH CR/8 (SUTURE) ×10 IMPLANT
SUT VIC AB 0 CT1 27 (SUTURE) ×2
SUT VIC AB 0 CT1 27XBRD ANBCTR (SUTURE) ×2 IMPLANT
SYR CONTROL 10ML LL (SYRINGE) ×2 IMPLANT
TOWEL OR 17X24 6PK STRL BLUE (TOWEL DISPOSABLE) ×4 IMPLANT
TRAY FOLEY CATH 14FR (SET/KITS/TRAYS/PACK) ×2 IMPLANT
TUBING CONNECTING 10 (TUBING) ×2 IMPLANT
WATER STERILE IRR 1000ML POUR (IV SOLUTION) ×2 IMPLANT
YANKAUER SUCT BULB TIP NO VENT (SUCTIONS) ×2 IMPLANT

## 2011-02-12 NOTE — Anesthesia Procedure Notes (Signed)
Procedure Name: Intubation Date/Time: 02/12/2011 7:49 AM Performed by: Karleen Dolphin Pre-anesthesia Checklist: Patient identified, Emergency Drugs available, Suction available, Patient being monitored and Timeout performed Patient Re-evaluated:Patient Re-evaluated prior to inductionOxygen Delivery Method: Circle System Utilized and Simple face mask Preoxygenation: Pre-oxygenation with 100% oxygen Intubation Type: IV induction Ventilation: Oral airway inserted - appropriate to patient size Grade View: Grade I Tube type: Oral Tube size: 7.0 mm Number of attempts: 1 Airway Equipment and Method: video-laryngoscopy,  oral airway and stylet Placement Confirmation: ETT inserted through vocal cords under direct vision,  positive ETCO2,  CO2 detector and breath sounds checked- equal and bilateral Secured at: 22 cm Dental Injury: Teeth and Oropharynx as per pre-operative assessment  Difficulty Due To: Difficulty was anticipated Future Recommendations: Recommend- induction with short-acting agent, and alternative techniques readily available

## 2011-02-12 NOTE — Anesthesia Postprocedure Evaluation (Signed)
  Anesthesia Post-op Note  Patient: [name]  Procedure(s) Performed:  MYOMECTOMY  Patient Location: PACU and Mother/Baby  Anesthesia Type: General  Level of Consciousness: awake, alert , oriented, patient cooperative and responds to stimulation  Airway and Oxygen Therapy: Patient Spontanous Breathing and Patient connected to nasal cannula oxygen  Post-op Pain: none  Post-op Assessment: Post-op Vital signs reviewed and Patient's Cardiovascular Status Stable  Post-op Vital Signs: Reviewed and stable  Complications: No apparent anesthesia complications

## 2011-02-12 NOTE — Consult Note (Signed)
NAMEROZINA, POINTER               ACCOUNT NO.:  [id number]  MEDICAL RECORD NO.:  [id number]  LOCATION:  [facility]                          FACILITY:  [facility]  PHYSICIAN:  Lennyn Bellanca A. Bridget Westbrooks, M.D.DATE OF BIRTH:  1975-07-08  DATE OF CONSULTATION:  02/12/2011 DATE OF DISCHARGE:                                CONSULTATION   PHYSICIAN REQUESTING CONSULTATION:  Dineen Kid. Rana Snare, MD  REASON FOR CONSULTATION:  Small bowel enterotomy during GYN procedure.  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 Rana Snare.  I was called intraoperatively due to an injury to the small intestine.  DESCRIPTION OF PROCEDURE: I Arrived in the operating room.  The patient was opened through a Pfannenstiel incision it looked like.  Upon inspection, there appeared to be dense intra-abdominal adhesions in the lower abdomen.  There was a loop of bowel with about a 4-cm full- thickness injury to it.  According to the gynecologist, there was no spillage of any succus or stool.  I mobilized this off the anterior abdominal wall a little bit more.  This appeared to be small bowel. The bowel looked like it did not have any tinea, but it was mildly dilated.  She has had history of previous laparoscopic procedures.  I examined the tear and felt this could be repaired primarily.  This appeared to be in the distal small intestine. It appeared to be the ileum.    At this point in time, mobilized what I could and felt that I  had adequate length to close this primarily.  I closed it with a single layer of 3-0 silk suture in a longitudinal fashion since trying to close it transversely  would create too much tension.  The bowel was already dilated and I felt there was ample room for contents to pass through the anastamosis.  The entire length of the tear was easily seen and closed with full-thickness bites of  3-0 silk suture.  Upon examination of this there was no real significant narrowing of the lumen nor tension at the repair.    There was no contaminationwith this injury.   I examined the closure and it appeared to be closed well with no evidence of leakage or narrowing of the small intestine.  She had significant oozing and is on chronic anticoagulation.  , I helped Dr. Rana Snare close and he will dictate this separately as the rest of this procedure.  All final counts at this point of the case were correct.  I told him [facility] would help follow  her while in the hospital.  No complications arose.  Certainly she may need to be transferred to [facility] given her complex medical history and  need for Anticoagulation if she develops complications.  She was stable at this point of the case.I discussed this with her mother and explained the potential complications of this problem with her.  Questions were answered.  Potential risks include abscess,  Fistula,  Obstruction and the need for further surgery.     Norm Wray A. Maxxon Schwanke, M.D.     TAC/MEDQ  D:  02/12/2011  T:  02/12/2011  Job:  [id number]  cc:   Dineen Kid. Rana Snare, M.D. Fax: [phone]

## 2011-02-12 NOTE — Progress Notes (Signed)
Encounter addended by: Marrion Coy on: 02/12/2011  3:48 PM<BR>     Documentation filed: Notes Section

## 2011-02-12 NOTE — Anesthesia Postprocedure Evaluation (Signed)
Anesthesia Post Note  Patient: [name]  Procedure(s) Performed:  MYOMECTOMY  Anesthesia type: GA  Patient location: PACU  Post pain: Pain level controlled  Post assessment: Post-op Vital signs reviewed  Last Vitals:  Filed Vitals:   02/12/11 1115  BP:   Pulse:   Temp:   Resp: 28    Post vital signs: Reviewed  Level of consciousness: sedated  Complications: No apparent anesthesia complications

## 2011-02-12 NOTE — OR Nursing (Signed)
General Surgeon called around 0830 to repair interotomy.

## 2011-02-12 NOTE — Op Note (Signed)
Brief op Note Preop:  Large fibroids, pain, bleeding Postop:  Same + extensive pelvic and bowel adhesions Procedure:  Laparotomy with myomectomy, repair of SB injury (per Dr Luisa Hart) Surgeon:  Rana Snare  ASst:  Jennette Kettle Consult:  T Cornett (surgeon) Anesthesia:  GET EBL: 500 Complications:  Small bowel injury repaired by Dr Luisa Hart Drains:  NG, foley  Candice Camp, MD Dictated (I did not get Dictation number)

## 2011-02-12 NOTE — Transfer of Care (Signed)
Immediate Anesthesia Transfer of Care Note  Patient: [name]  Procedure(s) Performed:  MYOMECTOMY  Patient Location: PACU  Anesthesia Type: General  Level of Consciousness: awake, alert  and oriented  Airway & Oxygen Therapy: Patient Spontanous Breathing and Patient connected to face mask oxygen  Post-op Assessment: Report given to PACU RN and Post -op Vital signs reviewed and stable  Post vital signs: Reviewed and stable  Complications: No apparent anesthesia complications

## 2011-02-12 NOTE — Progress Notes (Signed)
Pt reports gagging and discomfrot from NG and wants it removed. Pain otherwise well controlled VSSAF UOP good Abd soft bandage dry NG easily removed Hgb 11.1  Post op Doing well I reviewed the findings and complications from today and outlined our plan of care for the next few days.  All her questions were answered DL

## 2011-02-12 NOTE — Progress Notes (Signed)
Phone call to Dr. Rana Snare re: Toradol order and NGT flush.  Orders received.  Pt is not to have any Toradol.  Flush NGT with 30cc NS q4 hours.

## 2011-02-13 LAB — CBC
HCT: 30.5 % — ABNORMAL LOW (ref 36.0–46.0)
MCH: 27.8 pg (ref 26.0–34.0)
MCH: 28.4 pg (ref 26.0–34.0)
MCHC: 32.8 g/dL (ref 30.0–36.0)
MCHC: 33.3 g/dL (ref 30.0–36.0)
MCV: 84.7 fL (ref 78.0–100.0)
Platelets: 188 10*3/uL (ref 150–400)
RDW: 14.2 % (ref 11.5–15.5)

## 2011-02-13 MED ORDER — ENOXAPARIN SODIUM 100 MG/ML ~~LOC~~ SOLN
100.0000 mg | SUBCUTANEOUS | Status: DC
  Administered 2011-02-13 – 2011-02-14 (×2): 100 mg via SUBCUTANEOUS
  Filled 2011-02-13 (×3): qty 1

## 2011-02-13 MED ORDER — LACTATED RINGERS IV SOLN
INTRAVENOUS | Status: DC
  Administered 2011-02-13 – 2011-02-14 (×4): via INTRAVENOUS

## 2011-02-13 MED ORDER — DIPHENHYDRAMINE HCL 50 MG/ML IJ SOLN
12.5000 mg | Freq: Four times a day (QID) | INTRAMUSCULAR | Status: DC | PRN
  Administered 2011-02-13 – 2011-02-14 (×2): 12.5 mg via INTRAVENOUS
  Filled 2011-02-13 (×2): qty 1

## 2011-02-13 MED ORDER — HYDROCODONE-ACETAMINOPHEN 5-325 MG PO TABS
1.0000 | ORAL_TABLET | ORAL | Status: DC | PRN
  Administered 2011-02-13 – 2011-02-15 (×11): 1 via ORAL
  Administered 2011-02-15: 2 via ORAL
  Administered 2011-02-16 (×3): 1 via ORAL
  Filled 2011-02-13 (×3): qty 1
  Filled 2011-02-13: qty 2
  Filled 2011-02-13 (×3): qty 1
  Filled 2011-02-13: qty 2
  Filled 2011-02-13 (×7): qty 1

## 2011-02-13 MED ORDER — WARFARIN SODIUM 5 MG PO TABS
5.0000 mg | ORAL_TABLET | Freq: Every day | ORAL | Status: DC
  Administered 2011-02-13 – 2011-02-16 (×4): 5 mg via ORAL
  Filled 2011-02-13 (×5): qty 1

## 2011-02-13 NOTE — H&P (Signed)
[name], [name]               ACCOUNT NO.:  [id number]  MEDICAL RECORD NO.:  [id number]  LOCATION:  9316                          [facility]ACILITY:  [facility]  PHYSICIAN:  Eber Hong, P.A.DATE OF BIRTH:  [birth year]  DATE OF ADMISSION:  02/12/2011 DATE OF DISCHARGE:                             HISTORY & PHYSICAL   SUBJECTIVE:  Sitting up in bed.  OBJECTIVE:  NG was removed yesterday evening at 8 p.m.  The patient denies any nausea or vomiting except with injections of Dilaudid.  She has had no flatus.  No BM.  Abdominal incision is clean and dry, looks quite good.  Vital Signs:  Blood pressure is 122/73, she is afebrile, heart rate 78, respiratory rate is 20.  I and O is not performed.  White count 11,400, hemoglobin 10, hematocrit 30.5.  IMPRESSION: 1. Status post enterotomy with small-bowel repair. 2. Obesity. 3. History of pulmonary embolism. 4. Uterine myoma. 5. Polycystic ovarian disease. 6. Obstructive sleep apnea. 7. Anemia with iron deficiency.  PLAN:  Continue to keep the patient n.p.o., mobilize, and incentive spirometry.  Explained to the patient and the family and the nursing staff to keep her n.p.o. until she has flatus and is showing signs and progress.  We will leave the NG out as long as she has no nausea or vomiting.  She has a recurrence of nausea and vomiting.  She might require replacement of the NG tube.  We will follow daily.     Eber Hong, P.A.     WDJ/MEDQ  D:  02/13/2011  T:  02/13/2011  Job:  [id number]

## 2011-02-13 NOTE — Anesthesia Postprocedure Evaluation (Signed)
Anesthesia Post Note  Patient: [name]  Procedure(s) Performed:  MYOMECTOMY  Anesthesia type: General  Patient location: Mother/Baby  Post pain: Pain level controlled  Post assessment: Post-op Vital signs reviewed  Last Vitals:  Filed Vitals:   02/13/11 0601  BP: 91/61  Pulse: 80  Temp: 97.4 F (36.3 C)  Resp: 20    Post vital signs: Reviewed  Level of consciousness: awake and alert   Complications: No apparent anesthesia complications

## 2011-02-13 NOTE — Progress Notes (Signed)
Pharmacist - Physician Communication Dr: Rana Snare  Concerning: Pharmacy Care Issues Regarding Warfarin Labs  DESCRIPTION: While hospitalized, to be in compliance with the Joint Commission National Patient Safety Goals, all patients on warfarin must have a baseline and/or current protime prior to the administration of warfarin.  Pharmacy has received your order for warfarin without these required laboratory assessments.  RECOMMENDATION: A daily protime for three days has been ordered to meet this safety goal and comply with the current [facility] Pharmacy & Therapeutics Committee policy.  The Pharmacy will defer all warfarin order changes and follow-up of lab results to the prescriber unless a "pharmacy Coumadin protocol" is ordered.  Natasha Bence 02/13/2011

## 2011-02-13 NOTE — Progress Notes (Signed)
1 Day Post-Op Procedure(s): MYOMECTOMY, enterotomy repair  Subjective: Patient reports pain well controlled.  She is hungry  Objective: I have reviewed patient's Hgb 10.0 and vitals are stable UOP good  Resp: clear to auscultation bilaterally GI: incision: clean, dry and intact BS positive Assessment: s/p Procedure(s): MYOMECTOMY: stable  Plan: Encourage ambulation Advance to PO medication Will reassess bowels after ambulation.  Sips for now and could advance later Will restart coumadin today.  Recheck cbc tonight.  If stable, consider restarting Lovenox at 100mg  daily to ultimately d/c home on 160mg  daily with fu in coumadin clinic.   Continue IV abx for now.  No signs of infection  LOS: 1 day    [name] C 02/13/2011, 8:52 AM

## 2011-02-13 NOTE — Progress Notes (Signed)
Pain well controlled with oral vicodan VSSAF Inc cdi Hgb 10.1  Adv to clear liquids Restart lovenox

## 2011-02-13 NOTE — Op Note (Signed)
NAMENESHA, COUNIHAN               ACCOUNT NO.:  [id number]  MEDICAL RECORD NO.:  [id number]  LOCATION:  9316                          [facility]ACILITY:  [facility]  PHYSICIAN:  Dineen Kid. Rana Snare, M.D.    DATE OF BIRTH:  [birth year]  DATE OF PROCEDURE: DATE OF DISCHARGE:                              OPERATIVE REPORT   PREOPERATIVE DIAGNOSES:  Abnormal uterine bleeding, menorrhagia, and large leiomyoma.  POSTOPERATIVE DIAGNOSES:  Abnormal uterine bleeding, menorrhagia, and large leiomyoma.  PROCEDURE:  Laparotomy with myomectomy and repair of incidental small- bowel injury.  SURGEON:  Dineen Kid. Rana Snare, MD  ASSISTANT:  Freddy Finner, MD  Intraoperative consultation with Dr. Harriette Bouillon, General Surgery.  INDICATIONS:  Ms. Ozawa is 35 year old nulligravida white female with a complicated past medical history including history of bilateral pulmonary emboli and a heterozygous prothrombin II disorder, currently anticoagulated.  She has had a history of myomectomy in the past at [facility], but over the last year with worsening in the last month or two, has been again having problems with excessively heavy periods, abnormal uterine bleeding to point of anemia, and dysmenorrhea.  Unable to be a good candidate for hormonal therapy due to her history of pulmonary embolism, she elects for surgical intervention, planned laparotomy with myomectomy due to the fact that she is still having desire to preservation of the uterus.  Risks and benefits of the procedure were discussed at length.  She received thorough preoperative evaluation by her hematologist as well as primary care provider, has been switched from Coumadin to Lovenox and has been off her Lovenox for about 36 hours preoperatively.  The risks and benefits of procedures were discussed at length which include, but not limited to risk of infection, bleeding, damage to the uterus, tubes ovary, bowel, bladder, possibility that this made her bleed,  the bleeding could recur or  it could worsen.  Risks associated with blood transfusion, risks associated with previous history of blood clot and disorder as well as pulmonary emboli were discussed at length.  She does give her informed consent and wished to proceed.  FINDINGS AT THE TIME OF SURGERY:  Dense small bowel adhesions to the anterior abdominal wall as well as bladder and uterine adhesions to the anterior abdominal wall.  The uterus was proximally 12 or 13-week size, was enlarged with 12 or 13 cm fundal fibroid.  The ovaries were not identified due to the dense adhesions.  DESCRIPTION OF PROCEDURE:  After adequate analgesia, the patient placed in the dorsal position with a left lateral tilt.  She is sterilely prepped and draped.  A Pfannenstiel skin incision was made which was taken down sharply to the fascia which was then incised transversely, extended superiorly inferiorly off the bellies of the rectus muscle. Peritoneum was then elevated with 2 hemostats.  A small window was noted.  Metzenbaum scissors were entered and upon placement, they were incised, noted small bowel densely adhered to this, gentle attempts to try to bluntly dissect the bowel from the anterior surface resulted in a 3-4 cm bent in the small bowel.  At this time, Intraoperative General surgery consultation was carried out.  We continued to take down the  window of the peritoneum away from the bowel injury until we were able to identify the intraperitoneal contents.  The injury of the small bowel was noted and a small suture of silk were placed at the angles of the incision of the bowel and it was placed at the corner of the incision up around the way, there has been no contents that could be spilled, packing was placed on top of this area.  A O'Connor-O'Sullivan retractor was then placed along the imbrication of the uterine fundus.  Dense adhesions from the anterior abdominal wall to the bladder and  also around the size of the uterus down to the tubes and ovaries, posteriorly into the cul-de-sac were noted.  Because of the dense adhesions to the bowel and bladder, attempts to further dissect that were not made.  The fundus was then elevated and a vertical incision was made across the fundus of the uterus with Bovie cautery down to the uterine fibroid. The uterine fibroid was then grasped with the towel clamp and sharply dissected from the uterus using a Bovie cautery and also Mayo scissors. After the 12-13 cm fibroid was removed, operator's finger was placed in the space created by the fibroid and was felt to be in the endometrial cavity.  The endometrial lining was noted.  No further fibroids were noted.  A suture of 0 Monocryl was used to reapproximate the endometrial lining along the superior surface of the fundus of the uterus in multiple layers.  The 0 Monocryl suture was used, the figure-of-eights to reapproximate the myometrium and a 3-0 PDS was used to run the serosa across this incision with good approximation noted, good triple layer closure noted and good hemostasis noted.  After care was taken to achieve good hemostasis, no other residual fibroids were noted.  It was felt that further attempts at the visualization of the ovaries would resuscitate further conception and potential bleeding and due to the patient's on recent and which seemed to be anticoagulated state after the surgery, it was felt to leave these above and at this time it will be prudent.  Dr. Harriette Bouillon proceeded with the repair after dissecting the small bowel from the anterior wall, I did help him with this.  After careful evaluation and repair of this area, we systematically evaluated the uterus and cul-de-sac for hemostasis while bleeders near the incision line were noted and figure-of-eight of 0 Monocryl suture were used to close this.  With good hemostasis noted except for small oozing noted from  the peritoneal edges along the uterine wall and pelvic wall from where the previous adhesions were. Surgicel was used in these areas to achieve good hemostasis along with a direct pressure.  Interceed adhesion barrier was placed across the fundus of the uterus to avoid further bowel adhesions to this and after Surgicel placement and reexamination of the areas of dissection, good hemostasis appeared to be achieved.  The peritoneum was then closed with a 2-0 Monocryl suture.  Rectus muscle plicated in midline.  Irrigation applied and meticulous hemostasis was achieved at the rectus muscle and the fascia.  The fascia was then closed with a 0 PDS double loop with good approximation and good hemostasis noted.  The irrigation was once again applied and the Camper's and Scarpa's fascia were reapproximated with interrupted sutures 0 plain suture.  Skin was then stapled with good approximation noted and good hemostasis noted.  The patient tolerated procedure well, was stable on transfer to the recovery room. Sponge  and instrument count was normal x3.  The patient received 1 g of cefotetan preoperatively.  The estimated blood loss was 500 mL.     [name], M.D.     DCL/MEDQ  D:  02/12/2011  T:  02/13/2011  Job:  [id number]

## 2011-02-13 NOTE — Progress Notes (Signed)
UR chart review completed.  

## 2011-02-13 NOTE — Addendum Note (Signed)
Addendum  created 02/13/11 1034 by Doreene Burke   Modules edited:Notes Section

## 2011-02-14 LAB — CBC
Hemoglobin: 9 g/dL — ABNORMAL LOW (ref 12.0–15.0)
MCH: 28.4 pg (ref 26.0–34.0)
MCHC: 33.1 g/dL (ref 30.0–36.0)
MCV: 85.3 fL (ref 78.0–100.0)
Platelets: 150 10*3/uL (ref 150–400)
Platelets: 149 10*3/uL — ABNORMAL LOW (ref 150–400)
RDW: 14.2 % (ref 11.5–15.5)
WBC: 7.3 10*3/uL (ref 4.0–10.5)

## 2011-02-14 LAB — PROTIME-INR: Prothrombin Time: 15.4 seconds — ABNORMAL HIGH (ref 11.6–15.2)

## 2011-02-14 MED ORDER — SODIUM CHLORIDE 0.9 % IJ SOLN
10.0000 mL | Freq: Two times a day (BID) | INTRAMUSCULAR | Status: DC
  Administered 2011-02-14 – 2011-02-15 (×3): 10 mL
  Filled 2011-02-14 (×6): qty 10

## 2011-02-14 MED ORDER — SODIUM CHLORIDE 0.9 % IJ SOLN
10.0000 mL | INTRAMUSCULAR | Status: DC | PRN
  Administered 2011-02-14 – 2011-02-15 (×2): 10 mL
  Filled 2011-02-14: qty 10

## 2011-02-14 NOTE — Progress Notes (Signed)
Pt walking halls VSSAF BS+ Passes flatus and tol clear liquids today  Hgb 8.5  Will slowly adv diet Recheck hgb in am.  Has drifted from10.1 to 8.5 Will cont lovenox clincally stable DL

## 2011-02-14 NOTE — H&P (Signed)
NAMEMONET, NORTH               ACCOUNT NO.:  [id number]  MEDICAL RECORD NO.:  [id number]  LOCATION:                                 FACILITY:  PHYSICIAN:  Eber Hong, P.A.DATE OF BIRTH:  1975-08-22  DATE OF ADMISSION: DATE OF DISCHARGE:                             HISTORY & PHYSICAL   SUBJECTIVE:  The patient is sitting up the chair.  Vital signs shows a blood pressure of 97/62, heart rate is 84, respiratory rate is 17, sats were 100%.  White count 9.7, hemoglobin 9, hematocrit 27, platelets are 150,000.  The patient is still on IV drip at 125 mL per hour.  CURRENT MEDICATIONS: 1. Lovenox. 2. Ceftin. 3. Colace. 4. Coumadin. 5. Vicodin. 6. Ibuprofen.  The patient is taking sips of clear liquids without difficulty.  She is on oral p.o. pain meds and back on Coumadin.  She has ambulated a couple of times.  No flatus.  No BM so far.  She is ambulating.  Overall, she is quite comfortable.  Incision looks good.  IMPRESSION: 1. Status post enterotomy with small bowel repair. 2. Obesity. 3. History of pulmonary embolism, on anticoagulants. 4. Uterine myoma, 5. Polycystic ovarian disease. 6. Obstructive sleep apnea. 7. Iron deficiency anemia.  PLAN:  Continue to mobilize, incentive spirometer.  Recommend going slow with p.o. intake until she has some flatus.  We will continue to follow with you.     Eber Hong, P.A.     WDJ/MEDQ  D:  02/14/2011  T:  02/14/2011  Job:  [id number]

## 2011-02-14 NOTE — Progress Notes (Signed)
1645 Lab drawn  From single lumen PICC line in left upper arm. Site had original gauze drsg covered by plain tegaderm - removed this drsg, cleaned the site & placed a Sorbaview drsg on. Unable to use Statlock d/t sutures. Site unremarkable. 1755 Called to pt room d/t to itching - pt in tears & stated she couldn't stand it any longer. Pt stated that she has very sensitive skin. Drsg removed, site red in rectangular pattern of drsg. Applied tegaderm - pt complaining of itching within minutes of applying this drsg. Called Linda Allred from VAS team to discuss situation. Site redressed with gauze & paper tape - instructed pts RN that drsg will need to be changed in 48hrs. Pt states that it is already starting to feel better. RN will give pt some Benadryl per orders.

## 2011-02-14 NOTE — Progress Notes (Signed)
Pt toleration sips well Pain well controlled VSSAF Hgb 11 BS normactive INC CD+I Will slowly adv diet Some Hgb drift after lovenox Recheck this afternoon Clinically doing well DL

## 2011-02-15 LAB — CBC
Hemoglobin: 8.7 g/dL — ABNORMAL LOW (ref 12.0–15.0)
MCH: 28.5 pg (ref 26.0–34.0)
MCHC: 32.8 g/dL (ref 30.0–36.0)
MCV: 84.7 fL (ref 78.0–100.0)
Platelets: 150 10*3/uL (ref 150–400)
Platelets: 202 10*3/uL (ref 150–400)
RDW: 14.2 % (ref 11.5–15.5)
RDW: 14.1 % (ref 11.5–15.5)
WBC: 5.9 10*3/uL (ref 4.0–10.5)

## 2011-02-15 NOTE — Progress Notes (Signed)
Pt reports flatus and good pain control VSSAF BS+  Inc CD&I Hgb 8.0  Post op doing well clinically Continue to advance diet Watch Hgb drfit.  May need to readdress lovenox/coumadin if continues to decrease DL

## 2011-02-15 NOTE — Progress Notes (Signed)
I spoke with the hematologist on call re: [name] and the Hgb drop after starting the lovenox. The pts history and plan of care by Dr Johnette Abraham were reviewed. He states we can d/c the lovenox and let the patient "drift up" on the coumadin and follow up in the coumadin clinic next week to titre the dose.  The concern is the drop in Hgb and the preventative versus therapeutic treatment with anticoagulants at this time with the potential for continued blood loss after surgery.  This was discussed with the patient and we will d/c lovenox but continue the coumadin and recheck counts in the morning. DL

## 2011-02-16 DIAGNOSIS — Z9889 Other specified postprocedural states: Secondary | ICD-10-CM

## 2011-02-16 LAB — PROTIME-INR
INR: 1.3 (ref 0.00–1.49)
Prothrombin Time: 16.4 seconds — ABNORMAL HIGH (ref 11.6–15.2)

## 2011-02-16 LAB — CBC
MCHC: 33.5 g/dL (ref 30.0–36.0)
Platelets: 204 10*3/uL (ref 150–400)
RDW: 14.1 % (ref 11.5–15.5)

## 2011-02-16 MED ORDER — HYDROCODONE-ACETAMINOPHEN 5-325 MG PO TABS: 1.0000 | ORAL_TABLET | ORAL | Status: DC | PRN

## 2011-02-16 NOTE — Progress Notes (Signed)
D/c instructions reviewed with pt., pt states understanding of same, no home equipment needed, wheelchair to car with staff without incident, d/c'd home with s.o.

## 2011-02-16 NOTE — Discharge Summary (Signed)
  Dictation [id number] DL

## 2011-02-16 NOTE — Progress Notes (Signed)
Pt without complaints Tol regular diet +Flatus and + BM VSSAF Hgb stable at 8.7 BS+ Inc CDI  Plan D/C PICC line and D/C home with fu 2 days at coumadin clinic Staples out now and fu with me in 1-2 weeks DL

## 2011-02-16 NOTE — Discharge Summary (Signed)
NAMEDEONNE, ROOKS               ACCOUNT NO.:  [id number]  MEDICAL RECORD NO.:  [id number]  LOCATION:  9316                          [facility]ACILITY:  [facility]  PHYSICIAN:  Dineen Kid. Rana Snare, M.D.    DATE OF BIRTH:  1975-09-22  DATE OF ADMISSION:  02/12/2011 DATE OF DISCHARGE:  02/16/2011                              DISCHARGE SUMMARY   HISTORY OF PRESENT ILLNESS:  Ms. Cathy is a 35 year old, nulligravida, white female with worsening problems of menorrhagia, dysmenorrhea, and a very large pelvic fibroid measuring 11.2 cm in size.  She has had a myomectomy in the past, continued to have problems with menometrorrhagia, not a candidate for hormonal therapy due to a chronic pulmonary embolism and a prothrombin II gene mutation which is currently anticoagulated.  She has been followed by Dr. Arline Asp and was switched to Lovenox and, has come off that before surgical intervention.  HOSPITAL COURSE:  The patient underwent a laparotomy with a myomectomy. The surgery was complicated by extensive bowel adhesions with incidental enterotomy of small bowel.  Dr. Harriette Bouillon consulted and repaired the small bowel injury as well as removing the significant adhesions. The blood loss at the time of surgery was 500 mL.  The patient's postoperative course was slow progression of bowels due to the enterotomy, but slowly.  Over the 4-day postoperative course advanced to sips and clear liquids to full and finally by postop day #4 was tolerating a regular diet, passing flatus with normoactive bowel sounds and even had 2 bowel movements.  Her hemoglobin postoperatively had dropped to 10 and was stable the following day at 10.1.  We reinstituted the Lovenox therapy as well as Coumadin and subsequently had drop to the hemoglobin to 9.5 and out to 8.5 and then subsequently 8.0.  I consulted with the hematologist on call as the patient was clinically stable and ambulating and tolerating a regular diet, but continued to  have dripping of the hemoglobin without obvious signs of bleeding.  He recommended that we stop the Lovenox, continue with the Coumadin which we did and subsequently hemoglobin stabilized and increased actually to the 8.7. Repeat on postop day #4 showed stable at 8.7, so on postop day #4 the patient was tolerating regular diet, ambulating without difficulty, having normal bowel movements, able to pass flatus.  She remained afebrile throughout the hospital course.  Hemoglobin was stable at 8.7. The staples were removed.  Incision was clean, dry, and intact.  She had very minimal bleeding vaginally.  She had remained on cefotetan empirically through hospital day #3 and also had a normal white count at the time of discharge.  DISPOSITION:  The patient will be discharged home and follow up in my office in 1-2 weeks, to follow up in the Coumadin Clinic in 2-3 days. She is going to continue with Coumadin at 5 mg daily until she is followed up in the Coumadin Clinic.  She is going to take Vicodin as needed.  She is going to avoid ibuprofen.  She will call for increased pain, fever, bleeding, or dizziness.     [name], M.D.    DCL/MEDQ  D:  02/16/2011  T:  02/16/2011  Job:  [id number]

## 2011-02-16 NOTE — H&P (Signed)
[name], [name]               ACCOUNT NO.:  [id number]  MEDICAL RECORD NO.:  [id number]  LOCATION:  9316                          FACILITY:  [facility]  PHYSICIAN:  Lodema Pilot, MD       DATE OF BIRTH:  02/24/1976  DATE OF ADMISSION:  02/12/2011 DATE OF DISCHARGE:  02/16/2011                             HISTORY & PHYSICAL   SUBJECTIVE FINDINGS:  She states that she had been doing well postoperatively.  Her pain is well controlled and she has been tolerating regular diet.  She is passing flatus and actually had moved her bowels twice.  She is ambulatory and overall feels well.  PHYSICAL EXAMINATION:  GENERAL:  She is in no acute distress and nontoxic appearing, sitting comfortably in a chair. VITAL SIGNS:  Her maximum temperature last night was 99.0, current is 98.2, heart rate 75, blood pressure 107/69, respirations 18, and 99% on room air. ABDOMEN:  Soft with appropriate postoperative tenderness.  She is nondistended and her incision is without sign of infection.  She has active bowel sounds.  She is nondistended and no peritoneal signs.  ASSESSMENT:  Status post myomectomy with repair of intraoperative enterotomy.  She is doing very well and tolerating regular diet.  She can advance her diet as tolerated and she should be ready for discharge from General Surgery standpoint when suitable for discharge with her gynecologist.          ______________________________ Lodema Pilot, MD     BL/MEDQ  D:  02/16/2011  T:  02/16/2011  Job:  [id number]

## 2011-02-19 ENCOUNTER — Ambulatory Visit (INDEPENDENT_AMBULATORY_CARE_PROVIDER_SITE_OTHER): Payer: PRIVATE HEALTH INSURANCE | Admitting: *Deleted

## 2011-02-19 ENCOUNTER — Encounter (HOSPITAL_COMMUNITY): Payer: Self-pay | Admitting: Obstetrics and Gynecology

## 2011-02-19 DIAGNOSIS — I2699 Other pulmonary embolism without acute cor pulmonale: Secondary | ICD-10-CM

## 2011-02-19 DIAGNOSIS — Z7901 Long term (current) use of anticoagulants: Secondary | ICD-10-CM

## 2011-02-19 LAB — POCT INR: INR: 2.3

## 2011-02-20 ENCOUNTER — Inpatient Hospital Stay (HOSPITAL_COMMUNITY)
Admission: EM | Admit: 2011-02-20 | Discharge: 2011-04-05 | DRG: 856 | Disposition: A | Discharge status: Home / Self Care | Payer: PRIVATE HEALTH INSURANCE | Attending: Emergency Medicine | Admitting: Emergency Medicine

## 2011-02-20 ENCOUNTER — Emergency Department (HOSPITAL_COMMUNITY): Payer: PRIVATE HEALTH INSURANCE

## 2011-02-20 DIAGNOSIS — F4323 Adjustment disorder with mixed anxiety and depressed mood: Secondary | ICD-10-CM | POA: Diagnosis not present

## 2011-02-20 DIAGNOSIS — T8140XA Infection following a procedure, unspecified, initial encounter: Principal | ICD-10-CM | POA: Diagnosis present

## 2011-02-20 DIAGNOSIS — G4733 Obstructive sleep apnea (adult) (pediatric): Secondary | ICD-10-CM | POA: Diagnosis present

## 2011-02-20 DIAGNOSIS — E876 Hypokalemia: Secondary | ICD-10-CM | POA: Diagnosis not present

## 2011-02-20 DIAGNOSIS — K651 Peritoneal abscess: Secondary | ICD-10-CM | POA: Diagnosis present

## 2011-02-20 DIAGNOSIS — L02219 Cutaneous abscess of trunk, unspecified: Secondary | ICD-10-CM | POA: Diagnosis present

## 2011-02-20 DIAGNOSIS — D689 Coagulation defect, unspecified: Secondary | ICD-10-CM

## 2011-02-20 DIAGNOSIS — Z79899 Other long term (current) drug therapy: Secondary | ICD-10-CM

## 2011-02-20 DIAGNOSIS — D6859 Other primary thrombophilia: Secondary | ICD-10-CM | POA: Diagnosis present

## 2011-02-20 DIAGNOSIS — Z6841 Body Mass Index (BMI) 40.0 and over, adult: Secondary | ICD-10-CM

## 2011-02-20 DIAGNOSIS — Y838 Other surgical procedures as the cause of abnormal reaction of the patient, or of later complication, without mention of misadventure at the time of the procedure: Secondary | ICD-10-CM | POA: Diagnosis present

## 2011-02-20 DIAGNOSIS — D509 Iron deficiency anemia, unspecified: Secondary | ICD-10-CM | POA: Diagnosis present

## 2011-02-20 DIAGNOSIS — Z7901 Long term (current) use of anticoagulants: Secondary | ICD-10-CM

## 2011-02-20 DIAGNOSIS — L03319 Cellulitis of trunk, unspecified: Secondary | ICD-10-CM | POA: Diagnosis present

## 2011-02-20 DIAGNOSIS — T8183XA Persistent postprocedural fistula, initial encounter: Secondary | ICD-10-CM | POA: Diagnosis present

## 2011-02-20 DIAGNOSIS — F341 Dysthymic disorder: Secondary | ICD-10-CM | POA: Diagnosis present

## 2011-02-20 LAB — URINALYSIS, ROUTINE W REFLEX MICROSCOPIC
Bilirubin Urine: NEGATIVE
Glucose, UA: NEGATIVE mg/dL
Ketones, ur: >80 mg/dL — AB
Protein, ur: 30 mg/dL — AB

## 2011-02-20 LAB — URINE MICROSCOPIC-ADD ON

## 2011-02-20 MED ORDER — IOHEXOL 300 MG/ML  SOLN
100.0000 mL | Freq: Once | INTRAMUSCULAR | Status: AC | PRN
  Administered 2011-02-20: 100 mL via INTRAVENOUS

## 2011-02-21 ENCOUNTER — Inpatient Hospital Stay (HOSPITAL_COMMUNITY): Payer: PRIVATE HEALTH INSURANCE

## 2011-02-21 ENCOUNTER — Encounter: Payer: PRIVATE HEALTH INSURANCE | Admitting: *Deleted

## 2011-02-21 LAB — BASIC METABOLIC PANEL
BUN: 4 mg/dL — ABNORMAL LOW (ref 6–23)
CO2: 24 mEq/L (ref 19–32)
Chloride: 101 mEq/L (ref 96–112)
Creatinine, Ser: <0.47 mg/dL — ABNORMAL LOW (ref 0.50–1.10)
Glucose, Bld: 94 mg/dL (ref 70–99)
Potassium: 3.7 mEq/L (ref 3.5–5.1)

## 2011-02-21 LAB — CBC
HCT: 22.6 % — ABNORMAL LOW (ref 36.0–46.0)
HCT: 22.6 % — ABNORMAL LOW (ref 36.0–46.0)
Hemoglobin: 7.5 g/dL — ABNORMAL LOW (ref 12.0–15.0)
MCH: 27.6 pg (ref 26.0–34.0)
MCH: 27.6 pg (ref 26.0–34.0)
MCHC: 33.2 g/dL (ref 30.0–36.0)
MCHC: 33.6 g/dL (ref 30.0–36.0)
MCV: 82.2 fL (ref 78.0–100.0)
Platelets: 328 10*3/uL (ref 150–400)
RBC: 2.72 MIL/uL — ABNORMAL LOW (ref 3.87–5.11)
RDW: 14.7 % (ref 11.5–15.5)

## 2011-02-21 LAB — COMPREHENSIVE METABOLIC PANEL
ALT: 8 U/L (ref 0–35)
AST: 10 U/L (ref 0–37)
Albumin: 2 g/dL — ABNORMAL LOW (ref 3.5–5.2)
Alkaline Phosphatase: 92 U/L (ref 39–117)
BUN: 7 mg/dL (ref 6–23)
Chloride: 99 mEq/L (ref 96–112)
Potassium: 3.3 mEq/L — ABNORMAL LOW (ref 3.5–5.1)
Sodium: 133 mEq/L — ABNORMAL LOW (ref 135–145)
Total Bilirubin: 0.3 mg/dL (ref 0.3–1.2)

## 2011-02-21 LAB — ABO/RH: ABO/RH(D): O POS

## 2011-02-21 LAB — APTT: aPTT: 96 seconds — ABNORMAL HIGH (ref 24–37)

## 2011-02-21 NOTE — Progress Notes (Signed)
UR Chart review completed.  

## 2011-02-22 ENCOUNTER — Other Ambulatory Visit (HOSPITAL_COMMUNITY): Payer: PRIVATE HEALTH INSURANCE

## 2011-02-22 DIAGNOSIS — Z86711 Personal history of pulmonary embolism: Secondary | ICD-10-CM

## 2011-02-22 DIAGNOSIS — N731 Chronic parametritis and pelvic cellulitis: Secondary | ICD-10-CM

## 2011-02-22 DIAGNOSIS — K632 Fistula of intestine: Secondary | ICD-10-CM

## 2011-02-22 LAB — GLUCOSE, CAPILLARY
Glucose-Capillary: 119 mg/dL — ABNORMAL HIGH (ref 70–99)
Glucose-Capillary: 132 mg/dL — ABNORMAL HIGH (ref 70–99)
Glucose-Capillary: 128 mg/dL — ABNORMAL HIGH (ref 70–99)

## 2011-02-22 LAB — COMPREHENSIVE METABOLIC PANEL
ALT: 9 U/L (ref 0–35)
AST: 11 U/L (ref 0–37)
Albumin: 2 g/dL — ABNORMAL LOW (ref 3.5–5.2)
Alkaline Phosphatase: 99 U/L (ref 39–117)
BUN: 5 mg/dL — ABNORMAL LOW (ref 6–23)
CO2: 27 mEq/L (ref 19–32)
Calcium: 7.7 mg/dL — ABNORMAL LOW (ref 8.4–10.5)
Chloride: 100 mEq/L (ref 96–112)
Creatinine, Ser: <0.47 mg/dL — ABNORMAL LOW (ref 0.50–1.10)
Glucose, Bld: 113 mg/dL — ABNORMAL HIGH (ref 70–99)
Potassium: 3.6 mEq/L (ref 3.5–5.1)
Sodium: 135 mEq/L (ref 135–145)
Total Bilirubin: 0.3 mg/dL (ref 0.3–1.2)
Total Protein: 5.8 g/dL — ABNORMAL LOW (ref 6.0–8.3)

## 2011-02-22 LAB — HEPARIN LEVEL (UNFRACTIONATED)
Heparin Unfractionated: <0.1 IU/mL — ABNORMAL LOW (ref 0.30–0.70)
Heparin Unfractionated: 0.27 IU/mL — ABNORMAL LOW (ref 0.30–0.70)

## 2011-02-22 LAB — CBC
Hemoglobin: 8.3 g/dL — ABNORMAL LOW (ref 12.0–15.0)
RBC: 3.03 MIL/uL — ABNORMAL LOW (ref 3.87–5.11)

## 2011-02-22 LAB — DIFFERENTIAL
Basophils Absolute: 0 10*3/uL (ref 0.0–0.1)
Basophils Relative: 0 % (ref 0–1)
Lymphocytes Relative: 14 % (ref 12–46)
Neutro Abs: 4.6 10*3/uL (ref 1.7–7.7)
Neutrophils Relative %: 76 % (ref 43–77)

## 2011-02-22 LAB — PREPARE FRESH FROZEN PLASMA
Unit division: 0
Unit division: 0

## 2011-02-22 LAB — PROTIME-INR
INR: 1.78 — ABNORMAL HIGH (ref 0.00–1.49)
Prothrombin Time: 21 seconds — ABNORMAL HIGH (ref 11.6–15.2)

## 2011-02-22 LAB — APTT: aPTT: 45 seconds — ABNORMAL HIGH (ref 24–37)

## 2011-02-23 LAB — WOUND CULTURE

## 2011-02-23 LAB — PREPARE FRESH FROZEN PLASMA: Unit division: 0

## 2011-02-23 LAB — BASIC METABOLIC PANEL
CO2: 25 mEq/L (ref 19–32)
Chloride: 104 mEq/L (ref 96–112)
Creatinine, Ser: <0.47 mg/dL — ABNORMAL LOW (ref 0.50–1.10)
Glucose, Bld: 130 mg/dL — ABNORMAL HIGH (ref 70–99)
Potassium: 4.1 mEq/L (ref 3.5–5.1)
Sodium: 135 mEq/L (ref 135–145)

## 2011-02-23 LAB — CBC
Hemoglobin: 7.8 g/dL — ABNORMAL LOW (ref 12.0–15.0)
MCH: 28 pg (ref 26.0–34.0)
MCV: 84.9 fL (ref 78.0–100.0)
RBC: 2.79 MIL/uL — ABNORMAL LOW (ref 3.87–5.11)

## 2011-02-23 LAB — GLUCOSE, CAPILLARY
Glucose-Capillary: 135 mg/dL — ABNORMAL HIGH (ref 70–99)
Glucose-Capillary: 132 mg/dL — ABNORMAL HIGH (ref 70–99)

## 2011-02-23 LAB — HEPARIN LEVEL (UNFRACTIONATED)
Heparin Unfractionated: <0.1 IU/mL — ABNORMAL LOW (ref 0.30–0.70)
Heparin Unfractionated: <0.1 IU/mL — ABNORMAL LOW (ref 0.30–0.70)

## 2011-02-23 LAB — PROTIME-INR: Prothrombin Time: 17.3 seconds — ABNORMAL HIGH (ref 11.6–15.2)

## 2011-02-23 NOTE — Op Note (Signed)
NAMEMCKINZEE, SPIRITO               ACCOUNT NO.:  [id number]  MEDICAL RECORD NO.:  [id number]  LOCATION:  1229                         FACILITY:  [facility]  PHYSICIAN:  Angelia Mould. Derrell Lolling, M.D.DATE OF BIRTH:  19-Oct-1975  DATE OF PROCEDURE: DATE OF DISCHARGE:                              OPERATIVE REPORT   DATE OF SURGERY:  February 22, 2011  PREOPERATIVE DIAGNOSIS:  Abdominal abscess and colocutaneous fistula.  POSTOPERATIVE DIAGNOSIS:  Abdominal abscess and colocutaneous fistula.  OPERATION PERFORMED:  Abdominal wound exploration, removal of sutures, peritoneal lavage, placement of wound drains.  SURGEON:  Angelia Mould. Derrell Lolling, M.D.  OPERATIVE INDICATIONS:  This is a 35 year old female who has a past history of uterine myomectomy.  She recently underwent elective laparotomy by Dr. Candice Camp for planned uterine myomectomy. This was her second elective myomectomy.  This operation was complicated with intraoperative bowel injury, which was full-thickness.  Dr. Maisie Fus Cornett repaired that injury, but noted distorted anatomy and some bleeding, and several pieces of surgicel gauze were placed.  The patient did well for about a week and actually went home and came back with abdominal pain and enteric drainage from her wound.  She was admitted to the hospital here almost 48 hours ago with cellulitis of her wound.  We opened up her wound in the ER and noticed enteric drainage in the wound.  CT scan shows pelvic fluid collections, some air bubbles.  She has been started on antibiotics.  She has a coagulation defect, a hypercoagulable state,  is on Coumadin, is anticoagulated and is not a good candidate for surgical intervention or percutaneous drainage   I have had extensive discussions with Dr. Luisa Hart and other members of my group.  It is felt highly likely that her abdomen is mostly frozen from acute and chronic inflammatory conditions and that exploratory laparotomy for definitive repair  is not safe or feasible at this time.  It is our goal to provide adequate egress of the enteric content through the wound and to control sepsis in hopes that this will heal or become controlled.  There is a fair amount of drainage into the wound and so she is brought to the operating room to open the wound up, wash things out, and try to place drains to control the drainage.  OPERATIVE TECHNIQUE:  Following the induction of general endotracheal anesthesia, the patient's abdomen was prepped and draped in sterile fashion.  The Kerlix gauze was removed from the wound.  There was dark brown viscous fluid in the wound, suggesting a colonic source.  I made sure that the entire subcutaneous tissue was opened up all the way down to the transverse fascial closure.  There were some loose sutures in the fascia, which I removed and opened up the fascial closure from a little bit to the left of midline all the way laterally.  The stool appeared to be coming from a single source on the right side, above the upper edge of the fascia.  This was all washed out.  I could not get into any free space, but simply washed this out.  I placed a 30-French latex rubber tube.  I had cut multiple side holes  in the base of the wound, brought it out through the left lateral side of the wound and connected it to suction and sutured to the skin. This seemed to work fairly well to control the drainage.  I placed a moistened Kerlix in the wound on top of the drain.  It continued to sump and drain fairly well.  Clean bandages were placed and the patient taken to the recovery room in stable condition. Estimated blood loss, 50 mL.  Complications none.  Sponge, needle and instrument counts were correct.     Angelia Mould. Derrell Lolling, M.D.     HMI/MEDQ  D:  02/22/2011  T:  02/22/2011  Job:  [id number]  cc:   Dineen Kid. Rana Snare, M.D. Fax: [phone]  Clovis Pu. Cornett, M.D. [address] [place]  [address]  Electronically Signed by Claud Kelp M.D. on 02/23/2011 08:24:07 AM

## 2011-02-24 LAB — CBC
MCH: 28 pg (ref 26.0–34.0)
MCHC: 33.2 g/dL (ref 30.0–36.0)
MCV: 84.3 fL (ref 78.0–100.0)
Platelets: 420 10*3/uL — ABNORMAL HIGH (ref 150–400)
RBC: 2.86 MIL/uL — ABNORMAL LOW (ref 3.87–5.11)
RDW: 14.7 % (ref 11.5–15.5)

## 2011-02-24 LAB — HEPARIN LEVEL (UNFRACTIONATED)
Heparin Unfractionated: 0.34 IU/mL (ref 0.30–0.70)
Heparin Unfractionated: 0.28 IU/mL — ABNORMAL LOW (ref 0.30–0.70)

## 2011-02-24 LAB — GLUCOSE, CAPILLARY: Glucose-Capillary: 115 mg/dL — ABNORMAL HIGH (ref 70–99)

## 2011-02-25 DIAGNOSIS — E46 Unspecified protein-calorie malnutrition: Secondary | ICD-10-CM

## 2011-02-25 LAB — COMPREHENSIVE METABOLIC PANEL
ALT: 14 U/L (ref 0–35)
Alkaline Phosphatase: 121 U/L — ABNORMAL HIGH (ref 39–117)
BUN: 8 mg/dL (ref 6–23)
CO2: 28 mEq/L (ref 19–32)
Calcium: 9 mg/dL (ref 8.4–10.5)
Glucose, Bld: 121 mg/dL — ABNORMAL HIGH (ref 70–99)
Potassium: 4.1 mEq/L (ref 3.5–5.1)
Total Protein: 5.8 g/dL — ABNORMAL LOW (ref 6.0–8.3)

## 2011-02-25 LAB — GLUCOSE, CAPILLARY
Glucose-Capillary: 103 mg/dL — ABNORMAL HIGH (ref 70–99)
Glucose-Capillary: 102 mg/dL — ABNORMAL HIGH (ref 70–99)

## 2011-02-25 LAB — CBC
HCT: 25.5 % — ABNORMAL LOW (ref 36.0–46.0)
Hemoglobin: 8.3 g/dL — ABNORMAL LOW (ref 12.0–15.0)
MCH: 27.4 pg (ref 26.0–34.0)
MCHC: 32.5 g/dL (ref 30.0–36.0)
RBC: 3.03 MIL/uL — ABNORMAL LOW (ref 3.87–5.11)

## 2011-02-25 LAB — DIFFERENTIAL
Basophils Relative: 1 % (ref 0–1)
Eosinophils Absolute: 0.2 10*3/uL (ref 0.0–0.7)
Eosinophils Relative: 3 % (ref 0–5)
Monocytes Absolute: 0.5 10*3/uL (ref 0.1–1.0)
Neutro Abs: 4 10*3/uL (ref 1.7–7.7)

## 2011-02-25 LAB — CROSSMATCH
ABO/RH(D): O POS
Antibody Screen: NEGATIVE
Unit division: 0

## 2011-02-25 LAB — BASIC METABOLIC PANEL
BUN: 8 mg/dL (ref 6–23)
CO2: 27 mEq/L (ref 19–32)
Chloride: 100 mEq/L (ref 96–112)
Creatinine, Ser: <0.47 mg/dL — ABNORMAL LOW (ref 0.50–1.10)
Glucose, Bld: 110 mg/dL — ABNORMAL HIGH (ref 70–99)
Potassium: 4.3 mEq/L (ref 3.5–5.1)

## 2011-02-25 LAB — HEPARIN LEVEL (UNFRACTIONATED)
Heparin Unfractionated: 0.68 IU/mL (ref 0.30–0.70)
Heparin Unfractionated: 0.78 IU/mL — ABNORMAL HIGH (ref 0.30–0.70)

## 2011-02-25 LAB — PREALBUMIN: Prealbumin: 10.7 mg/dL — ABNORMAL LOW (ref 17.0–34.0)

## 2011-02-25 LAB — ANAEROBIC CULTURE

## 2011-02-25 LAB — TRIGLYCERIDES: Triglycerides: 123 mg/dL (ref <150)

## 2011-02-25 LAB — MAGNESIUM: Magnesium: 2 mg/dL (ref 1.5–2.5)

## 2011-02-26 ENCOUNTER — Encounter: Payer: PRIVATE HEALTH INSURANCE | Admitting: *Deleted

## 2011-02-26 LAB — HEPARIN LEVEL (UNFRACTIONATED)
Heparin Unfractionated: 0.74 IU/mL — ABNORMAL HIGH (ref 0.30–0.70)
Heparin Unfractionated: 0.13 IU/mL — ABNORMAL LOW (ref 0.30–0.70)
Heparin Unfractionated: 0.4 IU/mL (ref 0.30–0.70)

## 2011-02-26 LAB — BASIC METABOLIC PANEL
BUN: 7 mg/dL (ref 6–23)
Calcium: 8.6 mg/dL (ref 8.4–10.5)
Chloride: 103 mEq/L (ref 96–112)
Creatinine, Ser: <0.47 mg/dL — ABNORMAL LOW (ref 0.50–1.10)

## 2011-02-26 LAB — GLUCOSE, CAPILLARY
Glucose-Capillary: 107 mg/dL — ABNORMAL HIGH (ref 70–99)
Glucose-Capillary: 114 mg/dL — ABNORMAL HIGH (ref 70–99)
Glucose-Capillary: 127 mg/dL — ABNORMAL HIGH (ref 70–99)

## 2011-02-26 LAB — CBC
Hemoglobin: 8.2 g/dL — ABNORMAL LOW (ref 12.0–15.0)
MCH: 27.1 pg (ref 26.0–34.0)
MCHC: 32.3 g/dL (ref 30.0–36.0)
RDW: 14.8 % (ref 11.5–15.5)

## 2011-02-26 LAB — PHOSPHORUS: Phosphorus: 4.5 mg/dL (ref 2.3–4.6)

## 2011-02-27 ENCOUNTER — Inpatient Hospital Stay (HOSPITAL_COMMUNITY): Payer: PRIVATE HEALTH INSURANCE

## 2011-02-27 LAB — CBC
HCT: 24.6 % — ABNORMAL LOW (ref 36.0–46.0)
MCHC: 32.9 g/dL (ref 30.0–36.0)
MCV: 83.7 fL (ref 78.0–100.0)
Platelets: 470 10*3/uL — ABNORMAL HIGH (ref 150–400)
RDW: 14.7 % (ref 11.5–15.5)

## 2011-02-27 LAB — GLUCOSE, CAPILLARY: Glucose-Capillary: 111 mg/dL — ABNORMAL HIGH (ref 70–99)

## 2011-02-27 LAB — BASIC METABOLIC PANEL
Chloride: 103 mEq/L (ref 96–112)
Potassium: 3.5 mEq/L (ref 3.5–5.1)

## 2011-02-27 LAB — HEPARIN LEVEL (UNFRACTIONATED): Heparin Unfractionated: 0.43 IU/mL (ref 0.30–0.70)

## 2011-02-27 MED ORDER — IOHEXOL 300 MG/ML  SOLN
100.0000 mL | Freq: Once | INTRAMUSCULAR | Status: AC | PRN
  Administered 2011-02-27: 100 mL via INTRAVENOUS

## 2011-02-28 LAB — HEPARIN LEVEL (UNFRACTIONATED)
Heparin Unfractionated: 0.77 IU/mL — ABNORMAL HIGH (ref 0.30–0.70)
Heparin Unfractionated: 0.68 IU/mL (ref 0.30–0.70)

## 2011-02-28 LAB — COMPREHENSIVE METABOLIC PANEL
ALT: 22 U/L (ref 0–35)
Calcium: 8.9 mg/dL (ref 8.4–10.5)
GFR calc Af Amer: >60 mL/min (ref >60)
Glucose, Bld: 116 mg/dL — ABNORMAL HIGH (ref 70–99)
Sodium: 139 mEq/L (ref 135–145)
Total Protein: 6.1 g/dL (ref 6.0–8.3)

## 2011-02-28 LAB — GLUCOSE, CAPILLARY
Glucose-Capillary: 104 mg/dL — ABNORMAL HIGH (ref 70–99)
Glucose-Capillary: 121 mg/dL — ABNORMAL HIGH (ref 70–99)

## 2011-02-28 LAB — CBC
MCV: 84.5 fL (ref 78.0–100.0)
Platelets: 449 10*3/uL — ABNORMAL HIGH (ref 150–400)
RDW: 14.9 % (ref 11.5–15.5)
WBC: 5.3 10*3/uL (ref 4.0–10.5)

## 2011-02-28 LAB — PHOSPHORUS: Phosphorus: 5.2 mg/dL — ABNORMAL HIGH (ref 2.3–4.6)

## 2011-03-01 ENCOUNTER — Inpatient Hospital Stay (HOSPITAL_COMMUNITY): Payer: PRIVATE HEALTH INSURANCE

## 2011-03-01 LAB — CBC
MCH: 27.6 pg (ref 26.0–34.0)
MCHC: 33.2 g/dL (ref 30.0–36.0)
Platelets: 380 10*3/uL (ref 150–400)
RBC: 2.93 MIL/uL — ABNORMAL LOW (ref 3.87–5.11)
RDW: 14.8 % (ref 11.5–15.5)

## 2011-03-01 LAB — BASIC METABOLIC PANEL
BUN: 11 mg/dL (ref 6–23)
Calcium: 8.5 mg/dL (ref 8.4–10.5)
Creatinine, Ser: 0.48 mg/dL — ABNORMAL LOW (ref 0.50–1.10)
GFR calc Af Amer: >60 mL/min (ref >60)
GFR calc non Af Amer: >60 mL/min (ref >60)
Potassium: 3.3 mEq/L — ABNORMAL LOW (ref 3.5–5.1)

## 2011-03-01 LAB — HEPARIN LEVEL (UNFRACTIONATED): Heparin Unfractionated: 0.36 IU/mL (ref 0.30–0.70)

## 2011-03-02 LAB — CBC
HCT: 24.5 % — ABNORMAL LOW (ref 36.0–46.0)
Hemoglobin: 7.9 g/dL — ABNORMAL LOW (ref 12.0–15.0)
MCH: 27.3 pg (ref 26.0–34.0)
MCHC: 32.2 g/dL (ref 30.0–36.0)
MCV: 84.8 fL (ref 78.0–100.0)
RDW: 14.8 % (ref 11.5–15.5)

## 2011-03-02 LAB — BASIC METABOLIC PANEL
BUN: 12 mg/dL (ref 6–23)
CO2: 27 mEq/L (ref 19–32)
Calcium: 8.6 mg/dL (ref 8.4–10.5)
Glucose, Bld: 100 mg/dL — ABNORMAL HIGH (ref 70–99)

## 2011-03-02 LAB — GLUCOSE, CAPILLARY: Glucose-Capillary: 112 mg/dL — ABNORMAL HIGH (ref 70–99)

## 2011-03-03 DIAGNOSIS — D649 Anemia, unspecified: Secondary | ICD-10-CM

## 2011-03-03 LAB — HEPARIN LEVEL (UNFRACTIONATED): Heparin Unfractionated: 0.1 IU/mL — ABNORMAL LOW (ref 0.30–0.70)

## 2011-03-03 LAB — GLUCOSE, CAPILLARY
Glucose-Capillary: 102 mg/dL — ABNORMAL HIGH (ref 70–99)
Glucose-Capillary: 110 mg/dL — ABNORMAL HIGH (ref 70–99)
Glucose-Capillary: 98 mg/dL (ref 70–99)

## 2011-03-03 LAB — BASIC METABOLIC PANEL
BUN: 12 mg/dL (ref 6–23)
Chloride: 104 mEq/L (ref 96–112)
Glucose, Bld: 116 mg/dL — ABNORMAL HIGH (ref 70–99)
Potassium: 3.6 mEq/L (ref 3.5–5.1)
Sodium: 140 mEq/L (ref 135–145)

## 2011-03-03 LAB — CBC
HCT: 25 % — ABNORMAL LOW (ref 36.0–46.0)
Hemoglobin: 8.1 g/dL — ABNORMAL LOW (ref 12.0–15.0)
RDW: 14.6 % (ref 11.5–15.5)
WBC: 5.8 10*3/uL (ref 4.0–10.5)

## 2011-03-04 DIAGNOSIS — K632 Fistula of intestine: Secondary | ICD-10-CM

## 2011-03-04 HISTORY — PX: COLOSTOMY: SHX63

## 2011-03-04 HISTORY — DX: Fistula of intestine: K63.2

## 2011-03-04 LAB — COMPREHENSIVE METABOLIC PANEL
AST: 18 U/L (ref 0–37)
Albumin: 2.3 g/dL — ABNORMAL LOW (ref 3.5–5.2)
BUN: 12 mg/dL (ref 6–23)
Calcium: 8.7 mg/dL (ref 8.4–10.5)
Creatinine, Ser: 0.47 mg/dL — ABNORMAL LOW (ref 0.50–1.10)
Total Protein: 6.3 g/dL (ref 6.0–8.3)

## 2011-03-04 LAB — DIFFERENTIAL
Eosinophils Absolute: 0.2 10*3/uL (ref 0.0–0.7)
Eosinophils Relative: 3 % (ref 0–5)
Lymphs Abs: 2.6 10*3/uL (ref 0.7–4.0)
Monocytes Absolute: 0.3 10*3/uL (ref 0.1–1.0)
Monocytes Relative: 4 % (ref 3–12)

## 2011-03-04 LAB — GLUCOSE, CAPILLARY
Glucose-Capillary: 125 mg/dL — ABNORMAL HIGH (ref 70–99)
Glucose-Capillary: 115 mg/dL — ABNORMAL HIGH (ref 70–99)

## 2011-03-04 LAB — CBC
HCT: 25.3 % — ABNORMAL LOW (ref 36.0–46.0)
MCH: 27.3 pg (ref 26.0–34.0)
MCHC: 32.8 g/dL (ref 30.0–36.0)
MCV: 83.2 fL (ref 78.0–100.0)
Platelets: 331 10*3/uL (ref 150–400)
RDW: 14.5 % (ref 11.5–15.5)
WBC: 6.8 10*3/uL (ref 4.0–10.5)

## 2011-03-04 LAB — TRIGLYCERIDES: Triglycerides: 88 mg/dL (ref <150)

## 2011-03-04 LAB — MAGNESIUM: Magnesium: 1.8 mg/dL (ref 1.5–2.5)

## 2011-03-04 LAB — PHOSPHORUS: Phosphorus: 4.8 mg/dL — ABNORMAL HIGH (ref 2.3–4.6)

## 2011-03-04 LAB — HEPARIN LEVEL (UNFRACTIONATED): Heparin Unfractionated: 1.62 IU/mL — ABNORMAL HIGH (ref 0.30–0.70)

## 2011-03-05 LAB — CBC
HCT: 26 % — ABNORMAL LOW (ref 36.0–46.0)
Hemoglobin: 8.4 g/dL — ABNORMAL LOW (ref 12.0–15.0)
MCH: 27.3 pg (ref 26.0–34.0)
MCHC: 32.3 g/dL (ref 30.0–36.0)
RBC: 3.08 MIL/uL — ABNORMAL LOW (ref 3.87–5.11)

## 2011-03-05 LAB — BASIC METABOLIC PANEL
BUN: 15 mg/dL (ref 6–23)
Calcium: 8.4 mg/dL (ref 8.4–10.5)
Chloride: 103 mEq/L (ref 96–112)
Creatinine, Ser: 0.48 mg/dL — ABNORMAL LOW (ref 0.50–1.10)
GFR calc Af Amer: >90 mL/min (ref >90)

## 2011-03-05 LAB — GLUCOSE, CAPILLARY: Glucose-Capillary: 119 mg/dL — ABNORMAL HIGH (ref 70–99)

## 2011-03-06 LAB — BASIC METABOLIC PANEL
CO2: 26 mEq/L (ref 19–32)
Calcium: 8.9 mg/dL (ref 8.4–10.5)
Creatinine, Ser: <0.47 mg/dL — ABNORMAL LOW (ref 0.50–1.10)
Glucose, Bld: 109 mg/dL — ABNORMAL HIGH (ref 70–99)

## 2011-03-06 LAB — CBC
Hemoglobin: 8.6 g/dL — ABNORMAL LOW (ref 12.0–15.0)
MCH: 26.9 pg (ref 26.0–34.0)
MCHC: 32.1 g/dL (ref 30.0–36.0)
MCV: 83.8 fL (ref 78.0–100.0)
Platelets: 318 10*3/uL (ref 150–400)
RBC: 3.2 MIL/uL — ABNORMAL LOW (ref 3.87–5.11)

## 2011-03-07 LAB — COMPREHENSIVE METABOLIC PANEL
BUN: 15 mg/dL (ref 6–23)
CO2: 26 mEq/L (ref 19–32)
Chloride: 102 mEq/L (ref 96–112)
Creatinine, Ser: <0.47 mg/dL — ABNORMAL LOW (ref 0.50–1.10)
Glucose, Bld: 112 mg/dL — ABNORMAL HIGH (ref 70–99)
Total Bilirubin: 0.3 mg/dL (ref 0.3–1.2)

## 2011-03-07 LAB — CBC
HCT: 27.2 % — ABNORMAL LOW (ref 36.0–46.0)
MCH: 27.2 pg (ref 26.0–34.0)
MCV: 84 fL (ref 78.0–100.0)
RBC: 3.24 MIL/uL — ABNORMAL LOW (ref 3.87–5.11)
WBC: 7.2 10*3/uL (ref 4.0–10.5)

## 2011-03-07 LAB — GLUCOSE, CAPILLARY

## 2011-03-07 LAB — PHOSPHORUS: Phosphorus: 4.7 mg/dL — ABNORMAL HIGH (ref 2.3–4.6)

## 2011-03-07 LAB — MAGNESIUM: Magnesium: 1.6 mg/dL (ref 1.5–2.5)

## 2011-03-08 LAB — BASIC METABOLIC PANEL
CO2: 27 mEq/L (ref 19–32)
Calcium: 8.8 mg/dL (ref 8.4–10.5)
Glucose, Bld: 117 mg/dL — ABNORMAL HIGH (ref 70–99)
Sodium: 140 mEq/L (ref 135–145)

## 2011-03-08 LAB — GLUCOSE, CAPILLARY
Glucose-Capillary: 137 mg/dL — ABNORMAL HIGH (ref 70–99)
Glucose-Capillary: 116 mg/dL — ABNORMAL HIGH (ref 70–99)

## 2011-03-08 LAB — CBC
HCT: 27 % — ABNORMAL LOW (ref 36.0–46.0)
Hemoglobin: 8.8 g/dL — ABNORMAL LOW (ref 12.0–15.0)
MCH: 26.8 pg (ref 26.0–34.0)
MCV: 82.3 fL (ref 78.0–100.0)
RBC: 3.28 MIL/uL — ABNORMAL LOW (ref 3.87–5.11)

## 2011-03-09 LAB — BASIC METABOLIC PANEL
Calcium: 9 mg/dL (ref 8.4–10.5)
Sodium: 138 mEq/L (ref 135–145)

## 2011-03-09 LAB — CBC
MCH: 27.2 pg (ref 26.0–34.0)
MCHC: 32.8 g/dL (ref 30.0–36.0)
Platelets: 254 10*3/uL (ref 150–400)
RBC: 3.13 MIL/uL — ABNORMAL LOW (ref 3.87–5.11)

## 2011-03-09 LAB — GLUCOSE, CAPILLARY: Glucose-Capillary: 113 mg/dL — ABNORMAL HIGH (ref 70–99)

## 2011-03-09 LAB — PHOSPHORUS: Phosphorus: 4.5 mg/dL (ref 2.3–4.6)

## 2011-03-09 LAB — MAGNESIUM: Magnesium: 1.9 mg/dL (ref 1.5–2.5)

## 2011-03-09 NOTE — H&P (Signed)
[name], [name]               ACCOUNT NO.:  [id number]  MEDICAL RECORD NO.:  [id number]  LOCATION:  WLED                         FACILITY:  [facility]  PHYSICIAN:  Angelia Mould. Derrell Lolling, M.D.DATE OF BIRTH:  1975-08-26  DATE OF ADMISSION:  02/20/2011 DATE OF DISCHARGE:                             HISTORY & PHYSICAL   PRIMARY CARE PHYSICIANS:  Valerie A. Felicity Coyer, MD; OB/GYN, Dr. Rana Snare; and Hematology/Oncology, Dr. Arline Asp.  REASON FOR ADMISSION:  Fluid gushing out from her abdominal wound, reddish brown, then later greenish brown.  BRIEF HISTORY:  The patient is a 35 year old female who underwent myomectomy on February 12, 2011, which was complicated with an intraoperative enterotomy.  Postoperatively, she was doing well and was actually discharged over the weekend on February 16, 2011.  The patient reports she was doing well up until this a.m.  She does note that she started having some fever last night which she attributed to constipation.  She has had some constipation, was taking one dose of MiraLax.  This morning when she got up for the first time, she had fluid come out of her incision more on the left side than the right.  She said it was initially reddish brown, and then later, she had some that was green color.  She subsequently went to Dr. Diamantina Monks office to examine her and after discussions with Dr. Luisa Hart was sent to the emergency room at [facility].  PAST MEDICAL HISTORY: 1. Menorrhagia, dysmenorrhea, fibroids, and history of polycystic     ovarian disease. 2. Prothrombin II gene mutation, Dr. Arline Asp.  Pulmonary embolus in     September 2010, on Coumadin. 3. Obstructive sleep apnea, questionable secondary to gluten allergy.     She is not on CPAP. 4. Iron deficiency anemia. 5. Obesity.  She is 58 inches tall, 233 pounds. 6. History of bronchitis.  PAST SURGICAL HISTORY: 1. Myomectomy as noted above. 2. Fibroid surgery in 2004.  FAMILY HISTORY:   Mother is fine.  Father has history of alcoholism.  No siblings.  SOCIAL HISTORY:  No tobacco, alcohol, or drugs.  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  REVIEW OF SYSTEMS:  GI:  Positive for constipation.  Since surgery, she gets fevers with constipation.  She attribute fevers earlier to this. States that has been going on for years.  Constipation every 2-3 months currently, prior to surgery.  Negative for GERD.  Negative for nausea and vomiting.  Skin changes this a.m.  CV:  Positive for dizziness, last 24 hours walking in the bathroom.  PULMONARY:  No CPAP.  No orthopnea. No PND.  No dyspnea on exertion.  CARDIAC:  Negative.  GU:  Hurts to void currently.  EXTREMITIES:  Lower Extremities, no edema.  CURRENT MEDICATIONS: 1. Vicodin. 2. Coumadin 7.5 mg daily. 3. Ferrous sulfate 325 mg daily. 4. Multivitamin.  ALLERGIES:  She has a GLUTEN allergy.  OXYCODONE causes tachycardia.  PHYSICAL EXAMINATION:  GENERAL:  The patient is 58 inches and 233 pounds.  Obese white female, in no acute distress.  She has pain with movement and voiding. VITAL SIGNS:  Temperature is 97.5, heart rate is 98, blood pressure is 105/65, respiratory rate is  18, and sats are 98% on room air. HEAD:  Normocephalic.  Eyes, ears, nose and throat are within normal limits. NECK:  Trachea is in the midline.  No bruits.  No JVD.  No thyromegaly. CHEST:  Clear to auscultation and percussion. RESPIRATORY:  Effort is normal.  Chest wall is nontender. CARDIAC:  Normal S1-S2.  No murmurs, rubs or gallops.  Pulses are +2 and equal, upper and lower extremities. ABDOMEN:  Bowel sounds are normal.  She is obese.  She is not distended. She is quite tender.  She has marked erythema, cellulitis lower abdominal wall incision.  Steri-Strips are still in place. Drainage was coming from the left side.  We planned to open this up. There is no masses, abscess, or hernia at this point.Upper abdomen soft.  GU/RECTAL:  Deferred.   Lymphadenopathy, I could not really palpate it. Femoral areas sitting up.  Cervical and axillary normal. SKIN:  She has some marked cellulitis in lower abdominal wall. NEUROLOGIC:  No focal deficits.  Cranial nerves are intact. PSYCH:  Normal affect.  LABORATORY DATA:  Labs ordered.  DIAGNOSTICS:  Ordered.  IMPRESSION: 1. Status post myomectomy with enterotomy and primary repair on     February 12, 2011. 2. 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 3. Prothrombin to gene mutation, on Coumadin. 4. History of pulmonary embolus in September 2010. 5. History of menorrhagia, dysmenorrhea, fibroids, and polycystic     ovarian disease. 6. Iron deficiency anemia. 7. Possible sleep apnea. 8. Body mass index of 47.8.  PLAN:  Labs have been ordered.  We will make her n.p.o.  We will get CT of her abdomen and pelvis with IV and oral contrast.  PICC line, possible T and A, and IV antibiotics with further treatment as indicated.     Eber Hong, P.A.   ______________________________ Angelia Mould. Derrell Lolling, M.D.    WDJ/MEDQ  D:  02/20/2011  T:  02/20/2011  Job:  [id number]  cc:   Vikki Ports A. Felicity Coyer, [address] [place], [place] [address]  [name], M.D. Fax: [phone]  Samul Dada, M.D. Fax: [phone]  Electronically Signed by Sherrie George P.A. on 03/01/2011 10:31:31 PM Electronically Signed by Claud Kelp M.D. on 03/09/2011 04:10:23 PM

## 2011-03-10 LAB — CBC
Hemoglobin: 8.3 g/dL — ABNORMAL LOW (ref 12.0–15.0)
MCH: 26.8 pg (ref 26.0–34.0)
MCV: 81.9 fL (ref 78.0–100.0)
RBC: 3.1 MIL/uL — ABNORMAL LOW (ref 3.87–5.11)

## 2011-03-10 LAB — GLUCOSE, CAPILLARY
Glucose-Capillary: 121 mg/dL — ABNORMAL HIGH (ref 70–99)
Glucose-Capillary: 121 mg/dL — ABNORMAL HIGH (ref 70–99)

## 2011-03-10 LAB — BASIC METABOLIC PANEL
CO2: 27 mEq/L (ref 19–32)
Calcium: 8.4 mg/dL (ref 8.4–10.5)
Chloride: 102 mEq/L (ref 96–112)
Glucose, Bld: 111 mg/dL — ABNORMAL HIGH (ref 70–99)
Sodium: 137 mEq/L (ref 135–145)

## 2011-03-11 DIAGNOSIS — E46 Unspecified protein-calorie malnutrition: Secondary | ICD-10-CM

## 2011-03-11 LAB — DIFFERENTIAL
Basophils Absolute: 0 10*3/uL (ref 0.0–0.1)
Basophils Relative: 0 % (ref 0–1)
Eosinophils Absolute: 0.2 10*3/uL (ref 0.0–0.7)
Lymphs Abs: 1.6 10*3/uL (ref 0.7–4.0)
Neutrophils Relative %: 61 % (ref 43–77)

## 2011-03-11 LAB — GLUCOSE, CAPILLARY
Glucose-Capillary: 130 mg/dL — ABNORMAL HIGH (ref 70–99)
Glucose-Capillary: 102 mg/dL — ABNORMAL HIGH (ref 70–99)

## 2011-03-11 LAB — COMPREHENSIVE METABOLIC PANEL
ALT: 12 U/L (ref 0–35)
Albumin: 2.4 g/dL — ABNORMAL LOW (ref 3.5–5.2)
Alkaline Phosphatase: 125 U/L — ABNORMAL HIGH (ref 39–117)
Calcium: 8.6 mg/dL (ref 8.4–10.5)
Potassium: 3.4 mEq/L — ABNORMAL LOW (ref 3.5–5.1)
Sodium: 137 mEq/L (ref 135–145)
Total Protein: 6.3 g/dL (ref 6.0–8.3)

## 2011-03-11 LAB — CBC
Platelets: 241 10*3/uL (ref 150–400)
RBC: 3.18 MIL/uL — ABNORMAL LOW (ref 3.87–5.11)
RDW: 13.9 % (ref 11.5–15.5)
WBC: 5.9 10*3/uL (ref 4.0–10.5)

## 2011-03-11 LAB — PHOSPHORUS: Phosphorus: 4.4 mg/dL (ref 2.3–4.6)

## 2011-03-11 LAB — MAGNESIUM: Magnesium: 1.9 mg/dL (ref 1.5–2.5)

## 2011-03-11 LAB — PREALBUMIN: Prealbumin: 15 mg/dL — ABNORMAL LOW (ref 17.0–34.0)

## 2011-03-12 ENCOUNTER — Encounter (HOSPITAL_COMMUNITY): Payer: Self-pay | Admitting: Radiology

## 2011-03-12 ENCOUNTER — Inpatient Hospital Stay (HOSPITAL_COMMUNITY): Payer: PRIVATE HEALTH INSURANCE

## 2011-03-12 LAB — CBC
MCH: 27.6 pg (ref 26.0–34.0)
Platelets: 216 10*3/uL (ref 150–400)
RBC: 3.01 MIL/uL — ABNORMAL LOW (ref 3.87–5.11)
RDW: 14.1 % (ref 11.5–15.5)
WBC: 5.8 10*3/uL (ref 4.0–10.5)

## 2011-03-12 LAB — BASIC METABOLIC PANEL
CO2: 27 mEq/L (ref 19–32)
Calcium: 8.3 mg/dL — ABNORMAL LOW (ref 8.4–10.5)
Chloride: 101 mEq/L (ref 96–112)
Creatinine, Ser: <0.47 mg/dL — ABNORMAL LOW (ref 0.50–1.10)
Sodium: 136 mEq/L (ref 135–145)

## 2011-03-12 LAB — GLUCOSE, CAPILLARY
Glucose-Capillary: 104 mg/dL — ABNORMAL HIGH (ref 70–99)
Glucose-Capillary: 123 mg/dL — ABNORMAL HIGH (ref 70–99)
Glucose-Capillary: 124 mg/dL — ABNORMAL HIGH (ref 70–99)

## 2011-03-12 MED ORDER — IOHEXOL 300 MG/ML  SOLN
100.0000 mL | Freq: Once | INTRAMUSCULAR | Status: AC | PRN
  Administered 2011-03-12: 100 mL via INTRAVENOUS

## 2011-03-13 LAB — GLUCOSE, CAPILLARY
Glucose-Capillary: 128 mg/dL — ABNORMAL HIGH (ref 70–99)
Glucose-Capillary: 108 mg/dL — ABNORMAL HIGH (ref 70–99)

## 2011-03-13 LAB — CBC
MCHC: 32 g/dL (ref 30.0–36.0)
Platelets: 241 10*3/uL (ref 150–400)
RDW: 14.1 % (ref 11.5–15.5)
WBC: 6 10*3/uL (ref 4.0–10.5)

## 2011-03-14 LAB — COMPREHENSIVE METABOLIC PANEL
ALT: 8 U/L (ref 0–35)
AST: 10 U/L (ref 0–37)
CO2: 24 mEq/L (ref 19–32)
Calcium: 8.3 mg/dL — ABNORMAL LOW (ref 8.4–10.5)
Chloride: 101 mEq/L (ref 96–112)
Creatinine, Ser: <0.47 mg/dL — ABNORMAL LOW (ref 0.50–1.10)
Glucose, Bld: 116 mg/dL — ABNORMAL HIGH (ref 70–99)
Total Bilirubin: 0.1 mg/dL — ABNORMAL LOW (ref 0.3–1.2)

## 2011-03-14 LAB — GLUCOSE, CAPILLARY

## 2011-03-14 LAB — PHOSPHORUS: Phosphorus: 4.2 mg/dL (ref 2.3–4.6)

## 2011-03-15 LAB — BASIC METABOLIC PANEL
CO2: 27 mEq/L (ref 19–32)
Chloride: 103 mEq/L (ref 96–112)
Potassium: 3.5 mEq/L (ref 3.5–5.1)

## 2011-03-15 LAB — GLUCOSE, CAPILLARY
Glucose-Capillary: 101 mg/dL — ABNORMAL HIGH (ref 70–99)
Glucose-Capillary: 110 mg/dL — ABNORMAL HIGH (ref 70–99)
Glucose-Capillary: 126 mg/dL — ABNORMAL HIGH (ref 70–99)

## 2011-03-15 LAB — PREALBUMIN: Prealbumin: 5.1 mg/dL — ABNORMAL LOW (ref 17.0–34.0)

## 2011-03-16 LAB — BASIC METABOLIC PANEL
BUN: 13 mg/dL (ref 6–23)
Calcium: 8.3 mg/dL — ABNORMAL LOW (ref 8.4–10.5)
Creatinine, Ser: <0.47 mg/dL — ABNORMAL LOW (ref 0.50–1.10)
Glucose, Bld: 113 mg/dL — ABNORMAL HIGH (ref 70–99)

## 2011-03-17 DIAGNOSIS — K632 Fistula of intestine: Secondary | ICD-10-CM

## 2011-03-17 LAB — CBC
MCH: 26.7 pg (ref 26.0–34.0)
MCHC: 32.4 g/dL (ref 30.0–36.0)
MCV: 82.3 fL (ref 78.0–100.0)
Platelets: 242 10*3/uL (ref 150–400)
RBC: 3.11 MIL/uL — ABNORMAL LOW (ref 3.87–5.11)
RDW: 14.2 % (ref 11.5–15.5)

## 2011-03-17 LAB — BASIC METABOLIC PANEL
CO2: 26 mEq/L (ref 19–32)
Calcium: 8.7 mg/dL (ref 8.4–10.5)
Creatinine, Ser: <0.47 mg/dL — ABNORMAL LOW (ref 0.50–1.10)

## 2011-03-18 LAB — DIFFERENTIAL
Basophils Absolute: 0 10*3/uL (ref 0.0–0.1)
Basophils Relative: 0 % (ref 0–1)
Monocytes Relative: 5 % (ref 3–12)
Neutro Abs: 4.5 10*3/uL (ref 1.7–7.7)
Neutrophils Relative %: 73 % (ref 43–77)

## 2011-03-18 LAB — CBC
HCT: 26.2 % — ABNORMAL LOW (ref 36.0–46.0)
Hemoglobin: 8.6 g/dL — ABNORMAL LOW (ref 12.0–15.0)
MCH: 26.5 pg (ref 26.0–34.0)
MCHC: 32.8 g/dL (ref 30.0–36.0)
MCV: 80.6 fL (ref 78.0–100.0)
Platelets: 241 10*3/uL (ref 150–400)
RBC: 3.25 MIL/uL — ABNORMAL LOW (ref 3.87–5.11)
RDW: 14.1 % (ref 11.5–15.5)
WBC: 6.3 10*3/uL (ref 4.0–10.5)

## 2011-03-18 LAB — TRIGLYCERIDES: Triglycerides: 168 mg/dL — ABNORMAL HIGH (ref <150)

## 2011-03-18 LAB — CHOLESTEROL, TOTAL: Cholesterol: 137 mg/dL (ref 0–200)

## 2011-03-18 LAB — COMPREHENSIVE METABOLIC PANEL
ALT: 10 U/L (ref 0–35)
Albumin: 2.4 g/dL — ABNORMAL LOW (ref 3.5–5.2)
Calcium: 8.9 mg/dL (ref 8.4–10.5)
Glucose, Bld: 116 mg/dL — ABNORMAL HIGH (ref 70–99)
Sodium: 136 mEq/L (ref 135–145)
Total Protein: 6.3 g/dL (ref 6.0–8.3)

## 2011-03-18 LAB — PREALBUMIN: Prealbumin: 16.7 mg/dL — ABNORMAL LOW (ref 17.0–34.0)

## 2011-03-18 LAB — MAGNESIUM: Magnesium: 1.7 mg/dL (ref 1.5–2.5)

## 2011-03-19 LAB — BASIC METABOLIC PANEL
BUN: 16 mg/dL (ref 6–23)
Chloride: 104 mEq/L (ref 96–112)
Creatinine, Ser: <0.47 mg/dL — ABNORMAL LOW (ref 0.50–1.10)
Glucose, Bld: 111 mg/dL — ABNORMAL HIGH (ref 70–99)
Potassium: 3.3 mEq/L — ABNORMAL LOW (ref 3.5–5.1)

## 2011-03-19 NOTE — Op Note (Signed)
[name], [name]               ACCOUNT NO.:  [id number]  MEDICAL RECORD NO.:  [id number]  LOCATION:                                 FACILITY:  PHYSICIAN:  Angelia Mould. Derrell Lolling, M.D.DATE OF BIRTH:  06/01/1976  DATE OF PROCEDURE:  03/17/2011 DATE OF DISCHARGE:                              OPERATIVE REPORT   PREOPERATIVE DIAGNOSIS:  Colocutaneous fistula.  POSTOPERATIVE DIAGNOSIS:  Colocutaneous fistula.  OPERATION PERFORMED:  Abdominal wound exploration, placement of drains.  SURGEON:  Angelia Mould. Derrell Lolling, M.D.  OPERATIVE INDICATION:  This is a 35 year old female with a past history of uterine myomectomy.  She recently underwent elective laparotomy through a Pfannenstiel incision for planned uterine myomectomy.  The operation was complicated by intraoperative bowel injury which was full thickness.  Dr. Maisie Fus Cornett repaired that injury.  The patient initially did well but had to be readmitted to [facility] about 3 weeks ago with stool draining from her wound.  She underwent wound exploration with findings of stool coming out from the upper edge of the fascia to the right of the midline.  Drains were placed.  She was stabilized on antibiotics and hyperalimentation.  Studies have shown that this is a leak from the sigmoid colon.  For the past couple of days, she has been complaining of severe pain at the left lateral aspect of the wound where the drain is sutured in.  She was brought to the operating room for removal of that drain, exploration of the wound and to place a new drain more directly down from the anterior surface of the wound to the fistula in hopes of allowing a mature fistula tract to form. This has been discussed with the patient and her family and all were in agreement. She is currently opposed to a diverting colostomy.  OPERATIVE TECHNIQUE:  Following the induction of general endotracheal anesthesia, the patient's abdomen was examined.  The large  32-French red rubber drain was noted coming out the left lateral aspect of the transverse incision and it was directed posteriorly and medially across the midline down into the tract where the colocutaneous fistula was controlled by the drain.  After prepping and draping this area, we cut the sutures and removed the drain.  We examined the wound.  There was very little stool seen.  We irrigated all of this out.  I brought a new 32-French red rectal tube catheter to the operative field and cut several extra side holes.  I placed this gently down into the tract trying to stay at or slightly above the level of the fascia.  This was brought directly anteriorly out and sutured to the midline of the lower edge of the wound with 2-0 nylon sutures.  The catheter flushed easily. A 3 way stopcock was placed on the end of the catheter.  The rest of the wound was packed with saline and Kerlix gauze.  Patient tolerated the procedure well and was taken to recovery room in stable condition.  ESTIMATED BLOOD LOSS:  Was about 15 to 20 cc.  COMPLICATIONS:  None.  Sponge, needle, and instrument counts were correct.     Angelia Mould. Derrell Lolling, M.D.  HMI/MEDQ  D:  03/17/2011  T:  03/18/2011  Job:  [id number]  cc:   Dineen Kid. Rana Snare, M.D. Fax: [phone]  Clovis Pu. Cornett, M.D. [address] [place] [address]  Electronically Signed by Claud Kelp M.D. on 03/19/2011 09:04:33 PM

## 2011-03-20 LAB — BASIC METABOLIC PANEL
BUN: 15 mg/dL (ref 6–23)
CO2: 24 mEq/L (ref 19–32)
Calcium: 8.9 mg/dL (ref 8.4–10.5)
Chloride: 103 mEq/L (ref 96–112)
Creatinine, Ser: 0.44 mg/dL — ABNORMAL LOW (ref 0.50–1.10)
Glucose, Bld: 121 mg/dL — ABNORMAL HIGH (ref 70–99)

## 2011-03-21 ENCOUNTER — Inpatient Hospital Stay (HOSPITAL_COMMUNITY): Payer: PRIVATE HEALTH INSURANCE

## 2011-03-21 LAB — COMPREHENSIVE METABOLIC PANEL
ALT: 29 U/L (ref 0–35)
BUN: 17 mg/dL (ref 6–23)
CO2: 23 mEq/L (ref 19–32)
Calcium: 8.7 mg/dL (ref 8.4–10.5)
Creatinine, Ser: 0.5 mg/dL (ref 0.50–1.10)
GFR calc Af Amer: >90 mL/min (ref >90)
GFR calc non Af Amer: >90 mL/min (ref >90)
Glucose, Bld: 113 mg/dL — ABNORMAL HIGH (ref 70–99)
Sodium: 133 mEq/L — ABNORMAL LOW (ref 135–145)
Total Protein: 6.2 g/dL (ref 6.0–8.3)

## 2011-03-21 LAB — PHOSPHORUS: Phosphorus: 3.5 mg/dL (ref 2.3–4.6)

## 2011-03-21 LAB — MAGNESIUM: Magnesium: 1.5 mg/dL (ref 1.5–2.5)

## 2011-03-21 MED ORDER — IOHEXOL 300 MG/ML  SOLN
100.0000 mL | Freq: Once | INTRAMUSCULAR | Status: AC | PRN
  Administered 2011-03-21: 100 mL via INTRAVENOUS

## 2011-03-22 LAB — BASIC METABOLIC PANEL
CO2: 24 mEq/L (ref 19–32)
Chloride: 102 mEq/L (ref 96–112)
Creatinine, Ser: 0.5 mg/dL (ref 0.50–1.10)
GFR calc Af Amer: >90 mL/min (ref >90)
Sodium: 135 mEq/L (ref 135–145)

## 2011-03-22 LAB — CBC
MCV: 78.3 fL (ref 78.0–100.0)
Platelets: 258 10*3/uL (ref 150–400)
RBC: 3.36 MIL/uL — ABNORMAL LOW (ref 3.87–5.11)
RDW: 14.1 % (ref 11.5–15.5)
WBC: 6.6 10*3/uL (ref 4.0–10.5)

## 2011-03-25 LAB — DIFFERENTIAL
Basophils Relative: 0 % (ref 0–1)
Eosinophils Absolute: 0.2 10*3/uL (ref 0.0–0.7)
Lymphs Abs: 1.5 10*3/uL (ref 0.7–4.0)
Monocytes Relative: 6 % (ref 3–12)
Neutro Abs: 1.6 10*3/uL — ABNORMAL LOW (ref 1.7–7.7)
Neutrophils Relative %: 45 % (ref 43–77)

## 2011-03-25 LAB — COMPREHENSIVE METABOLIC PANEL
ALT: 14 U/L (ref 0–35)
AST: 11 U/L (ref 0–37)
Alkaline Phosphatase: 99 U/L (ref 39–117)
CO2: 23 mEq/L (ref 19–32)
Chloride: 105 mEq/L (ref 96–112)
GFR calc Af Amer: >90 mL/min (ref >90)
GFR calc non Af Amer: >90 mL/min (ref >90)
Glucose, Bld: 108 mg/dL — ABNORMAL HIGH (ref 70–99)
Sodium: 136 mEq/L (ref 135–145)
Total Bilirubin: 0.1 mg/dL — ABNORMAL LOW (ref 0.3–1.2)

## 2011-03-25 LAB — CBC
Hemoglobin: 8.2 g/dL — ABNORMAL LOW (ref 12.0–15.0)
Platelets: 263 10*3/uL (ref 150–400)
RBC: 3.14 MIL/uL — ABNORMAL LOW (ref 3.87–5.11)
WBC: 3.4 10*3/uL — ABNORMAL LOW (ref 4.0–10.5)

## 2011-03-27 LAB — COMPREHENSIVE METABOLIC PANEL
Albumin: 2.3 g/dL — ABNORMAL LOW (ref 3.5–5.2)
Alkaline Phosphatase: 108 U/L (ref 39–117)
BUN: 12 mg/dL (ref 6–23)
CO2: 24 mEq/L (ref 19–32)
Chloride: 109 mEq/L (ref 96–112)
GFR calc non Af Amer: >90 mL/min (ref >90)
Glucose, Bld: 98 mg/dL (ref 70–99)
Potassium: 3.4 mEq/L — ABNORMAL LOW (ref 3.5–5.1)
Total Bilirubin: 0.1 mg/dL — ABNORMAL LOW (ref 0.3–1.2)

## 2011-03-27 LAB — CBC
HCT: 24.4 % — ABNORMAL LOW (ref 36.0–46.0)
Hemoglobin: 8.1 g/dL — ABNORMAL LOW (ref 12.0–15.0)
MCV: 78.2 fL (ref 78.0–100.0)
Platelets: 265 10*3/uL (ref 150–400)
RBC: 3.12 MIL/uL — ABNORMAL LOW (ref 3.87–5.11)
WBC: 4 10*3/uL (ref 4.0–10.5)

## 2011-03-27 LAB — MAGNESIUM: Magnesium: 1.4 mg/dL — ABNORMAL LOW (ref 1.5–2.5)

## 2011-03-27 LAB — GLUCOSE, CAPILLARY: Glucose-Capillary: 119 mg/dL — ABNORMAL HIGH (ref 70–99)

## 2011-03-28 LAB — COMPREHENSIVE METABOLIC PANEL
ALT: 21 U/L (ref 0–35)
AST: 16 U/L (ref 0–37)
Albumin: 2.2 g/dL — ABNORMAL LOW (ref 3.5–5.2)
CO2: 25 mEq/L (ref 19–32)
Calcium: 8.6 mg/dL (ref 8.4–10.5)
GFR calc non Af Amer: >90 mL/min (ref >90)
Sodium: 140 mEq/L (ref 135–145)
Total Protein: 5.7 g/dL — ABNORMAL LOW (ref 6.0–8.3)

## 2011-03-28 LAB — CBC
MCH: 25.7 pg — ABNORMAL LOW (ref 26.0–34.0)
Platelets: 314 10*3/uL (ref 150–400)
RBC: 3.19 MIL/uL — ABNORMAL LOW (ref 3.87–5.11)

## 2011-03-29 LAB — CBC
HCT: 23.1 % — ABNORMAL LOW (ref 36.0–46.0)
Hemoglobin: 7.5 g/dL — ABNORMAL LOW (ref 12.0–15.0)
MCV: 79.4 fL (ref 78.0–100.0)
RBC: 2.91 MIL/uL — ABNORMAL LOW (ref 3.87–5.11)
WBC: 5.7 10*3/uL (ref 4.0–10.5)

## 2011-03-29 LAB — BASIC METABOLIC PANEL
BUN: 14 mg/dL (ref 6–23)
CO2: 26 mEq/L (ref 19–32)
Chloride: 108 mEq/L (ref 96–112)
Creatinine, Ser: 0.5 mg/dL (ref 0.50–1.10)
Glucose, Bld: 103 mg/dL — ABNORMAL HIGH (ref 70–99)

## 2011-03-30 LAB — BASIC METABOLIC PANEL
BUN: 14 mg/dL (ref 6–23)
Chloride: 104 mEq/L (ref 96–112)
GFR calc Af Amer: >90 mL/min (ref >90)
Glucose, Bld: 103 mg/dL — ABNORMAL HIGH (ref 70–99)
Potassium: 3.6 mEq/L (ref 3.5–5.1)
Sodium: 139 mEq/L (ref 135–145)

## 2011-03-30 NOTE — Op Note (Signed)
[name], [name]               ACCOUNT NO.:  [id number]  MEDICAL RECORD NO.:  [id number]  LOCATION:                                 FACILITY:  PHYSICIAN:  [name], MDDATE OF BIRTH:  September 03, 1975  DATE OF PROCEDURE:  03/27/2011 DATE OF DISCHARGE:                              OPERATIVE REPORT   PREOPERATIVE DIAGNOSIS:  Colocutaneous fistula.  POSTOPERATIVE DIAGNOSIS:  Colocutaneous fistula.  PROCEDURE:  Laparoscopic-assisted sigmoid colostomy.  SURGEON:  Alicia Gosling, MD  ASSISTANT:  [name], M.D.  ANESTHESIA:  General endotracheal.  ANESTHESIOLOGIST SUPERVISING:  Alicia Bump. Okey [name], M.D.  COMPLICATIONS:  None.  DRAINS:  None.  DISPOSITION:  To recovery room in stable condition.  INDICATIONS:  A 35 year old female with a history of a uterine myomectomy that was complicated by an intraoperative bowel injury.  This was repaired at that time, but a week later, she came back with interior drainage from her wound.  This was noted to be a colocutaneous fistula. This is attempted conservative treatment, but is not healing, and she has had increased output from her stoma. We discussed multiple options including continue with conservative treatment versus a proximal diversion to see if we could get this area to heal.  After a long conversation after discussion with Dr. Derrell Lolling, her prior surgeon, we elected to perform a colostomy.  I discussed a laparoscopic-assisted colostomy.  I had her marked for a transverse colostomy, but I was planning on viewing her abdomen as it would be the easiest area to give her a stoma.  PROCEDURE IN DETAIL:  After informed consent was obtained, the patient was done through a heparin window due to her hypercoagulable state.  She had 1 g of intravenous ertapenem administered.  Sequential compression devices placed on her lower extremities prior to induction with anesthesia.  She was then placed under general  endotracheal anesthesia without complication.  Foley catheter was placed.  An orogastric tube was also placed.  She was then prepped and draped in a standard sterile surgical fashion.  Betadine were used because of a supposed ChloraPrep allergy.  A surgical timeout was then performed.  I accessed her abdomen in the left upper quadrant after infiltration with 0.25% Marcaine using the Optiview technique and a 5 mm camera.  She was then insufflated with 15 mmHg pressure.  There was no evidence of an entry injury.  I then viewed remainder of her abdomen.  She had adhesions at the midline and down where colocutaneous fistula was.  I did place an additional 3 trocars on the left side under direct vision without complication.  I then took down some of the midline adhesions bluntly.  I did not enter into the area where it appeared that the fistula was as well as there were some adherent bowel. I looked at her transverse colon as well as her sigmoid colon, it looked her sigmoid colon proximal to the fistula looked very free, and due to the fact that I think this would be easier to manage over the long-term, I elected to do a sigmoid colostomy.  I then took the cautery and I freed up the white line of Toldt all  the way up to the splenic flexure, I did not mobilize the splenic flexure and mobilized the sigmoid to the midline. We picked a spot in the rectus muscle right below the umbilicus where it appeared she would be able to have a good colostomy, and then grasped this with a Babcock.  I then went to the outside till a Kocher clamp grasped her skin.  I cored out this area.  I made a cruciate incision in the fascia , then I took this into the peritoneum.  I then brought the stoma up.  We viewed this with a laparoscope and it was in good position.  I then brought this up.  I placed a red rubber catheter below this of 14-French size.  I then matured the colostomy with 3-0 Vicryl suture and attached  the bar with 3-0 nylon suture.  I then viewed this laparoscopically again, everything appeared to fit well.  I then removed the only laparoscopic equipment.  The stoma bag was placed over the stoma.  I closed the incisions with 4-0 Monocryl and Dermabond.  She tolerated this well.  I also placed a 2-0 nylon stitch in the drain at her fistula site, and we did a dressing change as well.  She was transferred to recovery in stable condition.     Alicia Gosling, MD     MCW/MEDQ  D:  03/27/2011  T:  03/27/2011  Job:  [id number]  Electronically Signed by Emelia Loron MD on 03/30/2011 11:39:46 AM

## 2011-03-31 LAB — PHOSPHORUS: Phosphorus: 4.1 mg/dL (ref 2.3–4.6)

## 2011-04-01 LAB — COMPREHENSIVE METABOLIC PANEL
ALT: 35 U/L (ref 0–35)
AST: 17 U/L (ref 0–37)
Alkaline Phosphatase: 143 U/L — ABNORMAL HIGH (ref 39–117)
CO2: 27 mEq/L (ref 19–32)
Calcium: 9 mg/dL (ref 8.4–10.5)
Chloride: 105 mEq/L (ref 96–112)
GFR calc Af Amer: >90 mL/min (ref >90)
GFR calc non Af Amer: >90 mL/min (ref >90)
Glucose, Bld: 109 mg/dL — ABNORMAL HIGH (ref 70–99)
Sodium: 140 mEq/L (ref 135–145)
Total Bilirubin: 0.2 mg/dL — ABNORMAL LOW (ref 0.3–1.2)

## 2011-04-01 LAB — CBC
Hemoglobin: 8.3 g/dL — ABNORMAL LOW (ref 12.0–15.0)
MCH: 25.1 pg — ABNORMAL LOW (ref 26.0–34.0)
Platelets: 258 10*3/uL (ref 150–400)
RBC: 3.31 MIL/uL — ABNORMAL LOW (ref 3.87–5.11)
WBC: 4.5 10*3/uL (ref 4.0–10.5)

## 2011-04-01 LAB — CHOLESTEROL, TOTAL: Cholesterol: 135 mg/dL (ref 0–200)

## 2011-04-01 LAB — DIFFERENTIAL
Basophils Relative: 0 % (ref 0–1)
Lymphs Abs: 1.7 10*3/uL (ref 0.7–4.0)
Monocytes Relative: 4 % (ref 3–12)
Neutro Abs: 2.4 10*3/uL (ref 1.7–7.7)
Neutrophils Relative %: 53 % (ref 43–77)

## 2011-04-02 DIAGNOSIS — F4320 Adjustment disorder, unspecified: Secondary | ICD-10-CM

## 2011-04-02 LAB — PROTIME-INR
INR: 1.05 (ref 0.00–1.49)
Prothrombin Time: 13.9 seconds (ref 11.6–15.2)

## 2011-04-03 LAB — BASIC METABOLIC PANEL
Calcium: 8.9 mg/dL (ref 8.4–10.5)
GFR calc Af Amer: >90 mL/min (ref >90)
GFR calc non Af Amer: >90 mL/min (ref >90)
Glucose, Bld: 109 mg/dL — ABNORMAL HIGH (ref 70–99)
Potassium: 3.4 mEq/L — ABNORMAL LOW (ref 3.5–5.1)
Sodium: 140 mEq/L (ref 135–145)

## 2011-04-03 LAB — PROTIME-INR
INR: 1.11 (ref 0.00–1.49)
Prothrombin Time: 14.5 seconds (ref 11.6–15.2)

## 2011-04-04 LAB — MAGNESIUM: Magnesium: 1.5 mg/dL (ref 1.5–2.5)

## 2011-04-04 LAB — COMPREHENSIVE METABOLIC PANEL
ALT: 17 U/L (ref 0–35)
AST: 9 U/L (ref 0–37)
Albumin: 2.5 g/dL — ABNORMAL LOW (ref 3.5–5.2)
Alkaline Phosphatase: 115 U/L (ref 39–117)
BUN: 10 mg/dL (ref 6–23)
CO2: 25 mEq/L (ref 19–32)
Calcium: 8.6 mg/dL (ref 8.4–10.5)
Chloride: 103 mEq/L (ref 96–112)
Creatinine, Ser: 0.58 mg/dL (ref 0.50–1.10)
GFR calc Af Amer: >90 mL/min (ref >90)
GFR calc non Af Amer: >90 mL/min (ref >90)
Glucose, Bld: 104 mg/dL — ABNORMAL HIGH (ref 70–99)
Potassium: 3.4 mEq/L — ABNORMAL LOW (ref 3.5–5.1)
Sodium: 138 mEq/L (ref 135–145)
Total Bilirubin: 0.1 mg/dL — ABNORMAL LOW (ref 0.3–1.2)
Total Protein: 6 g/dL (ref 6.0–8.3)

## 2011-04-04 LAB — CBC
HCT: 26.4 % — ABNORMAL LOW (ref 36.0–46.0)
Hemoglobin: 8.5 g/dL — ABNORMAL LOW (ref 12.0–15.0)
MCH: 25.8 pg — ABNORMAL LOW (ref 26.0–34.0)
MCHC: 32.2 g/dL (ref 30.0–36.0)
MCV: 80.2 fL (ref 78.0–100.0)
Platelets: 228 10*3/uL (ref 150–400)
RBC: 3.29 MIL/uL — ABNORMAL LOW (ref 3.87–5.11)
RDW: 14.7 % (ref 11.5–15.5)
WBC: 6.5 10*3/uL (ref 4.0–10.5)

## 2011-04-04 LAB — PROTIME-INR
INR: 1.37 (ref 0.00–1.49)
Prothrombin Time: 17.1 seconds — ABNORMAL HIGH (ref 11.6–15.2)

## 2011-04-04 NOTE — Consult Note (Signed)
[name], [name]               ACCOUNT NO.:  [id number]  MEDICAL RECORD NO.:  [id number]  LOCATION:                               FACILITY:  [facility]  PHYSICIAN:  Eulogio Ditch, MD DATE OF BIRTH:  Jan 11, 1976  DATE OF CONSULTATION:  04/02/2011 DATE OF DISCHARGE:                                CONSULTATION   REASON FOR CONSULT:  Anxiety/depression.  HISTORY OF PRESENT ILLNESS:  A 35 year old Caucasian female who is in the hospital for 6 weeks.  The patient initially had surgery for fibroid and then told me that her intestines were cut by mistake and then she has to go through different surgeries.  PAST PSYCH HISTORY:  The patient denies any history of admission in Psychiatry or seen by a psychiatrist or on any medications for anxiety by the primary care physician.  PAST MEDICAL HISTORY:  History of obstructive sleep apnea, questionable secondary to gluten allergy.  She is not on CPAP.  Iron-deficiency anemia,  obesity, and history of polycystic ovarian disease.  PAST SURGICAL HISTORY:  Myomectomy, fibroid surgery in 2004.  SUBSTANCE ABUSE HISTORY:  Denies abuse of any illicit drugs or alcohol.  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  She is engaged.  MENTAL STATUS EXAMINATION:  The patient is calm, cooperative during the interview.  Fair eye contact.  Speech normal in rate, rhythm, and volume.  Mood, anxious.  Affect, mood congruent.  The patient reported that she has panic attacks and last attack was yesterday at night.  The patient reported that she never had anxiety before coming to the hospital.  The mom told me that most likely it is because she is in the hospital and she is feeling that she cannot ever come out of the hospital, that is taking a toll on her, and that is why she is anxious and depressed.  But the patient denies any suicidal or homicidal ideation.  She is not hallucinating or delusional.  She is very  calm, cooperative, pleasant.  Cognition alert, awake, oriented times 3. Memory, immediate, recent, remote fair.  Attention, concentration, fair. Abstraction ability, fair.  Insight and judgment fair.  DIAGNOSES:  Axis I:  Adjustment disorder, mixed type. Axis II:  Deferred. Axis III:  See medical notes. Axis IV:  Long stay in the hospital. Axis V:  55.  RECOMMENDATIONS: 1. The patient can be started on Klonopin 0.5 mg at bedtime.  Side     effects, risks, and benefits of the medication discussed with the     patient and the mom. 2. I also discussed with them that she might get benefit by counseling     from the outpatient setting after discharge from the hospital, but     mom told me that she has a very strong girl, once she is out of the     hospital, she do not     think so she need that.  Psychoeducation given to the patient     regarding the anxiety and coping skills discussed with the patient.     I will follow up on this patient as needed. Thanks for involving me in  taking care of this patient.     Eulogio Ditch, MD     SA/MEDQ  D:  04/02/2011  T:  04/02/2011  Job:  [id number]  Electronically Signed by Eulogio Ditch  on 04/04/2011 12:12:46 PM

## 2011-04-05 ENCOUNTER — Telehealth: Payer: Self-pay

## 2011-04-05 LAB — PROTIME-INR
INR: 1.96 — ABNORMAL HIGH (ref 0.00–1.49)
Prothrombin Time: 22.7 s — ABNORMAL HIGH (ref 11.6–15.2)

## 2011-04-05 NOTE — Telephone Encounter (Signed)
Pt telephoned to inform us that she was discharged home today from hospital. She started on Tuesday with her coumadin dose. She received 10mg   On Tues, Wed, and Thurs and 5mg s today at discharge plus Lovenox injection while in hospital. Her discharge instructions were to take 7.5mg s QD until recheck INR,  appt s/c for Tuesday.

## 2011-04-09 ENCOUNTER — Ambulatory Visit (INDEPENDENT_AMBULATORY_CARE_PROVIDER_SITE_OTHER): Payer: PRIVATE HEALTH INSURANCE | Admitting: General Surgery

## 2011-04-09 ENCOUNTER — Telehealth (INDEPENDENT_AMBULATORY_CARE_PROVIDER_SITE_OTHER): Payer: Self-pay | Admitting: General Surgery

## 2011-04-09 ENCOUNTER — Ambulatory Visit (INDEPENDENT_AMBULATORY_CARE_PROVIDER_SITE_OTHER): Payer: PRIVATE HEALTH INSURANCE | Admitting: *Deleted

## 2011-04-09 ENCOUNTER — Encounter (INDEPENDENT_AMBULATORY_CARE_PROVIDER_SITE_OTHER): Payer: Self-pay | Admitting: General Surgery

## 2011-04-09 VITALS — BP 132/94 | HR 104 | Temp 97.4°F | Resp 20 | Ht <= 58 in | Wt 219.4 lb

## 2011-04-09 DIAGNOSIS — Z7901 Long term (current) use of anticoagulants: Secondary | ICD-10-CM

## 2011-04-09 DIAGNOSIS — I2699 Other pulmonary embolism without acute cor pulmonale: Secondary | ICD-10-CM

## 2011-04-09 DIAGNOSIS — K632 Fistula of intestine: Secondary | ICD-10-CM

## 2011-04-09 NOTE — Telephone Encounter (Signed)
Needs 1week reck per Dr. Donell Beers, pt seen in urgent office on 04/09/11, please call.

## 2011-04-09 NOTE — Progress Notes (Signed)
I can't close as one

## 2011-04-09 NOTE — Patient Instructions (Signed)
Ok to pull bar if falls out again.

## 2011-04-09 NOTE — Assessment & Plan Note (Addendum)
Resecured ostomy bar to prior stitch.   Pt has follow up next week. Continue ostomy care.

## 2011-04-09 NOTE — Progress Notes (Signed)
HISTORY: We were contacted about bar falling into ostomy wound.  Essentially, she has a red rubber catheter ostomy bar that pulled through one side of ostomy.  She is otherwise doing well.  Her stool consistency has improved.    EXAM: Head: Normocephalic and atraumatic.  Eyes:  Conjunctivae are normal. Pupils are equal, round, and reactive to light. No scleral icterus.  Face is flushed.   Resp: No respiratory distress, normal effort. Abd:  Abdomen is soft, non distended and non tender. No masses are palpable.  There is no rebound and no guarding. Ostomy is patent.  Red rubber bar has pulled out on one side.   Neurological: Alert and oriented to person, place, and time. Coordination normal.  Skin: Skin is warm and dry. No rash noted. No diaphoretic. No erythema. No pallor.  Psychiatric: Normal mood and affect. Normal behavior. Judgment and thought content normal.     ASSESSMENT AND PLAN:   Colocutaneous fistula, s/p diverting ostomy Resecured ostomy bar to prior stitch.   Pt has follow up next week. Continue ostomy care.          Maudry Diego, MD Surgical Oncology, General & Endocrine Surgery [facility]  Rene Paci, MD, MD Rene Paci, MD

## 2011-04-10 NOTE — Patient Instructions (Signed)
Spoke with pt.  INR from lab was 3.17.  She held last night's dose.  Instructed to start back taking 7.5mg  daily and will recheck INR in 3 weeks.

## 2011-04-12 ENCOUNTER — Telehealth (INDEPENDENT_AMBULATORY_CARE_PROVIDER_SITE_OTHER): Payer: Self-pay | Admitting: General Surgery

## 2011-04-12 ENCOUNTER — Other Ambulatory Visit (INDEPENDENT_AMBULATORY_CARE_PROVIDER_SITE_OTHER): Payer: Self-pay

## 2011-04-12 DIAGNOSIS — G8918 Other acute postprocedural pain: Secondary | ICD-10-CM

## 2011-04-12 MED ORDER — HYDROCODONE-ACETAMINOPHEN 5-325 MG PO TABS: 1.0000 | ORAL_TABLET | ORAL | Status: DC | PRN

## 2011-04-12 NOTE — Telephone Encounter (Signed)
Pt called in asking if she can have additional pain medication issued. Pt stated she has open wound, and colostomy. Stated she had multiple surgeries done, colostomy was 03/27/11. Pt confirmed she saw Dr. Donell Beers on 04/09/11. Pt stated she was given hydrocodone 5-325 protocol. She stated she can be called back on cell [phone].  According to schedule Dr. Donell Beers is out of the office on vacation. The request will be forwarded to urgent office doctor assigned today.

## 2011-04-12 NOTE — Telephone Encounter (Signed)
Called [name] to notify her that I did speak with Dr Dwain Sarna about the appt she needed next wk and Dr Dwain Sarna spoke to Dr Luisa Hart who agreed he will be the doctor taking care of the [name]. The [name] understands./ [facility]

## 2011-04-19 ENCOUNTER — Ambulatory Visit (INDEPENDENT_AMBULATORY_CARE_PROVIDER_SITE_OTHER): Payer: PRIVATE HEALTH INSURANCE | Admitting: Surgery

## 2011-04-19 ENCOUNTER — Encounter (INDEPENDENT_AMBULATORY_CARE_PROVIDER_SITE_OTHER): Payer: Self-pay | Admitting: Surgery

## 2011-04-19 VITALS — BP 142/88 | HR 68 | Temp 97.6°F | Resp 16 | Ht <= 58 in | Wt 223.0 lb

## 2011-04-19 DIAGNOSIS — Z9889 Other specified postprocedural states: Secondary | ICD-10-CM

## 2011-04-19 MED ORDER — HYDROCODONE-ACETAMINOPHEN 5-500 MG PO TABS: 1.0000 | ORAL_TABLET | ORAL | Status: DC | PRN

## 2011-04-19 NOTE — Progress Notes (Signed)
The patient presents today for a postop check. She was initially seen in September 2012 at [facility]. She is undergoing a resection of a uterine fibroid. During the procedure she had injury to her bowel. I was called intraoperatively to see this problem and it was repaired. She had significant scarring in her pelvis and significant distortion of her anatomy. Initially, it was felt this was small bowel. One week afterwards, she developed a fistula and was readmitted to [facility] on hospital. Contrast study showed this to be a redundant sigmoid colon. She was placed on bowel rest and T&A. She continued to have a persistent fistula and a loop diverting colostomy performed by Dr. Dwain Sarna about 3 weeks ago. She was discharged home about 10 days ago and is doing well. Her wound is closing down at home health.  Exam: Lower abdominal wound clean dry and intact without drainage. Left upper quadrant loop colostomy pink viable with mild retraction. The catheter serving as a buttress still in place but retracted slightly. No signs of infection.  Impression: Status post loop diverting colostomy secondary to sigmoid colon injury during gynecologic procedure with breakdown of primary repair.  Plan: She is doing remarkably well. 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 I will see her back in 2 weeks. At that point I will take out the buttress under her colostomy. She will need a CT scan next month in followup with rectal contrast.

## 2011-04-19 NOTE — Patient Instructions (Signed)
Follow-up in 2 weeks

## 2011-04-29 ENCOUNTER — Telehealth (INDEPENDENT_AMBULATORY_CARE_PROVIDER_SITE_OTHER): Payer: Self-pay | Admitting: General Surgery

## 2011-04-29 NOTE — Telephone Encounter (Signed)
The patient is requesting a refill on vicodin 5-500. She states her pain is still pretty bad

## 2011-04-29 NOTE — Telephone Encounter (Signed)
sure

## 2011-04-30 ENCOUNTER — Ambulatory Visit (INDEPENDENT_AMBULATORY_CARE_PROVIDER_SITE_OTHER): Payer: PRIVATE HEALTH INSURANCE | Admitting: *Deleted

## 2011-04-30 ENCOUNTER — Telehealth (INDEPENDENT_AMBULATORY_CARE_PROVIDER_SITE_OTHER): Payer: Self-pay | Admitting: General Surgery

## 2011-04-30 DIAGNOSIS — I2699 Other pulmonary embolism without acute cor pulmonale: Secondary | ICD-10-CM

## 2011-04-30 DIAGNOSIS — Z7901 Long term (current) use of anticoagulants: Secondary | ICD-10-CM

## 2011-04-30 NOTE — Telephone Encounter (Signed)
FAXED REQ RECEIVED FROM [facility] RE REFILL FOR VICODIN 5-500MG /  REFILL [facility]. T. CORNETT/[facility] NOTIFIED OF REFILL/ [id number]. GY

## 2011-05-06 ENCOUNTER — Ambulatory Visit (INDEPENDENT_AMBULATORY_CARE_PROVIDER_SITE_OTHER): Payer: PRIVATE HEALTH INSURANCE | Admitting: Surgery

## 2011-05-06 ENCOUNTER — Encounter (INDEPENDENT_AMBULATORY_CARE_PROVIDER_SITE_OTHER): Payer: Self-pay | Admitting: Surgery

## 2011-05-06 VITALS — BP 126/84 | HR 60 | Temp 97.8°F | Resp 16 | Ht <= 58 in | Wt 216.8 lb

## 2011-05-06 DIAGNOSIS — K632 Fistula of intestine: Secondary | ICD-10-CM

## 2011-05-06 MED ORDER — HYDROMORPHONE HCL 2 MG PO TABS: 2.0000 mg | ORAL_TABLET | ORAL | Status: DC | PRN

## 2011-05-06 NOTE — Patient Instructions (Signed)
Will change pain meds today.  Will arrange for CT scan.

## 2011-05-06 NOTE — Progress Notes (Signed)
The patient presents today for a postop check. She was initially seen in September 2012 at [facility]. She is undergoing a resection of a uterine fibroid. During the procedure she had injury to her bowel. I was called intraoperatively to see this problem and it was repaired. She had significant scarring in her pelvis and significant distortion of her anatomy. Initially, it was felt this was small bowel. One week afterwards, she developed a fistula and was readmitted to [facility] on hospital. Contrast study showed this to be a redundant sigmoid colon. She was placed on bowel rest and T&A. She continued to have a persistent fistula and a loop diverting colostomy performed by Dr. Dwain Sarna about 6 weeks ago.  She is doing ok. Exam: Lower abdominal wound clean dry and intact without drainage. Left upper quadrant loop colostomy pink viable with mild retraction. The catheter serving as a buttress still in place but retracted slightly. No signs of infection. Buttress removed today.  Impression: Status post loop diverting colostomy secondary to sigmoid colon injury during gynecologic procedure with breakdown of primary repair.  Plan: She is doing remarkably well. 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 I will see her back in 2 weeks. CT scanp with rectal contrast. Dilaudid for pain.

## 2011-05-09 ENCOUNTER — Other Ambulatory Visit (INDEPENDENT_AMBULATORY_CARE_PROVIDER_SITE_OTHER): Payer: Self-pay | Admitting: Surgery

## 2011-05-09 ENCOUNTER — Ambulatory Visit
Admission: RE | Admit: 2011-05-09 | Discharge: 2011-05-09 | Disposition: A | Discharge status: Home / Self Care | Payer: PRIVATE HEALTH INSURANCE | Source: Ambulatory Visit | Attending: Surgery | Admitting: Surgery

## 2011-05-09 DIAGNOSIS — K632 Fistula of intestine: Secondary | ICD-10-CM

## 2011-05-13 ENCOUNTER — Telehealth (INDEPENDENT_AMBULATORY_CARE_PROVIDER_SITE_OTHER): Payer: Self-pay

## 2011-05-13 NOTE — Telephone Encounter (Signed)
Pt calling in for a refill for Dilaudid. The pt's pharmacy is [facility] [address] [phone]. Dr Luisa Hart is pm off after call and the urgent office doctor is not coming to Urgent office.[name]

## 2011-05-14 ENCOUNTER — Ambulatory Visit (INDEPENDENT_AMBULATORY_CARE_PROVIDER_SITE_OTHER): Payer: PRIVATE HEALTH INSURANCE | Admitting: *Deleted

## 2011-05-14 DIAGNOSIS — I2699 Other pulmonary embolism without acute cor pulmonale: Secondary | ICD-10-CM

## 2011-05-14 DIAGNOSIS — Z7901 Long term (current) use of anticoagulants: Secondary | ICD-10-CM

## 2011-05-14 NOTE — Telephone Encounter (Signed)
DR. Luisa Hart WROTE RX FOR DILAUDID 2MG  AND TO BE PICKED-UP AT FRONT DESK. PT AWARE. GY

## 2011-05-21 ENCOUNTER — Encounter: Payer: PRIVATE HEALTH INSURANCE | Admitting: *Deleted

## 2011-05-23 ENCOUNTER — Ambulatory Visit (INDEPENDENT_AMBULATORY_CARE_PROVIDER_SITE_OTHER): Payer: PRIVATE HEALTH INSURANCE | Admitting: Surgery

## 2011-05-23 ENCOUNTER — Encounter (INDEPENDENT_AMBULATORY_CARE_PROVIDER_SITE_OTHER): Payer: Self-pay | Admitting: Surgery

## 2011-05-23 VITALS — BP 124/78 | HR 68 | Temp 97.8°F | Resp 16 | Ht <= 58 in | Wt 214.2 lb

## 2011-05-23 DIAGNOSIS — Z9889 Other specified postprocedural states: Secondary | ICD-10-CM

## 2011-05-23 MED ORDER — HYDROMORPHONE HCL 2 MG PO TABS: 2.0000 mg | ORAL_TABLET | Freq: Four times a day (QID) | ORAL | Status: DC | PRN

## 2011-05-23 MED ORDER — FLUCONAZOLE 100 MG PO TABS: 200.0000 mg | ORAL_TABLET | Freq: Every day | ORAL | Status: AC

## 2011-05-23 NOTE — Patient Instructions (Addendum)
Follow up 1 month.  Keep area dry.

## 2011-05-23 NOTE — Progress Notes (Signed)
The patient presents today for a postop check. She was initially seen in September 2012 at [facility]. She is undergoing a resection of a uterine fibroid. During the procedure she had injury to her bowel. I was called intraoperatively to see this problem and it was repaired. She had significant scarring in her pelvis and significant distortion of her anatomy. Initially, it was felt this was small bowel. One week afterwards, she developed a fistula and was readmitted to [facility] on hospital. Contrast study showed this to be a redundant sigmoid colon. She was placed on bowel rest and T&A. She continued to have a persistent fistula and a loop diverting colostomy performed by Dr. Dwain Sarna about 6 weeks ago.  She is doing ok. Exam: Lower abdominal wound clean dry and intact without drainage. Left upper quadrant loop colostomy pink viable with mild retraction. Small yeast infection left lower abdomen. CT:  Decreased inflammation in pelvis.  Small colocutaneous  fistula present. Impression: Status post loop diverting colostomy secondary to sigmoid colon injury during gynecologic procedure with breakdown of primary repair.  Plan: She is doing remarkably well. 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 I will see her back in  weeks.  Will get barium enema next month.  Diflucan for yeast.

## 2011-05-24 ENCOUNTER — Ambulatory Visit (INDEPENDENT_AMBULATORY_CARE_PROVIDER_SITE_OTHER): Payer: PRIVATE HEALTH INSURANCE | Admitting: *Deleted

## 2011-05-24 DIAGNOSIS — Z7901 Long term (current) use of anticoagulants: Secondary | ICD-10-CM

## 2011-05-24 DIAGNOSIS — I2699 Other pulmonary embolism without acute cor pulmonale: Secondary | ICD-10-CM

## 2011-06-07 ENCOUNTER — Other Ambulatory Visit (INDEPENDENT_AMBULATORY_CARE_PROVIDER_SITE_OTHER): Payer: Self-pay | Admitting: Surgery

## 2011-06-07 DIAGNOSIS — Z9889 Other specified postprocedural states: Secondary | ICD-10-CM

## 2011-06-07 MED ORDER — HYDROCODONE-ACETAMINOPHEN 5-325 MG PO TABS: 1.0000 | ORAL_TABLET | ORAL | Status: DC | PRN

## 2011-06-10 ENCOUNTER — Telehealth (INDEPENDENT_AMBULATORY_CARE_PROVIDER_SITE_OTHER): Payer: Self-pay

## 2011-06-10 NOTE — Telephone Encounter (Signed)
Patient called and said her prescription of hydrocodone that was given to her Friday is not working at all and wants to know if she can have Dilaudid again because that seemed to work. Please advise.Marland KitchenMarland Kitchen

## 2011-06-10 NOTE — Telephone Encounter (Signed)
Sure. Script done.

## 2011-06-14 ENCOUNTER — Ambulatory Visit (INDEPENDENT_AMBULATORY_CARE_PROVIDER_SITE_OTHER): Payer: PRIVATE HEALTH INSURANCE | Admitting: *Deleted

## 2011-06-14 DIAGNOSIS — I2699 Other pulmonary embolism without acute cor pulmonale: Secondary | ICD-10-CM

## 2011-06-14 DIAGNOSIS — Z7901 Long term (current) use of anticoagulants: Secondary | ICD-10-CM

## 2011-06-20 ENCOUNTER — Telehealth (INDEPENDENT_AMBULATORY_CARE_PROVIDER_SITE_OTHER): Payer: Self-pay

## 2011-06-20 ENCOUNTER — Telehealth (INDEPENDENT_AMBULATORY_CARE_PROVIDER_SITE_OTHER): Payer: Self-pay | Admitting: General Surgery

## 2011-06-20 NOTE — Telephone Encounter (Signed)
I CALLED MS Mallen TO INFORM HER THAT DR. TOTH WROTE A RX FOR DILAUDID 2MG  #30/ AT FRONT DESK FOR PICK-UP/DR. TOTH WROTE RX AT REQUEST OF DR. CORNETT/GY

## 2011-06-20 NOTE — Telephone Encounter (Signed)
C/O pain at lower incision site on abdomen- pain level 6 - constant- wants Dilaudid- no problem with BM's--OK per Dr. Luisa Hart  Dilaudid 2mg  #30.

## 2011-06-21 ENCOUNTER — Encounter (INDEPENDENT_AMBULATORY_CARE_PROVIDER_SITE_OTHER): Payer: Self-pay | Admitting: Surgery

## 2011-06-21 ENCOUNTER — Ambulatory Visit (INDEPENDENT_AMBULATORY_CARE_PROVIDER_SITE_OTHER): Payer: PRIVATE HEALTH INSURANCE | Admitting: Surgery

## 2011-06-21 VITALS — BP 110/76 | HR 60 | Temp 97.6°F | Resp 12 | Ht <= 58 in | Wt 214.6 lb

## 2011-06-21 DIAGNOSIS — G8918 Other acute postprocedural pain: Secondary | ICD-10-CM

## 2011-06-21 MED ORDER — TRAMADOL HCL 50 MG PO TABS: 50.0000 mg | ORAL_TABLET | Freq: Four times a day (QID) | ORAL | Status: DC | PRN

## 2011-06-21 NOTE — Progress Notes (Signed)
The patient presents today for a postop check. She was initially seen in September 2012 at [facility]. She is undergoing a resection of a uterine fibroid. During the procedure she had injury to her bowel. I was called intraoperatively to see this problem and it was repaired. She had significant scarring in her pelvis and significant distortion of her anatomy. Initially, it was felt this was small bowel. One week afterwards, she developed a fistula and was readmitted to [facility] on hospital. Contrast study showed this to be a redundant sigmoid colon. She was placed on bowel rest and T&A. She continued to have a persistent fistula and a loop diverting colostomy performed by Dr. Dwain Sarna about 10 weeks ago.  She is doing ok. Exam: Lower abdominal wound clean dry and intact without drainage. Left upper quadrant loop colostomy pink viable with mild retraction.  CT:  Decreased inflammation in pelvis.  Small colocutaneous  fistula present. Impression: Status post loop diverting colostomy secondary to sigmoid colon injury during gynecologic procedure with breakdown of primary repair.  Plan: She is doing remarkably well. 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 I will see her back in 2-3 weeks. Will get CT with rectal contrast..  Last CT showed persistent fistula.Return after to discuss next step.  Will add ultram for better pain control and attempt to wean narcotics.

## 2011-06-21 NOTE — Patient Instructions (Signed)
You will be scheduled for a CT.  Add ultram 50 mg every  6 hours as needed for pain .  Reduce dilaudid if you can.

## 2011-06-25 ENCOUNTER — Other Ambulatory Visit: Payer: Self-pay | Admitting: *Deleted

## 2011-06-25 ENCOUNTER — Other Ambulatory Visit: Payer: Self-pay

## 2011-06-25 MED ORDER — WARFARIN SODIUM 5 MG PO TABS: ORAL_TABLET | ORAL | Status: DC

## 2011-06-25 NOTE — Telephone Encounter (Signed)
Received paper request on pt coumadin. Pt goes to coumadin clinic faxing over request to them for approval...06/25/11@9 :31am/LMB

## 2011-06-27 ENCOUNTER — Ambulatory Visit
Admission: RE | Admit: 2011-06-27 | Discharge: 2011-06-27 | Disposition: A | Discharge status: Home / Self Care | Payer: PRIVATE HEALTH INSURANCE | Source: Ambulatory Visit | Attending: Surgery | Admitting: Surgery

## 2011-06-27 DIAGNOSIS — G8918 Other acute postprocedural pain: Secondary | ICD-10-CM

## 2011-06-27 MED ORDER — IOHEXOL 300 MG/ML  SOLN
100.0000 mL | Freq: Once | INTRAMUSCULAR | Status: AC | PRN
  Administered 2011-06-27: 100 mL via INTRAVENOUS

## 2011-06-28 ENCOUNTER — Telehealth (INDEPENDENT_AMBULATORY_CARE_PROVIDER_SITE_OTHER): Payer: Self-pay | Admitting: General Surgery

## 2011-06-28 NOTE — Telephone Encounter (Signed)
[facility] CALLED REQUESTING REFILL FOR TRAMADOL/ REVIEWED WITH DR. Luisa Hart AND HE APPROVED 1 REFILL/ CALLED TO [facility]/ [phone]/ PT NOTIFIED/GY

## 2011-07-01 ENCOUNTER — Ambulatory Visit (INDEPENDENT_AMBULATORY_CARE_PROVIDER_SITE_OTHER): Payer: PRIVATE HEALTH INSURANCE | Admitting: Surgery

## 2011-07-01 ENCOUNTER — Encounter (INDEPENDENT_AMBULATORY_CARE_PROVIDER_SITE_OTHER): Payer: Self-pay | Admitting: Surgery

## 2011-07-01 VITALS — BP 120/80 | HR 72 | Temp 98.4°F | Resp 12 | Ht <= 58 in | Wt 214.0 lb

## 2011-07-01 DIAGNOSIS — K632 Fistula of intestine: Secondary | ICD-10-CM

## 2011-07-01 NOTE — Progress Notes (Signed)
Patient ID: [name], female   DOB: 1976-03-04, 36 y.o.   MRN: [id number]  Chief Complaint  Patient presents with  . Follow-up    F/U COLON SURGERY    HPI [name] is a 36 y.o. female.   HPI The patient returns today in followup. Her CT scan and rectal contrast showed no evidence of persistent fistula from sigmoid colon to abdominal wall. She is doing well with better pain control.  Past Medical History  Diagnosis Date  . OBESITY   . PULMONARY EMBOLISM 02/2009    B PE - chronic anticoag (heme=murinson, [facility])  . Uterine myoma   . POLYCYSTIC OVARIAN DISEASE   . OBSTRUCTIVE SLEEP APNEA   . ANEMIA-IRON DEFICIENCY   . Fibroid tumor     hx of    Past Surgical History  Procedure Date  . Myomectomy abdominal approach 1997    No complications  . Myomectomy 02/12/2011    Procedure: MYOMECTOMY;  Surgeon: Turner Daniels, MD;  Location: [facility];  Service: Gynecology;  Laterality: N/A;  . Colostomy     Family History  Problem Relation Age of Onset  . Allergies Mother   . Asthma Paternal Grandmother   . Emphysema Paternal Grandfather     Social History History  Substance Use Topics  . Smoking status: Never Smoker   . Smokeless tobacco: Never Used  . Alcohol Use: No    Allergies  Allergen Reactions  . Oxycodone Palpitations  . Gluten Other (See Comments)    Mucus in back of throat and choking    Current Outpatient Prescriptions  Medication Sig Dispense Refill  . Docusate Calcium (STOOL SOFTENER PO) Take 100 mg by mouth daily.        . ferrous sulfate 325 (65 FE) MG tablet        . Fexofenadine HCl (ALLEGRA PO) Take by mouth daily.        Marland Kitchen HYDROmorphone (DILAUDID) 2 MG tablet Take 1 tablet (2 mg total) by mouth every 6 (six) hours as needed for pain.  60 tablet  0  . Simethicone (GAS-X PO) Take by mouth 2 (two) times daily.        . traMADol (ULTRAM) 50 MG tablet Take 1 tablet (50 mg total) by mouth every 6 (six) hours as needed for pain.  20 tablet  0  . warfarin  (COUMADIN) 5 MG tablet Take as directed by anticoagulation clinic  60 tablet  3  . HYDROcodone-acetaminophen (NORCO) 5-325 MG per tablet Take 1 tablet by mouth every 4 (four) hours as needed for pain.  40 tablet  0    Review of Systems Review of Systems  Constitutional: Positive for fatigue.  HENT: Negative.   Eyes: Negative.   Respiratory: Negative.   Cardiovascular: Negative.   Gastrointestinal: Positive for abdominal pain.  Genitourinary: Negative.   Musculoskeletal: Negative.     Blood pressure 120/80, pulse 72, temperature 98.4 F (36.9 C), resp. rate 12, height 4\' 10"  (1.473 m), weight 214 lb (97.07 kg), last menstrual period 06/16/2011.  Physical Exam Physical Exam  Constitutional: She appears well-developed and well-nourished.  HENT:  Head: Normocephalic and atraumatic.  Eyes: EOM are normal. Pupils are equal, round, and reactive to light.  Neck: Normal range of motion. Neck supple.  Cardiovascular: Normal rate and regular rhythm.   Pulmonary/Chest: Effort normal and breath sounds normal.  Abdominal:      Data Reviewed CT rectal contrast no fistila  Assessment    Diverting loop colostomy secondary  to colonic injury during myomectomy with suture dehiscence at repair site.    Plan    Closure of loop colostomy. Will ask for Dr Murinson's input for anticoagulation.Risks include bleeding, infection, anastomotic breakdown with leak, fistula, abscess, need for further surgical procedure, wound complications, hernia complications, complications of hypercoagulable state, DVT, stroke, emboli, sepsis, respiratory failure, multisystem organ failure, and death. She wil need a preop PICC line.       [name] A. 07/01/2011, 2:59 PM

## 2011-07-01 NOTE — Patient Instructions (Signed)
Please call office once you have an appointment with Dr Arline Asp to schedule surgery and PICC line placement before surgery.

## 2011-07-05 ENCOUNTER — Ambulatory Visit (INDEPENDENT_AMBULATORY_CARE_PROVIDER_SITE_OTHER): Payer: PRIVATE HEALTH INSURANCE

## 2011-07-05 DIAGNOSIS — I2699 Other pulmonary embolism without acute cor pulmonale: Secondary | ICD-10-CM

## 2011-07-05 DIAGNOSIS — Z7901 Long term (current) use of anticoagulants: Secondary | ICD-10-CM

## 2011-07-08 ENCOUNTER — Telehealth: Payer: Self-pay | Admitting: Oncology

## 2011-07-08 ENCOUNTER — Other Ambulatory Visit: Payer: Self-pay | Admitting: Medical Oncology

## 2011-07-08 DIAGNOSIS — D509 Iron deficiency anemia, unspecified: Secondary | ICD-10-CM

## 2011-07-08 DIAGNOSIS — I2699 Other pulmonary embolism without acute cor pulmonale: Secondary | ICD-10-CM

## 2011-07-08 NOTE — Telephone Encounter (Signed)
l/m with appt for 07/15/11  aom

## 2011-07-15 ENCOUNTER — Encounter: Payer: Self-pay | Admitting: Oncology

## 2011-07-15 ENCOUNTER — Ambulatory Visit (HOSPITAL_BASED_OUTPATIENT_CLINIC_OR_DEPARTMENT_OTHER): Payer: PRIVATE HEALTH INSURANCE | Admitting: Oncology

## 2011-07-15 ENCOUNTER — Other Ambulatory Visit (HOSPITAL_BASED_OUTPATIENT_CLINIC_OR_DEPARTMENT_OTHER): Payer: PRIVATE HEALTH INSURANCE

## 2011-07-15 VITALS — BP 120/84 | HR 96 | Temp 98.2°F | Ht <= 58 in | Wt 211.4 lb

## 2011-07-15 DIAGNOSIS — I82890 Acute embolism and thrombosis of other specified veins: Secondary | ICD-10-CM

## 2011-07-15 DIAGNOSIS — D509 Iron deficiency anemia, unspecified: Secondary | ICD-10-CM

## 2011-07-15 DIAGNOSIS — I2699 Other pulmonary embolism without acute cor pulmonale: Secondary | ICD-10-CM

## 2011-07-15 LAB — COMPREHENSIVE METABOLIC PANEL
AST: 17 U/L (ref 0–37)
Albumin: 3.6 g/dL (ref 3.5–5.2)
Alkaline Phosphatase: 109 U/L (ref 39–117)
BUN: 9 mg/dL (ref 6–23)
Glucose, Bld: 102 mg/dL — ABNORMAL HIGH (ref 70–99)
Potassium: 3.8 mEq/L (ref 3.5–5.3)
Total Bilirubin: 0.2 mg/dL — ABNORMAL LOW (ref 0.3–1.2)

## 2011-07-15 LAB — PROTIME-INR
INR: 1.5 — ABNORMAL LOW (ref 2.00–3.50)
Protime: 18 Seconds — ABNORMAL HIGH (ref 10.6–13.4)

## 2011-07-15 LAB — CBC WITH DIFFERENTIAL/PLATELET
Basophils Absolute: 0 10*3/uL (ref 0.0–0.1)
EOS%: 1.4 % (ref 0.0–7.0)
Eosinophils Absolute: 0.1 10*3/uL (ref 0.0–0.5)
LYMPH%: 23.3 % (ref 14.0–49.7)
MCH: 24.8 pg — ABNORMAL LOW (ref 25.1–34.0)
MCV: 75 fL — ABNORMAL LOW (ref 79.5–101.0)
MONO%: 3.2 % (ref 0.0–14.0)
Platelets: 276 10*3/uL (ref 145–400)
RBC: 5.23 10*6/uL (ref 3.70–5.45)
RDW: 18.3 % — ABNORMAL HIGH (ref 11.2–14.5)

## 2011-07-15 LAB — IRON AND TIBC
%SAT: 6 % — ABNORMAL LOW (ref 20–55)
Iron: 28 ug/dL — ABNORMAL LOW (ref 42–145)
TIBC: 474 ug/dL — ABNORMAL HIGH (ref 250–470)
UIBC: 446 ug/dL — ABNORMAL HIGH (ref 125–400)

## 2011-07-15 NOTE — Progress Notes (Signed)
This office note has been dictated.  #[id number]

## 2011-07-15 NOTE — Progress Notes (Signed)
CC:   Alicia A. Felicity Coyer, MD [name], M.D. [name], M.D.  PROBLEM LIST: 1. Bilateral pulmonary emboli on 01/31/2009.  The patient was on a     NuvaRing at that time. 2. 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 3. Polycystic ovary disease. 4. Obesity. 5. Sleep apnea. 6. Uterine fibroids. 7. 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 8. 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 9. Long-term anticoagulation.  MEDICATIONS: 1. Docusate 100 mg daily. 2. Ferrous sulfate 325 mg daily. 3. Penicillin 500 mg four times a day for dental abscess. 4. Ultram 50 mg every 6 hours as needed. 5. Coumadin 7.5 mg daily.  HISTORY:  [name] is a 36 year old female who we last saw at the cancer center on 01/17/2011.  We have followed [name] since September 2010 following her diagnosis of bilateral pulmonary emboli related to being heterozygous for the prothrombin II gene mutation.  The patient was on a NuvaRing at that time.  [name] had been having a lot of bleeding from a large uterine fibroid. We had looked for von Willebrand disease and that was negative.  [name] underwent a laparoscopic myotomy by Dr. Rana Snare on 02/12/2011. Unfortunately, the patient had a lot of adhesions and there was some tearing or perforation of the small intestine.  This was repaired by Dr. Luisa Hart.  The patient, unfortunately, had to come back a week later because of an abscess and the development of a colocutaneous fistula. She had a rather extended hospitalization.  She was receiving IV antibiotics in the hopes that the fistula would close.  She was maintained on anticoagulation, I believe, with Lovenox or heparin.  In any event, conservative management apparently did not resolve the problem and a diverting loop sigmoid colostomy was carried out on 03/27/2011.  The patient has continued to do well.  She is on  Coumadin currently at 7.5 mg daily.  She has been followed closely by Dr. Luisa Hart, who would like to reverse the colostomy and is seeking some assistance for management of the anticoagulation with bridging with Lovenox.  The patient is being seen by the  Coumadin Clinic.  Apparently, she has her protime checked every few weeks.  The patient tells me that 2 weeks ago her INR was 1.6 when she was on 5 mg daily.  She denies eating a lot of salads.  She just started taking penicillin a couple of days ago for a tooth infection and is scheduled to have a root canal in the morning.  The patient denies any bleeding or bruising problems.  She has been having some abdominal pain for which she takes Ultram.  She has had followup imaging studies and apparently the abdominal problem is slowly resolving.  She has an open wound that apparently is decreasing in size. Extensive records from the patient's hospitalizations and 3 surgeries were reviewed.  PHYSICAL EXAMINATION:  General Appearance:  [name] looks well.  Vital Signs:  Weight is stable at 211 pounds and 6.4 ounces.  Height is 4 feet and 10 inches.  Body surface area 1.98 m2.  Blood pressure 120/84. Other vital signs are normal.  She is afebrile.  HEENT:  Her cheeks look somewhat flushed.  No scleral icterus.  Mouth and pharynx are benign. No peripheral adenopathy palpable.  Heart:  Normal.  Lungs:  Normal. Breasts:  Not examined.  Abdomen:  Quite obese with some tenderness, but the abdomen is soft.  She has a colostomy in the left lower quadrant.  I did not examine her open wound.  Extremities:  No peripheral edema or clubbing.  Neurologic Exam:  Grossly normal.  LABORATORY DATA TODAY:  White count 6.9, ANC 4.9, hemoglobin 13, hematocrit 39.2, platelets 276,000.  MCV 75, MCH 24.8.  Protime was 18 with an INR of 1.50.  This result will be forwarded to the Coumadin Clinic.  Chemistries today were essentially normal.  Albumin was  3.6, BUN 9, creatinine 0.61.  Iron studies from 01/17/2011:  Ferritin was 54, iron saturation 14%.  Iron studies are pending today.  IMAGING STUDIES: 1. CT scan of the abdomen and pelvis with IV contrast carried out on     02/20/2011 showed abscess collection identified superior to the     uterus extending along the left lateral aspect, 10.2 x 4.2 x 5 cm     in size, appearing to extend into the left lateral aspect of the     uterus and questionably site of myomectomy.  There was additional     gas collection seen in the anterior abdominal wall fascial planes.     There was rounded focal fluid collection with enhancing wall in the     region of the left adnexa, possibly an ovarian cyst.  There was an     open ventral wound anterior abdominal wall of pelvis. 2. CT scan of the abdomen and pelvis with IV contrast on 03/12/2011     showed persistent fistulous tract from the sigmoid colon to the     anterior abdominal wall, slight interval decrease in the size of     the abscess just superior to the uterus, and stable surgical     changes involving the uterus. 3. CT scan of the abdomen and pelvis without IV contrast on 05/09/2011     showed diverting loop colostomy in the left lower quadrant with no     complicating features.  There was a tiny residual colocutaneous     fistula.  There was minimal residual subcutaneous and anterior     abdominal wall inflammation, but no abscess. 4. CT scan of the abdomen and pelvis with IV contrast on 06/27/2011     showed left lower quadrant loop colostomy with no extravasation of     contrast, although the proximal sigmoid is suboptimally opacified.     The appearance suggested the closure of the fistula that was seen     on the study of 05/09/2011.  IMPRESSION AND PLAN:  [name] seems to be doing well at the present time.  Today, her protime and INR are suboptimal.  We will forward this result on to the Coumadin Clinic at [facility] for further adjustment  of the patient's Coumadin.  I will be in contact with Dr. Luisa Hart regarding the bridging procedure for the patient's planned colostomy closure.  Apparently, no date has been set for this and I believe Dr. Luisa Hart is thinking of doing this sometime in the next few weeks.  At the present time, the Coumadin Clinic is managing the patient's Coumadin dose.  We will see whether the Coumadin Clinic should manage the bridging procedure or whether Dr. Luisa Hart would prefer me to do that.  As has been previously stated, the patient requires ongoing anticoagulation and avoidance of hormone therapy.  We will plan to see [name] again in approximately 2 months at which time we will check CBC, chemistries, protime, and iron studies.  Again, the records from the patient's  hospitalizations and surgeries were reviewed.   ______________________________ Samul Dada, M.D. DSM/MEDQ  D:  07/15/2011  T:  07/15/2011  Job:  [id number]

## 2011-07-16 ENCOUNTER — Telehealth: Payer: Self-pay | Admitting: Oncology

## 2011-07-16 ENCOUNTER — Encounter: Payer: Self-pay | Admitting: Medical Oncology

## 2011-07-16 ENCOUNTER — Ambulatory Visit (INDEPENDENT_AMBULATORY_CARE_PROVIDER_SITE_OTHER): Payer: Self-pay | Admitting: Cardiology

## 2011-07-16 ENCOUNTER — Telehealth: Payer: Self-pay | Admitting: Medical Oncology

## 2011-07-16 DIAGNOSIS — Z7901 Long term (current) use of anticoagulants: Secondary | ICD-10-CM

## 2011-07-16 DIAGNOSIS — I2699 Other pulmonary embolism without acute cor pulmonale: Secondary | ICD-10-CM

## 2011-07-16 DIAGNOSIS — R0989 Other specified symptoms and signs involving the circulatory and respiratory systems: Secondary | ICD-10-CM

## 2011-07-16 NOTE — Telephone Encounter (Signed)
I called pt per Dr. Arline Asp to verify  Iron dose. She states she takes one tablet daily. Per Dr. Arline Asp she can take 1 po BID with meal. She voiced understanding.

## 2011-07-16 NOTE — Telephone Encounter (Signed)
S/w pt re appt for 4/11 @ 1:30 pm.

## 2011-07-16 NOTE — Progress Notes (Signed)
PT/INR from 07/16/11 faxed to [facility]

## 2011-07-18 ENCOUNTER — Ambulatory Visit: Payer: PRIVATE HEALTH INSURANCE | Admitting: Internal Medicine

## 2011-07-19 ENCOUNTER — Encounter (INDEPENDENT_AMBULATORY_CARE_PROVIDER_SITE_OTHER): Payer: Self-pay | Admitting: Surgery

## 2011-07-19 ENCOUNTER — Telehealth (INDEPENDENT_AMBULATORY_CARE_PROVIDER_SITE_OTHER): Payer: Self-pay | Admitting: General Surgery

## 2011-07-19 ENCOUNTER — Encounter: Payer: PRIVATE HEALTH INSURANCE | Admitting: *Deleted

## 2011-07-19 NOTE — Telephone Encounter (Signed)
FAXED REQ FOR ULTRAM/TRAMADOL RECEIVED FROM [facility] [place].O. PER DR. CORNETT/ PHARMACY NOTIFIED OF [place] FOR REFILL/ [phone]/GY

## 2011-07-26 ENCOUNTER — Encounter: Payer: Self-pay | Admitting: Internal Medicine

## 2011-07-26 ENCOUNTER — Encounter (INDEPENDENT_AMBULATORY_CARE_PROVIDER_SITE_OTHER): Payer: PRIVATE HEALTH INSURANCE | Admitting: *Deleted

## 2011-07-26 ENCOUNTER — Ambulatory Visit (INDEPENDENT_AMBULATORY_CARE_PROVIDER_SITE_OTHER): Payer: PRIVATE HEALTH INSURANCE | Admitting: Internal Medicine

## 2011-07-26 DIAGNOSIS — E669 Obesity, unspecified: Secondary | ICD-10-CM

## 2011-07-26 DIAGNOSIS — I2699 Other pulmonary embolism without acute cor pulmonale: Secondary | ICD-10-CM

## 2011-07-26 DIAGNOSIS — Z7901 Long term (current) use of anticoagulants: Secondary | ICD-10-CM

## 2011-07-26 DIAGNOSIS — K632 Fistula of intestine: Secondary | ICD-10-CM

## 2011-07-26 MED ORDER — THERA VITAL M PO TABS: 1.0000 | ORAL_TABLET | Freq: Every day | ORAL | Status: DC

## 2011-07-26 NOTE — Assessment & Plan Note (Signed)
Weight loss noted - encouraged continued work and success on same [employer] Readings from Last 3 Encounters:  07/26/11 211 lb (95.709 kg)  07/15/11 211 lb 6.4 oz (95.89 kg)  07/01/11 214 lb (97.07 kg)

## 2011-07-26 NOTE — Progress Notes (Signed)
Subjective:    Patient ID: [name], female    DOB: January 21, 1976, 36 y.o.   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 -hosp for same 02/2009 at [facility]  prior to that dx been on estrogen tx nuvaring for PCOS - hormones felt to be precipitating factor Also heterozygous for prothrombin II gene mutation in 03/2010= evidence for hypercoag state on prior lab panel -  has been following with heme (murrinson) - 10/2010 and 01/2011 OV " I should probably never come off coumadin" also [facility] for coumadin mgmt - no bleeding or bruising problems   obesity - reports weight has decreased since 02/2011 surgery/complications  Follows gluten free diet but no exercise program -  no known complications related to obesity (chol ok, no DM, no arthritis)   OSA - sleep study done 11/2009 - status post pulm eval for same -  Declined CPAP - reports improved on gluten free diet  Past Medical History  Diagnosis Date  . OBESITY   . PULMONARY EMBOLISM 02/2009    B PE - chronic anticoag (heme=[facility], [facility])  . 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 POLYCYSTIC OVARIAN DISEASE   . OBSTRUCTIVE SLEEP APNEA   . ANEMIA-IRON DEFICIENCY   . 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 Prothrombin mutation     heterozygous prothrombin II mutation   Family History  Problem Relation Age of Onset  . Allergies Mother   . Asthma Paternal Grandmother   . Emphysema Paternal Grandfather    History  Substance Use Topics  . Smoking status: Never Smoker   . Smokeless tobacco: Never Used  . Alcohol Use: No    Review of Systems  Constitutional: Negative for fever.  Respiratory: Negative for cough and shortness of breath.   Cardiovascular: Negative  for chest pain.  Gastrointestinal: Negative for abdominal pain.  Musculoskeletal: Negative for gait problem.  Skin: Negative for rash.  Neurological: Negative for dizziness.  No other specific complaints in a complete review of systems (except as listed in HPI above).     Objective:   Physical Exam  BP 102/76  Pulse 102  Temp(Src) 97.3 F (36.3 C) (Oral)  Resp 16  Wt 211 lb (95.709 kg)  SpO2 99%  LMP 07/22/2011 Wt Readings from Last 3 Encounters:  07/26/11 211 lb (95.709 kg)  07/15/11 211 lb 6.4 oz (95.89 kg)  07/01/11 214 lb (97.07 kg)   Constitutional: She is obese, short statured. She appears well-developed and well-nourished. No distress.  Neck: Normal range of motion. Neck supple. No JVD present. No thyromegaly present. non tender and no appreciable nodules Cardiovascular: Normal rate, regular rhythm and normal heart sounds.  No murmur heard. chronic 1+ BLE edema. Pulmonary/Chest: Effort normal and breath sounds normal. No respiratory distress. She has no wheezes.  Abd: obese, SNT, colostomy intact - +BS Neurological: She is alert and oriented to person, place, and time. No cranial nerve deficit. Coordination normal. cognition and speech normal Skin: Skin is warm and dry. No rash noted. No erythema.  Psychiatric: She has a normal mood and affect. Her behavior is normal. Judgment and thought content normal.  Lab Results  Component Value Date   WBC 6.9 07/15/2011   HGB  13.0 07/15/2011   HCT 39.2 07/15/2011   PLT 276 07/15/2011   GLUCOSE 102* 07/15/2011   CHOL 135 04/01/2011   TRIG 156* 04/01/2011   HDL 74 02/01/2009   LDLCALC  Value: 82        02/01/2009   ALT 18 07/15/2011   AST 17 07/15/2011   NA 140 07/15/2011   K 3.8 07/15/2011   CL 105 07/15/2011   CREATININE 0.61 07/15/2011   BUN 9 07/15/2011   CO2 23 07/15/2011   TSH 0.450 *Test methodology is 3rd generation TSH* 02/01/2009   INR 1.50* 07/15/2011   HGBA1C  Value: 5.3 02/01/2009     Lab Results  Component Value Date    INR 1.50* 07/15/2011   INR 1.6 07/05/2011   INR 2.5 06/14/2011   PROTIME 18.0* 07/15/2011   PROTIME 30.0* 04/07/2009   PROTIME 24.0* 03/31/2009    Assessment & Plan:  preop eval - planned colo repair reviewed and interval hx since myomectomy 02/2011 - Coordination of periop anticoag  Ongoing with [facility] (LMWH when stopping coumadin and postop IV hep while IP and LMWH hen ok with surgeon until INR>2 overlap) - note heme (murrinson) will also involved  Time spent with p today 25 minutes, greater than 50% time spent counseling patient on colectomy hx and anticoag plans and medication review. Also review of prior records

## 2011-07-26 NOTE — Assessment & Plan Note (Signed)
B PE dx 01/2009 while on hormone tx- on chronic anticoag indefinitely  heterozygous for prothrombin II mutation (labs 03/2010) = specific hypercoag dz identified Follows with heme and [facility] for same Overlap with LMWH preop per [facility] planned  Postop anticipate IV hep or LMWH overlap with coumadin resumption per surgeon preferences

## 2011-07-26 NOTE — Patient Instructions (Signed)
It was good to see you today. We have reviewed your prior records including labs and tests today Medications reviewed, no changes at this time. follow up with Drs. Cornet,  Murinson  And coumadin clinic as planned -  Please schedule followup in 6 months, call sooner if problems.

## 2011-07-26 NOTE — Assessment & Plan Note (Signed)
S/p diverting loop colostomy 03/2011 for colocutaneous fistula complication of myomectomy/adhesions 02/2011 Now planning reversal/repair  Interval history reviewed - anticoag as ongoing - no medical suggestions for change

## 2011-07-31 ENCOUNTER — Other Ambulatory Visit (INDEPENDENT_AMBULATORY_CARE_PROVIDER_SITE_OTHER): Payer: Self-pay | Admitting: Surgery

## 2011-07-31 ENCOUNTER — Telehealth: Payer: Self-pay

## 2011-07-31 DIAGNOSIS — K632 Fistula of intestine: Secondary | ICD-10-CM

## 2011-07-31 NOTE — Telephone Encounter (Signed)
Left message on machine to call back  

## 2011-07-31 NOTE — Telephone Encounter (Signed)
Message copied by Donata Duff on Wed Jul 31, 2011  8:38 AM ------      Message from: Rob Bunting P      Created: Wed Jul 31, 2011  8:27 AM       [name] take care of this. Thanks            Tnya Ades,      Can you set her up with ngi (in next week or so) with me to discuss this and schedule colonoscopy from there.  Thanks.                              ----- Message -----         From: Clovis Pu. Cornett, MD         Sent: 07/31/2011   8:17 AM           To: Rob Bunting, MD            [name]            I am referring this nice lady for a flex sigmoidoscopy to evaluate sigmoid colon prior to closure of a loop colostomy.  She had a myomectomy by one of the Gyn MD in town is sept.  He injured her sigmoid colon and I was called in emergently  To fix it.  This broke down 7 days later and she had an EC fistula.  She underwent diversion 6 weeks later with descending loop colostomy and has healed her fistula.  She is on coumadin for a clotting disorder followed by Murinson and coumadin clinic. I wanted a BE but no barium available.  If her injury site is narrowed,  She will need a resection and closure of ostomy which will be a bigger operation.  Thanks for seeing her.  Call me once you see her if you have any questions or think of anything else she may need.      [name]

## 2011-07-31 NOTE — Telephone Encounter (Signed)
Pt has been scheduled and pt is aware new pt paper work has been mailed

## 2011-08-01 ENCOUNTER — Telehealth (INDEPENDENT_AMBULATORY_CARE_PROVIDER_SITE_OTHER): Payer: Self-pay | Admitting: General Surgery

## 2011-08-01 NOTE — Telephone Encounter (Signed)
Message copied by Lannie Fields on Thu Aug 01, 2011 10:02 AM ------      Message from: Harriette Bouillon A      Created: Wed Jul 31, 2011  8:15 AM       Order in for GI referral for flex sigmoidoscopy to evaluate colon prior to closure of ostomy.  Dr Christella Hartigan preferred.  Let pt know this will be required before surgery to make sure it is safe to close her ostomy.

## 2011-08-01 NOTE — Telephone Encounter (Signed)
I NOTIFIED [name] OF PLANNED APPT WITH DR JACOBS AND WHY.[name]

## 2011-08-02 ENCOUNTER — Ambulatory Visit (INDEPENDENT_AMBULATORY_CARE_PROVIDER_SITE_OTHER): Payer: PRIVATE HEALTH INSURANCE | Admitting: *Deleted

## 2011-08-02 DIAGNOSIS — Z7901 Long term (current) use of anticoagulants: Secondary | ICD-10-CM

## 2011-08-02 DIAGNOSIS — I2699 Other pulmonary embolism without acute cor pulmonale: Secondary | ICD-10-CM

## 2011-08-02 LAB — POCT INR
INR: 2.5
INR: 2.5

## 2011-08-05 ENCOUNTER — Ambulatory Visit: Payer: PRIVATE HEALTH INSURANCE | Admitting: Internal Medicine

## 2011-08-09 ENCOUNTER — Ambulatory Visit (INDEPENDENT_AMBULATORY_CARE_PROVIDER_SITE_OTHER): Payer: PRIVATE HEALTH INSURANCE | Admitting: Gastroenterology

## 2011-08-09 ENCOUNTER — Encounter: Payer: Self-pay | Admitting: Gastroenterology

## 2011-08-09 DIAGNOSIS — K56699 Other intestinal obstruction unspecified as to partial versus complete obstruction: Secondary | ICD-10-CM

## 2011-08-09 DIAGNOSIS — K56609 Unspecified intestinal obstruction, unspecified as to partial versus complete obstruction: Secondary | ICD-10-CM

## 2011-08-09 DIAGNOSIS — K900 Celiac disease: Secondary | ICD-10-CM

## 2011-08-09 NOTE — Patient Instructions (Addendum)
You will be set up for a flex sigmoidoscopy to check for stricture in colon.  We will decide on the type of prep after Dr. Christella Hartigan and Cornett discuss your case. You will be set up for an upper endoscopy to biopsy for celiac sprue.   Patient to remain on coumadin per Dr Christella Hartigan.

## 2011-08-09 NOTE — Progress Notes (Signed)
HPI: This is a   very pleasant 36 year old woman who underwent uterine myomectomy several months ago with injury of colon. Dr. Luisa Hart  was called in to assist with repairing the damage . That repair broke down and she suffered colocutaneous fistula. She eventually underwent loop colostomy.    her surgeon is now considering taking down the colostomy but he has specific questions about the patency at the injury site and has requested flexible sigmoidoscopy prior to then.   She has significant phlegm, choking especially at night. Avoiding gluten completely resolves this problem. She is interested in knowing whether she has celiac sprue or just gluten intolerance and so she wants biopsy of her upper intestine as well.    Review of systems: Pertinent positive and negative review of systems were noted in the above HPI section. Complete review of systems was performed and was otherwise normal.    Past Medical History  Diagnosis Date  . OBESITY   . PULMONARY EMBOLISM 02/2009    B PE - chronic anticoag (heme=murinson, [facility])  . 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 POLYCYSTIC OVARIAN DISEASE   . OBSTRUCTIVE SLEEP APNEA   . ANEMIA-IRON DEFICIENCY   . 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 Prothrombin mutation     heterozygous prothrombin II mutation    Past Surgical History  Procedure Date  . Myomectomy abdominal approach 1997    No complications  . Myomectomy 02/12/2011    Procedure: MYOMECTOMY;  Surgeon: Turner Daniels, MD;  Location: [facility];  Service: Gynecology;  Laterality: N/A;  . Colostomy 03/2011    Dr. Luisa Hart    Current Outpatient Prescriptions  Medication Sig Dispense Refill  . Charcoal Activated (ACTIVATED CHARCOAL) POWD by Does not apply route. As needed      . Docusate Calcium (STOOL SOFTENER PO) Take 100 mg by mouth daily.        . ferrous sulfate 325 (65 FE) MG tablet       . Multiple Vitamins-Minerals  (MULTIVITAMIN) tablet Take 1 tablet by mouth daily.      . traMADol (ULTRAM) 50 MG tablet Take 1 tablet (50 mg total) by mouth every 6 (six) hours as needed for pain.  20 tablet  0  . warfarin (COUMADIN) 5 MG tablet Take 5 mg by mouth daily. Take 11/2 tabs po daily      . HYDROcodone-acetaminophen (NORCO) 5-325 MG per tablet Take 1 tablet by mouth every 4 (four) hours as needed for pain.  40 tablet  0    Allergies as of 08/09/2011 - Review Complete 08/09/2011  Allergen Reaction Noted  . Oxycodone Palpitations 02/05/2011  . Gluten Other (See Comments) 01/28/2011    Family History  Problem Relation Age of Onset  . Allergies Mother   . Asthma Paternal Grandmother   . Emphysema Paternal Grandfather   . Colon cancer Neg Hx   . Diabetes Maternal Grandmother   . Breast cancer Mother     History   Social History  . Marital Status: Single    Spouse Name: N/A    Number of Children: 0  . Years of Education: N/A   Occupational History  . Freelance Photographer    Social History Main Topics  . Smoking status: Never Smoker   . Smokeless tobacco: Never Used  . Alcohol Use: No  . Drug Use: No  . Sexually Active: Not on file   Other Topics Concern  .  Not on file   Social History Narrative  . No narrative on file       Physical Exam: BP 116/70  Pulse 72  Ht 4\' 10"  (1.473 m)  Wt 208 lb 9.6 oz (94.62 kg)  BMI 43.60 kg/m2  LMP 07/22/2011 Constitutional: generally well-appearing Psychiatric: alert and oriented x3 Eyes: extraocular movements intact Mouth: oral pharynx moist, no lesions Neck: supple no lymphadenopathy Cardiovascular: heart regular rate and rhythm Lungs: clear to auscultation bilaterally Abdomen: soft, nontender, nondistended, no obvious ascites, no peritoneal signs, normal bowel sounds; left-sided colostomy with stool, gas. Low incision with bandage. Continuing to heal.  Extremities: no lower extremity edema bilaterally Skin: no lesions on visible  extremities    Assessment and plan: 36 y.o. female with  Loop colostomy, soon to have takedown.  I'm going to discuss her case with Dr. Luisa Hart.  We can certainly proceed with flexible sigmoidoscopy to evaluate the injury site. One question I have is whether he also wants an examination of her more proximal colon and that would obviously change the type of prep that we would use. I will send this note to him and we'll plan to get in touch with him early next week about the specific question. We will also plan on EGD at same time to biopsy her upper intestine, checking for celiac sprue.*

## 2011-08-12 ENCOUNTER — Encounter: Payer: Self-pay | Admitting: Gastroenterology

## 2011-08-12 ENCOUNTER — Telehealth: Payer: Self-pay | Admitting: Gastroenterology

## 2011-08-12 NOTE — Telephone Encounter (Signed)
Pt has been notified and will only use the 2 enemas for a prep

## 2011-08-12 NOTE — Telephone Encounter (Signed)
I spoke with Dr. Luisa Hart.  The segment from anus to the loop colostomy is the segment of question. Within is the area of injury.  If stenotic, then I will plan to label it with [place] Ink.  Patty, Please callher.  Need to prep, all she needs is 2 enemas prior to the flex sig.  No need for oral prep.  thanks

## 2011-08-16 ENCOUNTER — Ambulatory Visit (INDEPENDENT_AMBULATORY_CARE_PROVIDER_SITE_OTHER): Payer: PRIVATE HEALTH INSURANCE | Admitting: Pharmacist

## 2011-08-16 DIAGNOSIS — Z7901 Long term (current) use of anticoagulants: Secondary | ICD-10-CM

## 2011-08-16 DIAGNOSIS — I2699 Other pulmonary embolism without acute cor pulmonale: Secondary | ICD-10-CM

## 2011-09-05 ENCOUNTER — Telehealth: Payer: Self-pay

## 2011-09-05 NOTE — Telephone Encounter (Signed)
Pt called states she is scheduled for an endoscopy with Dr Rob Bunting on 09/10/11.  He has instructed pt to hold Coumadin x 5 days prior to procedure.  Pt called for clearance will forward to Dr Felicity Coyer. Pt had PE greater than 2 years ago on birth control, but is Heterozygous for the prothrombin II gene mutation.  Please advise if OK to hold Coumadin and if needs Lovenox bridging while holding Coumadin.  Thanks.

## 2011-09-05 NOTE — Telephone Encounter (Signed)
Ok to hold coumadin for procedure - Yes, please bridge with LMWH - thanks

## 2011-09-06 ENCOUNTER — Ambulatory Visit (INDEPENDENT_AMBULATORY_CARE_PROVIDER_SITE_OTHER): Payer: PRIVATE HEALTH INSURANCE | Admitting: Pharmacist

## 2011-09-06 DIAGNOSIS — Z7901 Long term (current) use of anticoagulants: Secondary | ICD-10-CM

## 2011-09-06 DIAGNOSIS — I2699 Other pulmonary embolism without acute cor pulmonale: Secondary | ICD-10-CM

## 2011-09-06 MED ORDER — ENOXAPARIN SODIUM 100 MG/ML ~~LOC~~ SOLN: 100.0000 mg | Freq: Two times a day (BID) | SUBCUTANEOUS | Status: DC

## 2011-09-06 NOTE — Telephone Encounter (Signed)
Pt aware.

## 2011-09-06 NOTE — Telephone Encounter (Signed)
Pt has appt in [facility] today made note on appt needs to be bridged with Lovenox at OV prior to upcoming endoscopy on 09/10/11.

## 2011-09-10 ENCOUNTER — Ambulatory Visit (AMBULATORY_SURGERY_CENTER): Payer: PRIVATE HEALTH INSURANCE | Admitting: Gastroenterology

## 2011-09-10 ENCOUNTER — Encounter: Payer: Self-pay | Admitting: Gastroenterology

## 2011-09-10 VITALS — BP 114/70 | HR 89 | Temp 98.6°F | Resp 19 | Ht <= 58 in | Wt 208.0 lb

## 2011-09-10 DIAGNOSIS — K56609 Unspecified intestinal obstruction, unspecified as to partial versus complete obstruction: Secondary | ICD-10-CM

## 2011-09-10 DIAGNOSIS — K900 Celiac disease: Secondary | ICD-10-CM

## 2011-09-10 DIAGNOSIS — D1330 Benign neoplasm of unspecified part of small intestine: Secondary | ICD-10-CM

## 2011-09-10 DIAGNOSIS — R933 Abnormal findings on diagnostic imaging of other parts of digestive tract: Secondary | ICD-10-CM

## 2011-09-10 DIAGNOSIS — R1013 Epigastric pain: Secondary | ICD-10-CM

## 2011-09-10 MED ORDER — SODIUM CHLORIDE 0.9 % IV SOLN: 500.0000 mL | INTRAVENOUS | Status: DC

## 2011-09-10 NOTE — Patient Instructions (Addendum)

## 2011-09-10 NOTE — Op Note (Signed)
[facility] [address] [place], [place]  [address]  FLEXIBLE SIGMOIDOSCOPY PROCEDURE REPORT  PATIENT:  [name], [name]  MR#:  [id number] BIRTHDATE:  05-31-1976, 36 yrs. old  GENDER:  female ENDOSCOPIST:  [name], MD Referred by:  Harriette Bouillon, M.D. PROCEDURE DATE:  09/10/2011 PROCEDURE:  Flexible Sigmoidoscopy with Submucosal Injection ASA CLASS:  Class II INDICATIONS:  s/p uterine myomectomy, injured colon, repaired surgically but abscess, colocutaneous fistula which slowly healed after loop colostomy; now considering takedown MEDICATIONS:  Fentanyl 100 mcg IV, These medications were titrated to patient response per physician's verbal order, Versed 9 mg IV  DESCRIPTION OF PROCEDURE:   After the risks benefits and alternatives of the procedure were thoroughly explained, informed consent was obtained.  Digital rectal exam was performed and revealed no rectal masses.   The [id number] endoscope was introduced through the anus and advanced to the sigmoid colon, without limitations.  The quality of the prep was fair.  The instrument was then slowly withdrawn as the mucosa was fully examined. <<PROCEDUREIMAGES>> At 35cm from anus there was site of injured colon, hypertrophied mucosa. Abutting this site directly the lumen of colon was quite narrowed and I was unable to pass the scope through it. This area was labeled with [place] Ink injection (see image1, image3, and image5). The examination was otherwise normal. Retroflexed views were not performed. The scope was then withdrawn from the patient and the procedure terminated. COMPLICATIONS:  None  ENDOSCOPIC IMPRESSION: 1) Abnormal mucosa at 35cm from anus, abutting a stenotic region of colon. This was labled with [place] Ink injection. 2) Otherwise normal examination.  RECOMMENDATIONS: Proceed with colostomy takedown, stricture resection.  ______________________________ [name],  MD  n. eSIGNED:   [name] at 09/10/2011 02:44 PM  Bradly Bienenstock, [phone]

## 2011-09-10 NOTE — Op Note (Signed)
[facility] [address] [place], [place]  [address]  ENDOSCOPY PROCEDURE REPORT  PATIENT:  [name], [name]  MR#:  [id number] BIRTHDATE:  10/17/1975, 36 yrs. old  GENDER:  female ENDOSCOPIST:  [name], MD PROCEDURE DATE:  09/10/2011 PROCEDURE:  EGD with biopsy, [address] ASA CLASS:  Class II INDICATIONS:  dyspepsia, seems improved when she avoids gluten MEDICATIONS  There was residual sedation effect present from prior procedure., These medications were titrated to patient response per physician's verbal order, Fentanyl 25 mcg IV, Versed 1 mg IV TOPICAL ANESTHETIC:  Cetacaine Spray  DESCRIPTION OF PROCEDURE:   After the risks benefits and alternatives of the procedure were thoroughly explained, informed consent was obtained.  The LB [id number] endoscope was introduced through the mouth and advanced to the second portion of the duodenum, without limitations.  The instrument was slowly withdrawn as the mucosa was fully examined. <<PROCEDUREIMAGES>> The upper, middle, and distal third of the esophagus were carefully inspected and no abnormalities were noted. The z-line was well seen at the GEJ. The endoscope was pushed into the fundus which was normal including a retroflexed view. The antrum,gastric body, first and second part of the duodenum were unremarkable. Biopsies taken from duodenum and sent to pathology (jar 1) (see image1, image2, image3, and image4).    Retroflexed views revealed no abnormalities.    The scope was then withdrawn from the patient and the procedure completed. COMPLICATIONS:  None  ENDOSCOPIC IMPRESSION: 1) Normal EGD, duodenum biopsied to check for Celiac Sprue  RECOMMENDATIONS: Await final pathology.  ______________________________ [name], MD  n. eSIGNED:   [name] at 09/10/2011 02:48 PM  Bradly Bienenstock, [phone]

## 2011-09-10 NOTE — Progress Notes (Signed)
Patient did not experience any of the following events: a burn prior to discharge; a fall within the facility; wrong site/side/patient/procedure/implant event; or a hospital transfer or hospital admission upon discharge from the facility. (G8907) Patient did not have preoperative order for IV antibiotic SSI prophylaxis. (G8918)  

## 2011-09-10 NOTE — Progress Notes (Signed)
Pt states she has had no coumadin since 09-06-11, lovenox injection yesterday. ewm  Pt has colostomy. ewm

## 2011-09-11 ENCOUNTER — Telehealth: Payer: Self-pay

## 2011-09-11 NOTE — Telephone Encounter (Signed)
  Follow up Call-  Call back number 09/10/2011  Post procedure Call Back phone  # [phone]  Permission to leave phone message Yes     Patient questions:  Do you have a fever, pain , or abdominal swelling? no Pain Score  0 *  Have you tolerated food without any problems? yes  Have you been able to return to your normal activities? yes  Do you have any questions about your discharge instructions: Diet   no Medications  no Follow up visit  no  Do you have questions or concerns about your Care? no  Actions: * If pain score is 4 or above: No action needed, pain <4.

## 2011-09-12 ENCOUNTER — Encounter: Payer: Self-pay | Admitting: Oncology

## 2011-09-12 ENCOUNTER — Other Ambulatory Visit (INDEPENDENT_AMBULATORY_CARE_PROVIDER_SITE_OTHER): Payer: Self-pay

## 2011-09-12 ENCOUNTER — Other Ambulatory Visit (HOSPITAL_BASED_OUTPATIENT_CLINIC_OR_DEPARTMENT_OTHER): Payer: PRIVATE HEALTH INSURANCE | Admitting: Lab

## 2011-09-12 ENCOUNTER — Telehealth: Payer: Self-pay | Admitting: Oncology

## 2011-09-12 ENCOUNTER — Ambulatory Visit (HOSPITAL_BASED_OUTPATIENT_CLINIC_OR_DEPARTMENT_OTHER): Payer: PRIVATE HEALTH INSURANCE | Admitting: Oncology

## 2011-09-12 VITALS — BP 109/85 | HR 75 | Temp 97.8°F | Ht <= 58 in | Wt 211.4 lb

## 2011-09-12 DIAGNOSIS — I2699 Other pulmonary embolism without acute cor pulmonale: Secondary | ICD-10-CM

## 2011-09-12 DIAGNOSIS — K632 Fistula of intestine: Secondary | ICD-10-CM

## 2011-09-12 DIAGNOSIS — D509 Iron deficiency anemia, unspecified: Secondary | ICD-10-CM

## 2011-09-12 DIAGNOSIS — E611 Iron deficiency: Secondary | ICD-10-CM

## 2011-09-12 LAB — COMPREHENSIVE METABOLIC PANEL
AST: 16 U/L (ref 0–37)
Albumin: 4 g/dL (ref 3.5–5.2)
Alkaline Phosphatase: 83 U/L (ref 39–117)
Calcium: 9.4 mg/dL (ref 8.4–10.5)
Chloride: 102 mEq/L (ref 96–112)
Glucose, Bld: 97 mg/dL (ref 70–99)
Potassium: 3.8 mEq/L (ref 3.5–5.3)
Sodium: 138 mEq/L (ref 135–145)
Total Protein: 6.7 g/dL (ref 6.0–8.3)

## 2011-09-12 LAB — CBC WITH DIFFERENTIAL/PLATELET
BASO%: 0.8 % (ref 0.0–2.0)
Basophils Absolute: 0 10*3/uL (ref 0.0–0.1)
EOS%: 1.7 % (ref 0.0–7.0)
HCT: 35.7 % (ref 34.8–46.6)
HGB: 11.8 g/dL (ref 11.6–15.9)
MCH: 26.8 pg (ref 25.1–34.0)
MONO#: 0.3 10*3/uL (ref 0.1–0.9)
NEUT#: 2.8 10*3/uL (ref 1.5–6.5)
RDW: 18.1 % — ABNORMAL HIGH (ref 11.2–14.5)
WBC: 5.1 10*3/uL (ref 3.9–10.3)
lymph#: 1.9 10*3/uL (ref 0.9–3.3)

## 2011-09-12 LAB — PROTHROMBIN TIME
INR: 1.02 (ref <1.50)
Prothrombin Time: 13.6 seconds (ref 11.6–15.2)

## 2011-09-12 LAB — FERRITIN: Ferritin: 40 ng/mL (ref 10–291)

## 2011-09-12 NOTE — Progress Notes (Signed)
CC:   Alicia A. Felicity Coyer, MD [name], M.D. [name], M.D. [name], MD  PROBLEM LIST:  1. Bilateral pulmonary emboli on 01/31/2009. The patient was on a  NuvaRing at that time. Doppler studies of the legs were negative for DVT. 2. 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  3. Polycystic ovary disease.  4. Obesity.  5. Sleep apnea.  6. Uterine fibroids.  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  8. 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  9. Long-term anticoagulation.   MEDICATIONS:  1. Docusate 100 mg daily.  2. Ferrous sulfate 325 mg twice daily.  3. Penicillin 500 mg four times a day for dental abscess.  4. Ultram 50 mg every 6 hours as needed.  5. Coumadin 7.5 mg daily.   HISTORY:  I saw [name] today for followup of her bilateral pulmonary emboli related to heterozygosity for a prothrombin II gene mutation.  The patient was on NuvaRing at the time of her pulmonary emboli.  Doppler study of the legs at that time were negative.  It has been recommended that [name] stay on anticoagulation indefinitely.  She was last seen by [place] on 07/15/2011.  She is still being followed closely by Dr. Luisa Hart.  The patient is hoping to have her colostomy reversed. Her incisional site is still healing.  [name] underwent a sigmoidoscopy and upper endoscopy by Dr. Rob Bunting on 09/10/2011.  Apparently at 35 cm from the anus, there is a stenotic region of the colon.  The scope could not be passed through it.  Upper endoscopy was unremarkable. Duodenum was biopsied to check for celiac sprue.  [name] states that she will be seeing a Careers adviser at [facility] for consideration of a colostomy reversal.  She had been on Lovenox bridging.  Her last dose of Lovenox was to be this morning.  She had started her Coumadin on the evening of her procedures, which was Tuesday April 9th.  The patient  states that she was having some heavy periods.  She has had heavy periods in the past because of fibroids.  Her pro times have been followed through the Coumadin Clinic at [facility]. The patient is without any major complaints today although she states that she is having some discomfort from hemorrhoids.  PHYSICAL EXAMINATION:  There is little change.  Weight is 211.4 pounds, which basically stable.  Height 4 feet 10 inches, body surface area 1.98 sq m. Blood pressure 109/85.  Other vital signs are normal.  There is no scleral icterus.  Mouth and pharynx are benign.  No peripheral adenopathy palpable.  Cheeks remain flushed.  Heart and lungs are normal.  Breasts are not examined.  Abdomen:  Obese with tenderness. There is a colostomy in the left lower quadrant.  The patient's lower abdominal incision is still open and bandaged.  I did not remove the bandages.  Extremities:  No peripheral edema or clubbing.  Neurologic exam was nonfocal.  LABORATORY DATA:  White cell count 5.1, ANC 2.8, hemoglobin 11.8, hematocrit 35.7, platelets 236,000.  MCV 81.1, MCH 26.8.  On 02/11, the MCV had been 75.0, MCH 24.8.  Pro time today as expected was low 13.6 with an INR of 1.02.  The patient is currently on Coumadin 12.5 mg today and then she will be going back to her baseline of 7.5 mg daily. Chemistries and iron studies are pending.  Chemistries from 07/15/2011 were normal.  Ferritin was 20, iron 28, which is low.  TIBC was 474, which was high, and the iron saturation was 6%, indicating iron Deficiency.   IMAGING STUDIES:  1. CT scan of the abdomen and pelvis with IV contrast carried out on  02/20/2011 showed abscess collection identified superior to the  uterus extending along the left lateral aspect, 10.2 x 4.2 x 5 cm  in size, appearing to extend into the left lateral aspect of the  uterus and questionably site of myomectomy. There was additional  gas collection seen in the anterior abdominal  wall fascial planes.  There was rounded focal fluid collection with enhancing wall in the  region of the left adnexa, possibly an ovarian cyst. There was an  open ventral wound anterior abdominal wall of pelvis.  2. CT scan of the abdomen and pelvis with IV contrast on 03/12/2011  showed persistent fistulous tract from the sigmoid colon to the  anterior abdominal wall, slight interval decrease in the size of  the abscess just superior to the uterus, and stable surgical  changes involving the uterus.  3. CT scan of the abdomen and pelvis without IV contrast on 05/09/2011  showed diverting loop colostomy in the left lower quadrant with no  complicating features. There was a tiny residual colocutaneous  fistula. There was minimal residual subcutaneous and anterior  abdominal wall inflammation, but no abscess.  4. CT scan of the abdomen and pelvis with IV contrast on 06/27/2011  showed left lower quadrant loop colostomy with no extravasation of  contrast, although the proximal sigmoid is suboptimally opacified.  The appearance suggested the closure of the fistula that was seen  on the study of 05/09/2011.   PROCEDURES: 1. Flexible sigmoidoscopy with submucosal injections on 09/10/2011.     At 35 cm from the anus, there was a site of injured colon,     hypertrophied mucosa.  Abutting this site directly, the lumen of     the colon was quite narrowed and the scope could not be passed     through it.  This area was labeled with [place] ink injection. 2. EGD with duodenal biopsies to check for celiac sprue were carried     out.  The EGD was normal.  IMPRESSION AND PLAN:  [name] seems to be doing well, is re-establishing her anticoagulation with Coumadin.  She will continue to have her pro times followed through the [facility].  She will continue to have her pro times and INR checked through [facility]. [name] will be seeing a Careers adviser at [facility] regarding  reversal of her colostomy.  Brailee is currently taking 1 iron a day.  Will be taking 2 ferrous sulfate daily.  We will call her if she continues to be iron deficient. She tells me that she has never had any IV iron or blood transfusions. If she is unable to correct her iron deficiency state with oral iron, then we may want to consider intravenous iron.  As stated previously, I feel that the circumstances, especially life- threatening pulmonary emboli in association with the  heterozygous prothrombin II gene mutation, would warrant an indefinite anticoagulation.  We will plan to check CBC and iron studies in 2 months.  I have asked Gidget to return in 4 months at which time we will check CBC, chemistries, iron study and a pro time.    ______________________________ Samul Dada, M.D. DSM/MEDQ  D:  09/12/2011  T:  09/12/2011  Job:  [id number]

## 2011-09-12 NOTE — Progress Notes (Signed)
This office note has been dictated.  #[id number]

## 2011-09-12 NOTE — Telephone Encounter (Signed)
appt made and printed for pt aom °

## 2011-09-17 ENCOUNTER — Other Ambulatory Visit: Payer: Self-pay | Admitting: Internal Medicine

## 2011-09-17 ENCOUNTER — Encounter: Payer: Self-pay | Admitting: Gastroenterology

## 2011-09-17 ENCOUNTER — Ambulatory Visit (INDEPENDENT_AMBULATORY_CARE_PROVIDER_SITE_OTHER): Payer: PRIVATE HEALTH INSURANCE | Admitting: *Deleted

## 2011-09-17 DIAGNOSIS — Z7901 Long term (current) use of anticoagulants: Secondary | ICD-10-CM

## 2011-09-17 DIAGNOSIS — I2699 Other pulmonary embolism without acute cor pulmonale: Secondary | ICD-10-CM

## 2011-09-17 LAB — POCT INR
INR: 1.8
INR: 1.8

## 2011-09-27 ENCOUNTER — Ambulatory Visit (INDEPENDENT_AMBULATORY_CARE_PROVIDER_SITE_OTHER): Payer: PRIVATE HEALTH INSURANCE | Admitting: *Deleted

## 2011-09-27 DIAGNOSIS — I2699 Other pulmonary embolism without acute cor pulmonale: Secondary | ICD-10-CM

## 2011-09-27 DIAGNOSIS — Z7901 Long term (current) use of anticoagulants: Secondary | ICD-10-CM

## 2011-10-18 ENCOUNTER — Ambulatory Visit (INDEPENDENT_AMBULATORY_CARE_PROVIDER_SITE_OTHER): Payer: PRIVATE HEALTH INSURANCE | Admitting: *Deleted

## 2011-10-18 DIAGNOSIS — I2699 Other pulmonary embolism without acute cor pulmonale: Secondary | ICD-10-CM

## 2011-10-18 DIAGNOSIS — Z7901 Long term (current) use of anticoagulants: Secondary | ICD-10-CM

## 2011-10-18 MED ORDER — ENOXAPARIN SODIUM 100 MG/ML ~~LOC~~ SOLN: 100.0000 mg | Freq: Two times a day (BID) | SUBCUTANEOUS | Status: DC

## 2011-10-18 NOTE — Patient Instructions (Addendum)
5/22 last dose of coumadin 5/23 no coumadin or lovenox 5/24-5/26 lovenox 100 mg subq 2 times/day--12 hours apart 5/27 no lovenox or coumadin per GI surgeon, [facility]---[phone] Will be admitted with surgery scheduled for 5/29, following hospital d/c instructions  This information sent to Dr Felicity Coyer via [facility] in basket today 3:41pm This information faxed to Dr Epifania Gore [phone]

## 2011-11-07 ENCOUNTER — Other Ambulatory Visit: Payer: Self-pay | Admitting: Pharmacist

## 2011-11-07 DIAGNOSIS — I2699 Other pulmonary embolism without acute cor pulmonale: Secondary | ICD-10-CM

## 2011-11-07 DIAGNOSIS — Z7901 Long term (current) use of anticoagulants: Secondary | ICD-10-CM

## 2011-11-07 MED ORDER — ENOXAPARIN SODIUM 100 MG/ML ~~LOC~~ SOLN: 100.0000 mg | Freq: Two times a day (BID) | SUBCUTANEOUS | Status: DC

## 2011-11-13 ENCOUNTER — Ambulatory Visit (INDEPENDENT_AMBULATORY_CARE_PROVIDER_SITE_OTHER): Payer: PRIVATE HEALTH INSURANCE | Admitting: *Deleted

## 2011-11-13 DIAGNOSIS — Z7901 Long term (current) use of anticoagulants: Secondary | ICD-10-CM

## 2011-11-13 DIAGNOSIS — I2699 Other pulmonary embolism without acute cor pulmonale: Secondary | ICD-10-CM

## 2011-11-15 ENCOUNTER — Other Ambulatory Visit: Payer: Self-pay | Admitting: Oncology

## 2011-11-15 ENCOUNTER — Encounter: Payer: Self-pay | Admitting: Oncology

## 2011-11-15 ENCOUNTER — Other Ambulatory Visit (HOSPITAL_BASED_OUTPATIENT_CLINIC_OR_DEPARTMENT_OTHER): Payer: PRIVATE HEALTH INSURANCE | Admitting: Lab

## 2011-11-15 DIAGNOSIS — D509 Iron deficiency anemia, unspecified: Secondary | ICD-10-CM

## 2011-11-15 DIAGNOSIS — I2699 Other pulmonary embolism without acute cor pulmonale: Secondary | ICD-10-CM

## 2011-11-15 LAB — FERRITIN: Ferritin: 23 ng/mL (ref 10–291)

## 2011-11-15 LAB — CBC WITH DIFFERENTIAL/PLATELET
BASO%: 0.6 % (ref 0.0–2.0)
Basophils Absolute: 0 10*3/uL (ref 0.0–0.1)
HCT: 36.8 % (ref 34.8–46.6)
HGB: 12.2 g/dL (ref 11.6–15.9)
LYMPH%: 32.3 % (ref 14.0–49.7)
MCHC: 33.3 g/dL (ref 31.5–36.0)
MONO#: 0.2 10*3/uL (ref 0.1–0.9)
NEUT%: 61.4 % (ref 38.4–76.8)
Platelets: 288 10*3/uL (ref 145–400)
WBC: 5.8 10*3/uL (ref 3.9–10.3)
lymph#: 1.9 10*3/uL (ref 0.9–3.3)

## 2011-11-15 LAB — IRON AND TIBC
%SAT: 7 % — ABNORMAL LOW (ref 20–55)
TIBC: 438 ug/dL (ref 250–470)

## 2011-11-15 NOTE — Progress Notes (Unsigned)
Ferritin was 23 on 11/15/2011 and has remained low despite oral iron. Patient previously had iron deficiency anemia.  We will schedule her for IV Feraheme 510 mg per dose to be given on 11/20/2011 and 11/27/2011.  She has an appointment to see me in mid August.

## 2011-11-18 ENCOUNTER — Other Ambulatory Visit: Payer: Self-pay

## 2011-11-18 ENCOUNTER — Telehealth: Payer: Self-pay

## 2011-11-18 NOTE — Telephone Encounter (Signed)
S/w pt that DSM wants her to get 2 doses of feraheme on 6/19 and 6/26, she stated those dates are OK. We will get back with her with the times.

## 2011-11-19 ENCOUNTER — Telehealth: Payer: Self-pay

## 2011-11-19 NOTE — Telephone Encounter (Signed)
lvm with appts for feraheme

## 2011-11-20 ENCOUNTER — Ambulatory Visit (HOSPITAL_BASED_OUTPATIENT_CLINIC_OR_DEPARTMENT_OTHER): Payer: PRIVATE HEALTH INSURANCE

## 2011-11-20 VITALS — BP 110/75 | HR 75 | Temp 97.0°F

## 2011-11-20 DIAGNOSIS — D509 Iron deficiency anemia, unspecified: Secondary | ICD-10-CM

## 2011-11-20 MED ORDER — FERUMOXYTOL INJECTION 510 MG/17 ML
510.0000 mg | Freq: Once | INTRAVENOUS | Status: AC
  Administered 2011-11-20: 510 mg via INTRAVENOUS
  Filled 2011-11-20: qty 17

## 2011-11-20 MED ORDER — SODIUM CHLORIDE 0.9 % IV SOLN
Freq: Once | INTRAVENOUS | Status: AC
  Administered 2011-11-20: 11:00:00 via INTRAVENOUS

## 2011-11-27 ENCOUNTER — Ambulatory Visit (HOSPITAL_BASED_OUTPATIENT_CLINIC_OR_DEPARTMENT_OTHER): Payer: PRIVATE HEALTH INSURANCE

## 2011-11-27 VITALS — BP 117/72 | HR 71 | Temp 98.1°F

## 2011-11-27 DIAGNOSIS — D509 Iron deficiency anemia, unspecified: Secondary | ICD-10-CM

## 2011-11-27 MED ORDER — FERUMOXYTOL INJECTION 510 MG/17 ML
510.0000 mg | Freq: Once | INTRAVENOUS | Status: AC
  Administered 2011-11-27: 510 mg via INTRAVENOUS
  Filled 2011-11-27: qty 17

## 2011-11-27 MED ORDER — SODIUM CHLORIDE 0.9 % IV SOLN
Freq: Once | INTRAVENOUS | Status: AC
  Administered 2011-11-27: 10:00:00 via INTRAVENOUS

## 2011-11-27 NOTE — Patient Instructions (Signed)
Ferumoxytol injection What is this medicine? FERUMOXYTOL is an iron complex. Iron is used to make healthy red blood cells, which carry oxygen and nutrients throughout the body. This medicine is used to treat iron deficiency anemia in people with chronic kidney disease. This medicine may be used for other purposes; ask your health care provider or pharmacist if you have questions. What should I tell my health care provider before I take this medicine? They need to know if you have any of these conditions: -anemia not caused by low iron levels -high levels of iron in the blood -magnetic resonance imaging (MRI) test scheduled -an unusual or allergic reaction to iron, other medicines, foods, dyes, or preservatives -pregnant or trying to get pregnant -breast-feeding How should I use this medicine? This medicine is for infusion into a vein. It is given by a health care professional in a hospital or clinic setting. Talk to your pediatrician regarding the use of this medicine in children. Special care may be needed. Overdosage: If you think you've taken too much of this medicine contact a poison control center or emergency room at once. Overdosage: If you think you have taken too much of this medicine contact a poison control center or emergency room at once. NOTE: This medicine is only for you. Do not share this medicine with others. What if I miss a dose? It is important not to miss your dose. Call your doctor or health care professional if you are unable to keep an appointment. What may interact with this medicine? This medicine may interact with the following medications: -other iron products This list may not describe all possible interactions. Give your health care provider a list of all the medicines, herbs, non-prescription drugs, or dietary supplements you use. Also tell them if you smoke, drink alcohol, or use illegal drugs. Some items may interact with your medicine. What should I watch  for while using this medicine? Visit your doctor or healthcare professional regularly. Tell your doctor or healthcare professional if your symptoms do not start to get better or if they get worse. You may need blood work done while you are taking this medicine. You may need to follow a special diet. Talk to your doctor. Foods that contain iron include: whole grains/cereals, dried fruits, beans, or peas, leafy green vegetables, and organ meats (liver, kidney). What side effects may I notice from receiving this medicine? Side effects that you should report to your doctor or health care professional as soon as possible: -allergic reactions like skin rash, itching or hives, swelling of the face, lips, or tongue -breathing problems -changes in blood pressure -feeling faint or lightheaded, falls -fever or chills -flushing, sweating, or hot feelings -swelling of the ankles or feet Side effects that usually do not require medical attention (Report these to your doctor or health care professional if they continue or are bothersome.): -diarrhea -headache -nausea, vomiting -stomach pain This list may not describe all possible side effects. Call your doctor for medical advice about side effects. You may report side effects to FDA at 1-800-FDA-1088. Where should I keep my medicine? This drug is given in a hospital or clinic and will not be stored at home. NOTE: This sheet is a summary. It may not cover all possible information. If you have questions about this medicine, talk to your doctor, pharmacist, or health care provider.  2012, Elsevier/Gold Standard. (02/10/2008 9:48:25 PM) 

## 2011-11-29 ENCOUNTER — Encounter (HOSPITAL_COMMUNITY): Payer: Self-pay | Admitting: *Deleted

## 2011-11-29 ENCOUNTER — Emergency Department (HOSPITAL_COMMUNITY)
Admission: EM | Admit: 2011-11-29 | Discharge: 2011-11-29 | Disposition: A | Discharge status: Home / Self Care | Payer: PRIVATE HEALTH INSURANCE | Source: Home / Self Care | Attending: Emergency Medicine | Admitting: Emergency Medicine

## 2011-11-29 DIAGNOSIS — L600 Ingrowing nail: Secondary | ICD-10-CM

## 2011-11-29 MED ORDER — CEPHALEXIN 500 MG PO CAPS: 500.0000 mg | ORAL_CAPSULE | Freq: Three times a day (TID) | ORAL | Status: AC

## 2011-11-29 MED ORDER — TRAMADOL HCL 50 MG PO TABS: 100.0000 mg | ORAL_TABLET | Freq: Three times a day (TID) | ORAL | Status: AC | PRN

## 2011-11-29 NOTE — Discharge Instructions (Signed)

## 2011-11-29 NOTE — ED Notes (Signed)
Reports hx of left ingrown toenail since 1980's when she had a procedure done - since then, she has recurring ingrown toenails that she normally takes care of herself; pt has been recovering from major surgery, and has been unable to bend over well to take care of it this time.  Noticed pain and swelling to left toe approx 4 days ago.  Has been performing Epsom salt soaks QID, and using tea tree oil and Iodine, all of which have stopped the drainage.

## 2011-11-29 NOTE — ED Provider Notes (Signed)
Chief Complaint  Patient presents with  . Ingrown Toenail    History of Present Illness:   [name] has an ingrown toenail over the left lateral nail fold of the left great toe. This is painful, there's not much swelling, no purulent drainage, no granulation tissue. This goes back to the 1980s. She had an ablation procedure done at that time. It never completely healed up thereafter. She's been troubled by it ever since but much more so in the last couple weeks. She is recovering from major surgery and has been unable to bend over to take care of her nails. She denies any fever or chills.  Review of Systems:  Other than noted above, the patient denies any of the following symptoms: Systemic:  No fevers, chills, sweats, or aches.  No fatigue or tiredness. Musculoskeletal:  No joint pain, arthritis, bursitis, swelling, back pain, or neck pain. Neurological:  No muscular weakness, paresthesias, headache, or trouble with speech or coordination.  No dizziness.   PMFSH:  Past medical history, family history, social history, meds, and allergies were reviewed.  Physical Exam:   Vital signs:  BP 127/85  Pulse 92  Temp 98.4 F (36.9 C) (Oral)  Resp 20  SpO2 99%  LMP 11/24/2011 Gen:  Alert and oriented times 3.  In no distress. Musculoskeletal: She has an ingrown toenail of the left, lateral great toe. This is tender to palpation. There is no erythema, no swelling, no pus, no granulation tissue. Otherwise, all joints had a full a ROM with no swelling, bruising or deformity.  No edema, pulses full. Extremities were warm and pink.  Capillary refill was brisk.  Skin:  Clear, warm and dry.  No rash. Neuro:  Alert and oriented times 3.  Muscle strength was normal.  Sensation was intact to light touch.     Procedure Note:  Verbal informed consent was obtained from the patient.  Risks and benefits were outlined with the patient.  Patient understands and accepts these risks.  Identity of the patient was  confirmed verbally and by armband.    Procedure was performed as followed:  The toe was prepped with Betadine and alcohol and anesthetized with a digital block with 2% Xylocaine without epinephrine, 5 mL in total were used. After satisfactory local anesthesia was obtained, the ingrown portion of the nail was split down to the base and the ingrown portion was then avulsed. Bleeding was stopped with direct pressure, antibiotic ointment and a sterile, dry dressing were applied. The patient was instructed in wound care.  Patient tolerated the procedure well without any immediate complications.   Assessment:  The encounter diagnosis was Ingrown toenail.  Plan:   1.  The following meds were prescribed:   New Prescriptions   CEPHALEXIN (KEFLEX) 500 MG CAPSULE    Take 1 capsule (500 mg total) by mouth 3 (three) times daily.   TRAMADOL (ULTRAM) 50 MG TABLET    Take 2 tablets (100 mg total) by mouth every 8 (eight) hours as needed for pain.   2.  The patient was instructed in symptomatic care, including rest and activity, elevation, application of ice and compression.  Appropriate handouts were given. 3.  The patient was told to return if becoming worse in any way, if no better in 3 or 4 days, and given some red flag symptoms that would indicate earlier return.   4.  The patient was told to follow up if any signs of infection, and if this does recur she was  given the name of Dr. Cristie Hem to followup with.   Reuben Likes, MD 11/29/11 2125

## 2011-12-06 ENCOUNTER — Ambulatory Visit (INDEPENDENT_AMBULATORY_CARE_PROVIDER_SITE_OTHER): Payer: PRIVATE HEALTH INSURANCE | Admitting: *Deleted

## 2011-12-06 DIAGNOSIS — I2699 Other pulmonary embolism without acute cor pulmonale: Secondary | ICD-10-CM

## 2011-12-06 DIAGNOSIS — Z7901 Long term (current) use of anticoagulants: Secondary | ICD-10-CM

## 2011-12-06 LAB — POCT INR: INR: 3.9

## 2011-12-17 ENCOUNTER — Other Ambulatory Visit: Payer: Self-pay | Admitting: *Deleted

## 2011-12-17 MED ORDER — WARFARIN SODIUM 5 MG PO TABS: 5.0000 mg | ORAL_TABLET | ORAL | Status: DC

## 2011-12-20 ENCOUNTER — Ambulatory Visit (INDEPENDENT_AMBULATORY_CARE_PROVIDER_SITE_OTHER): Payer: PRIVATE HEALTH INSURANCE

## 2011-12-20 DIAGNOSIS — Z7901 Long term (current) use of anticoagulants: Secondary | ICD-10-CM

## 2011-12-20 DIAGNOSIS — I2699 Other pulmonary embolism without acute cor pulmonale: Secondary | ICD-10-CM

## 2011-12-20 LAB — POCT INR: INR: 2.1

## 2012-01-10 ENCOUNTER — Ambulatory Visit (INDEPENDENT_AMBULATORY_CARE_PROVIDER_SITE_OTHER): Payer: PRIVATE HEALTH INSURANCE | Admitting: *Deleted

## 2012-01-10 DIAGNOSIS — I2699 Other pulmonary embolism without acute cor pulmonale: Secondary | ICD-10-CM

## 2012-01-10 DIAGNOSIS — Z7901 Long term (current) use of anticoagulants: Secondary | ICD-10-CM

## 2012-01-15 ENCOUNTER — Encounter: Payer: Self-pay | Admitting: Internal Medicine

## 2012-01-15 ENCOUNTER — Ambulatory Visit (INDEPENDENT_AMBULATORY_CARE_PROVIDER_SITE_OTHER): Payer: PRIVATE HEALTH INSURANCE | Admitting: Internal Medicine

## 2012-01-15 VITALS — BP 130/92 | HR 98 | Temp 97.4°F | Ht <= 58 in | Wt 216.0 lb

## 2012-01-15 DIAGNOSIS — D259 Leiomyoma of uterus, unspecified: Secondary | ICD-10-CM

## 2012-01-15 DIAGNOSIS — G4733 Obstructive sleep apnea (adult) (pediatric): Secondary | ICD-10-CM

## 2012-01-15 DIAGNOSIS — Z91018 Allergy to other foods: Secondary | ICD-10-CM

## 2012-01-15 DIAGNOSIS — T781XXA Other adverse food reactions, not elsewhere classified, initial encounter: Secondary | ICD-10-CM

## 2012-01-15 DIAGNOSIS — I2699 Other pulmonary embolism without acute cor pulmonale: Secondary | ICD-10-CM

## 2012-01-15 MED ORDER — EPINEPHRINE 0.3 MG/0.3ML IJ DEVI: 0.3000 mg | Freq: Once | INTRAMUSCULAR | Status: DC

## 2012-01-15 NOTE — Assessment & Plan Note (Signed)
recommended CPAP but reports not snoring since gluten-free (2012) follow up pulm as needed and work on weight loss

## 2012-01-15 NOTE — Progress Notes (Signed)
Subjective:    Patient ID: [name], female    DOB: 01/18/1976, 36 y.o.   MRN: [id number]  HPI  Here for follow up - reviewed chronic medical issues:  bilateral PE dx- late 01/2009 -hosp for same 02/2009 at [facility]  prior to that dx been on estrogen tx nuvaring for PCOS - hormones felt to be precipitating factor Also heterozygous for prothrombin II gene mutation in 03/2010= evidence for hypercoag state on prior lab panel  has been following with heme ([facility]inson) - 10/2010 and 01/2011 OV " I should probably never come off coumadin" -also [facility] for coumadin mgmt - no bleeding or bruising problems   obesity - reports weight has decreased since 02/2011 surgery/complications  Follows gluten free diet but no exercise program -  no known complications related to obesity (chol ok, no DM, no arthritis)   OSA - sleep study done 11/2009 - status post pulm eval for same -  Declined CPAP - reports improved on gluten free diet  s/p "take down" colostomy repair 11/2011, successful -had temp colostomy since 03/2011 to correct colocutaneous fistula which developed as complication of myomectomy 02/2011 -  Concerned about allergy to food - gluten specifically  Past Medical History  Diagnosis Date  . OBESITY   . PULMONARY EMBOLISM 02/2009    B PE - chronic anticoag (heme=[facility], [facility])  . 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 POLYCYSTIC OVARIAN DISEASE   . OBSTRUCTIVE SLEEP APNEA   . ANEMIA-IRON DEFICIENCY   . 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 Prothrombin mutation     heterozygous prothrombin II mutation  . Allergy     seasonal allergies  . Anxiety     Review of Systems  Constitutional: Negative for fever. Intentional weight loss Respiratory: Negative for cough and shortness of breath.   Cardiovascular: Negative for chest pain.       Objective:   Physical Exam  BP 130/92  Pulse 98  Temp 97.4 F (36.3 C) (Oral)  Ht 4\' 10"   (1.473 m)  Wt 216 lb (97.977 kg)  BMI 45.14 kg/m2  SpO2 97% Wt Readings from Last 3 Encounters:  01/15/12 216 lb (97.977 kg)  09/12/11 211 lb 6.4 oz (95.89 kg)  09/10/11 208 lb (94.348 kg)   Constitutional: She is obese, short statured. She appears well-developed and well-nourished. No distress.  Neck: Normal range of motion. Neck supple. No JVD present. No thyromegaly present. non tender and no appreciable nodules Cardiovascular: Normal rate, regular rhythm and normal heart sounds.  No murmur heard. chronic 1+ BLE edema. Pulmonary/Chest: Effort normal and breath sounds normal. No respiratory distress. She has no wheezes.  Neurological: She is alert and oriented to person, place, and time. No cranial nerve deficit. Coordination normal. cognition and speech normal Skin: Skin is warm and dry. No rash noted. No erythema.  Psychiatric: She has a normal mood and affect. Her behavior is normal. Judgment and thought content normal.   Lab Results  Component Value Date   WBC 6.9 07/15/2011   HGB 13.0 07/15/2011   HCT 39.2 07/15/2011   PLT 276 07/15/2011   GLUCOSE 102* 07/15/2011   CHOL 135 04/01/2011   TRIG 156* 04/01/2011   HDL 74 02/01/2009   LDLCALC  Value: 82        02/01/2009   ALT 18 07/15/2011   AST 17 07/15/2011   NA 140 07/15/2011   K 3.8 07/15/2011  CL 105 07/15/2011   CREATININE 0.61 07/15/2011   BUN 9 07/15/2011   CO2 23 07/15/2011   TSH 0.450 *Test methodology is 3rd generation TSH* 02/01/2009   INR 1.50* 07/15/2011   HGBA1C  Value: 5.3 02/01/2009     Lab Results  Component Value Date   INR 3.7 01/10/2012   INR 2.1 12/20/2011   INR 3.9 12/06/2011   PROTIME 18.0* 07/15/2011   PROTIME 30.0* 04/07/2009   PROTIME 24.0* 03/31/2009    Assessment & Plan:   See problem list. Medications and labs reviewed today.

## 2012-01-15 NOTE — Assessment & Plan Note (Signed)
Chronic hx, complicated by chronic anticoag Unable to use hormone therapy due to hypercoag state per heme S/p complicated myomectomy 02/2011 Now following with [facility] and considering other mgmt options

## 2012-01-15 NOTE — Patient Instructions (Signed)
It was good to see you today. we'll make referral to allergy specialist. Our office will contact you regarding appointment(s) once made. Use EpiPen in case of emergency - Your prescription(s) have been submitted to your pharmacy. Please take as directed and contact our office if you believe you are having problem(s) with the medication(s). Medications reviewed, no changes at this time.  We have reviewed your prior records including labs and tests today Please schedule followup in 6 months, call sooner if problems.

## 2012-01-15 NOTE — Assessment & Plan Note (Signed)
?  gluten or other foods - Describes angioedema at [facility] 11/2011 requiring Epi Refer to allergy and rx EpiPen to carry prn

## 2012-01-15 NOTE — Assessment & Plan Note (Signed)
B PE dx 01/2009 while on hormone tx- on chronic anticoag indefinitely  heterozygous for prothrombin II mutation (labs 03/2010) = specific hypercoag dz identified Follows with heme and [facility] for same

## 2012-01-17 ENCOUNTER — Encounter: Payer: Self-pay | Admitting: Family

## 2012-01-17 ENCOUNTER — Ambulatory Visit (HOSPITAL_BASED_OUTPATIENT_CLINIC_OR_DEPARTMENT_OTHER): Payer: PRIVATE HEALTH INSURANCE | Admitting: Family

## 2012-01-17 ENCOUNTER — Telehealth: Payer: Self-pay | Admitting: Oncology

## 2012-01-17 ENCOUNTER — Other Ambulatory Visit (HOSPITAL_BASED_OUTPATIENT_CLINIC_OR_DEPARTMENT_OTHER): Payer: PRIVATE HEALTH INSURANCE | Admitting: Lab

## 2012-01-17 VITALS — BP 124/89 | HR 66 | Temp 98.0°F | Resp 20 | Ht <= 58 in | Wt 216.5 lb

## 2012-01-17 DIAGNOSIS — I2699 Other pulmonary embolism without acute cor pulmonale: Secondary | ICD-10-CM

## 2012-01-17 DIAGNOSIS — D509 Iron deficiency anemia, unspecified: Secondary | ICD-10-CM

## 2012-01-17 DIAGNOSIS — Z7901 Long term (current) use of anticoagulants: Secondary | ICD-10-CM

## 2012-01-17 LAB — IRON AND TIBC
%SAT: 20 % (ref 20–55)
Iron: 75 ug/dL (ref 42–145)
UIBC: 295 ug/dL (ref 125–400)

## 2012-01-17 LAB — CBC WITH DIFFERENTIAL/PLATELET
BASO%: 0.3 % (ref 0.0–2.0)
EOS%: 1 % (ref 0.0–7.0)
LYMPH%: 43.2 % (ref 14.0–49.7)
MCH: 28.5 pg (ref 25.1–34.0)
MCHC: 34.8 g/dL (ref 31.5–36.0)
MONO#: 0.3 10*3/uL (ref 0.1–0.9)
MONO%: 5.6 % (ref 0.0–14.0)
Platelets: 270 10*3/uL (ref 145–400)
RBC: 4.84 10*6/uL (ref 3.70–5.45)
WBC: 5.7 10*3/uL (ref 3.9–10.3)
nRBC: 0 % (ref 0–0)

## 2012-01-17 LAB — COMPREHENSIVE METABOLIC PANEL
ALT: 15 U/L (ref 0–35)
AST: 15 U/L (ref 0–37)
Alkaline Phosphatase: 83 U/L (ref 39–117)
Chloride: 101 mEq/L (ref 96–112)
Creatinine, Ser: 0.5 mg/dL (ref 0.50–1.10)
Total Bilirubin: 0.3 mg/dL (ref 0.3–1.2)

## 2012-01-17 LAB — PROTIME-INR: INR: 1.7 — ABNORMAL LOW (ref 2.00–3.50)

## 2012-01-17 NOTE — Patient Instructions (Addendum)
Patient ID: [name],   DOB: January 14, 1976,  MRN: [id number]   [facility] Discharge Instructions  RECOMMENDATIONS MAD BY THE CONSULTANT AND ANY TEST RESULT(S) WILL BE FORWARDED TO YOU REFERRING DOCTOR   EXAM FINDINGS BY NURSE PRACTITIONER TODAY TO REPORT TO THE CLINIC OR PRIMARY PROVIDER:    Your Current Medications Are: Current Outpatient Prescriptions  Medication Sig Dispense Refill  . Charcoal Activated (ACTIVATED CHARCOAL) POWD Take 1 tablet by mouth once a week. As needed      . Docusate Calcium (STOOL SOFTENER PO) Take 100 mg by mouth daily.        Marland Kitchen EPINEPHrine (EPIPEN) 0.3 mg/0.3 mL DEVI Inject 0.3 mLs (0.3 mg total) into the muscle once.  1 Device  0  . ferrous sulfate 325 (65 FE) MG tablet TAKE ONE TABLET BY MOUTH TWICE DAILY  60 tablet  5  . Multiple Vitamins-Minerals (MULTIVITAMIN) tablet Take 1 tablet by mouth daily.      . sodium chloride 0.9 % SOLN 500 mL with iron dextran complex 50 MG/ML SOLN Inject into the vein once.      . warfarin (COUMADIN) 5 MG tablet Take 1 tablet (5 mg total) by mouth as directed. Take 11/2 tabs po daily  60 tablet  3     INSTRUCTIONS GIVEN, DISCUSSED AND FOLLOW-UP:   I acknowledge that I have been informed and understand all the instructions given to me and have received a copy.  I do not have any further questions at this time, but I understand that I may call the [facility] at [phone] during business hours should I have any further questions or need assistance in obtaining follow-up care.   01/17/2012, 3:04 PM

## 2012-01-17 NOTE — Telephone Encounter (Signed)
gv pt appt schedule October and December 2013 and February 2014.

## 2012-01-17 NOTE — Progress Notes (Signed)
Patient ID: [name], female   DOB: February 12, 1976, 36 y.o.   MRN: [id number] CSN: [id number]  CC: Vikki Ports A. Felicity Coyer, MD  [name], M.D.  [name], M.D.  [name], MD    Problem List: [name] is a 36 y.o. Caucasian female with a problem list consisting of:  1. Bilateral pulmonary emboli on 01/31/2009. The patient was on a NuvaRing at that time. Doppler studies of the legs were negative for DVT.  2. 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  3. Polycystic ovary disease.  4. Obesity.  5. Sleep apnea.  6. Uterine fibroids.  7. 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  8. 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  9. Long-term anticoagulation.  [name] was seen by Dr. Arline Asp and I  today for follow-up of her bilateral pulmonary emboli related to heterozygosity for a prothrombin II gene mutation. The patient was on NuvaRing at the time of her pulmonary emboli. Doppler study of the legs at that time were negative. It has been recommended that [name] stay on anticoagulation indefinitely. She was last seen by [place] on 09/12/2011. She is still being followed closely by Dr. Luisa Hart. The patient had her colostomy reversed since the last time that we saw her.  [name] underwent a sigmoidoscopy and upper endoscopy by Dr. Rob Bunting on 09/10/2011. Apparently at 35 cm from the anus, there is a stenotic region of the colon. The scope could not be passed through it. Upper endoscopy was unremarkable. Duodenum was biopsied to check for celiac sprue. The patient started Coumadin on the evening of her procedures, which was Tuesday April 9th.  Her pro times have been followed through the Coumad[facility] Clinic at [facility].   The patient has received to IV iron infusions on June 19th and June 26th.  The patient states that she has not had a craving for ice, tolerated the infusions well and has had an  increase in her energy level. The patient is without any major complaints today although she states that she has been having a sore throat for the last few days.  She denies any SOB, pain with deep breathing/exertion and no unusual bruising or bleeding. She denies any other symptomatology or concerns.    Past Medical History: Past Medical History  Diagnosis Date  . OBESITY   . PULMONARY EMBOLISM 02/2009    B PE - chronic anticoag (heme=murinson, [facility])  . 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 POLYCYSTIC OVARIAN DISEASE   . OBSTRUCTIVE SLEEP APNEA   . ANEMIA-IRON DEFICIENCY   . 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 Prothrombin mutation     heterozygous prothrombin II mutation  . Allergy     seasonal allergies  . Anxiety     Surgical History: Past Surgical History  Procedure Date  . Myomectomy abdominal approach 1997    No complications  . Myomectomy 02/12/2011    Procedure: MYOMECTOMY;  Surgeon: Turner Daniels, MD;  Location: [facility];  Service: Gynecology;  Laterality: N/A;  . Colostomy 03/2011    Dr. Luisa Hart  . Colostomy reversal     Current Medications: Current Outpatient Prescriptions  Medication Sig Dispense Refill  . Charcoal Activated (ACTIVATED CHARCOAL) POWD Take 1 tablet by mouth once a week. As needed      . Docusate Calcium (STOOL SOFTENER PO) Take 100 mg by  mouth daily.        Marland Kitchen EPINEPHrine (EPIPEN) 0.3 mg/0.3 mL DEVI Inject 0.3 mLs (0.3 mg total) into the muscle once.  1 Device  0  . ferrous sulfate 325 (65 FE) MG tablet TAKE ONE TABLET BY MOUTH TWICE DAILY  60 tablet  5  . Multiple Vitamins-Minerals (MULTIVITAMIN) tablet Take 1 tablet by mouth daily.      . sodium chloride 0.9 % SOLN 500 mL with iron dextran complex 50 MG/ML SOLN Inject into the vein once.      . warfarin (COUMADIN) 5 MG tablet Take 1 tablet (5 mg total) by mouth as directed. Take 11/2 tabs po daily  60 tablet  3    Allergies: Allergies   Allergen Reactions  . Oxycodone Palpitations  . Gluten Other (See Comments)    Mucus in back of throat and choking  . Other     "Chlorascrub"    Family History: Family History  Problem Relation Age of Onset  . Allergies Mother   . Breast cancer Mother   . Asthma Paternal Grandmother   . Emphysema Paternal Grandfather   . Colon cancer Neg Hx   . Diabetes Maternal Grandmother   . Dementia Father     Social History: History  Substance Use Topics  . Smoking status: Never Smoker   . Smokeless tobacco: Never Used  . Alcohol Use: No    Review of Systems: 10 Point review of systems was completed and is negative except as noted above.   Physical Exam:   Blood pressure 124/89, pulse 66, temperature 98 F (36.7 C), temperature source Oral, resp. rate 20, height 4\' 10"  (1.473 m), weight 216 lb 8 oz (98.204 kg).  General appearance: Alert, cooperative, well nourished, obese, no apparent distress Head: Normocephalic, without obvious abnormality, atraumatic Eyes: Conjunctivae/corneas clear, PERRLA, EOMI Throat: Lips, mucosa, tongue, teeth and gums are normal; some erythema noted in uvula area suggesting possible pharyngitis Resp: Clear to auscultation bilaterally, diminished bibasilar breath sounds Cardio: Regular rate and rhythm, S1, S2 normal, no murmur, click, rub or gallop GI: Soft, non-tender, distended, excessive habitus, hypoactive bowel sounds Extremities: Extremities normal, atraumatic, no cyanosis or edema Lymph nodes: Cervical, supraclavicular, and axillary nodes normal Neurologic: Grossly normal   Laboratory Data: Results for orders placed in visit on 01/17/12 (from the past 48 hour(s))  CBC WITH DIFFERENTIAL     Status: Abnormal   Collection Time   01/17/12  1:43 PM      Component Value Range Comment   WBC 5.7  3.9 - 10.3 10e3/uL    NEUT# 2.9  1.5 - 6.5 10e3/uL    HGB 13.8  11.6 - 15.9 g/dL    HCT 16.1  09.6 - 04.5 %    Platelets 270  145 - 400 10e3/uL    MCV  81.8  79.5 - 101.0 fL    MCH 28.5  25.1 - 34.0 pg    MCHC 34.8  31.5 - 36.0 g/dL    RBC 4.09  8.11 - 9.14 10e6/uL    RDW 15.6 (*) 11.2 - 14.5 %    lymph# 2.5  0.9 - 3.3 10e3/uL    MONO# 0.3  0.1 - 0.9 10e3/uL    Eosinophils Absolute 0.1  0.0 - 0.5 10e3/uL    Basophils Absolute 0.0  0.0 - 0.1 10e3/uL    NEUT% 49.9  38.4 - 76.8 %    LYMPH% 43.2  14.0 - 49.7 %    MONO% 5.6  0.0 -  14.0 %    EOS% 1.0  0.0 - 7.0 %    BASO% 0.3  0.0 - 2.0 %    nRBC 0  0 - 0 %   PROTIME-INR     Status: Abnormal   Collection Time   01/17/12  1:43 PM      Component Value Range Comment   Protime 20.4 (*) 10.6 - 13.4 Seconds    INR 1.70 (*) 2.00 - 3.50    Lovenox No        Imaging Studies: 1. CT scan of the abdomen and pelvis with IV contrast carried out on 02/20/2011 showed abscess collection identified superior to the uterus extending along the left lateral aspect, 10.2 x 4.2 x 5 cm in size, appearing to extend into the left lateral aspect of the uterus and questionably site of myomectomy. There was additional gas collection seen in the anterior abdominal wall fascial planes.  There was rounded focal fluid collection with enhancing wall in the region of the left adnexa, possibly an ovarian cyst. There was an  open ventral wound anterior abdominal wall of pelvis.  2. CT scan of the abdomen and pelvis with IV contrast on 03/12/2011 showed persistent fistulous tract from the sigmoid colon to the anterior abdominal wall, slight interval decrease in the size of the abscess just superior to the uterus, and stable surgical  changes involving the uterus.  3. CT scan of the abdomen and pelvis without IV contrast on 05/09/2011 showed diverting loop colostomy in the left lower quadrant with no complicating features. There was a tiny residual colocutaneous fistula. There was minimal residual subcutaneous and anterior abdominal wall inflammation, but no abscess.  4. CT scan of the abdomen and pelvis with IV contrast on  06/27/2011 showed left lower quadrant loop colostomy with no extravasation of contrast, although the proximal sigmoid is suboptimally opacified. The appearance suggested the closure of the fistula that was seen on the study of 05/09/2011.    Procedures:  1. Flexible sigmoidoscopy with submucosal injections on 09/10/2011. At 35 cm from the anus, there was a site of injured colon,  hypertrophied mucosa. Abutting this site directly, the lumen of the colon was quite narrowed and the scope could not be passed  through it. This area was labeled with [place] ink injection.  2. EGD with duodenal biopsies to check for celiac sprue were carried out. The EGD was normal.    Impression/Plan: Jamella seems to be doing well and she has re-established her anticoagulation with Coumadin. She will continue to have her pro  times followed by the [facility]. She  Has a colostomy reversal by a Careers adviser at [facility]. Shawneen is currently taking 1 Ferrous sulfate tablet PO daily and has required intravenous iron infusions to correct iron deficiency anemia. Dr. Arline Asp feels that the circumstances, especially life-threatening pulmonary emboli in association with the heterozygous prothrombin II gene mutation, warrants an indefinite period of anticoagulation. We will plan to check CBC and iron studies every 2 months and Dollie is to return in 4 months.  During her next office visit we will check CBC, chemistries and iron studies.   Revin Corker  NP-C 01/17/2012, 3:17 PM

## 2012-01-24 ENCOUNTER — Ambulatory Visit (INDEPENDENT_AMBULATORY_CARE_PROVIDER_SITE_OTHER): Payer: PRIVATE HEALTH INSURANCE | Admitting: *Deleted

## 2012-01-24 ENCOUNTER — Ambulatory Visit: Payer: PRIVATE HEALTH INSURANCE | Admitting: Internal Medicine

## 2012-01-24 DIAGNOSIS — I2699 Other pulmonary embolism without acute cor pulmonale: Secondary | ICD-10-CM

## 2012-01-24 DIAGNOSIS — Z7901 Long term (current) use of anticoagulants: Secondary | ICD-10-CM

## 2012-01-24 LAB — POCT INR: INR: 2.3

## 2012-02-05 ENCOUNTER — Encounter: Payer: Self-pay | Admitting: Pharmacist

## 2012-02-14 ENCOUNTER — Ambulatory Visit (INDEPENDENT_AMBULATORY_CARE_PROVIDER_SITE_OTHER): Payer: PRIVATE HEALTH INSURANCE | Admitting: *Deleted

## 2012-02-14 DIAGNOSIS — I2699 Other pulmonary embolism without acute cor pulmonale: Secondary | ICD-10-CM

## 2012-02-14 DIAGNOSIS — Z7901 Long term (current) use of anticoagulants: Secondary | ICD-10-CM

## 2012-02-14 LAB — POCT INR: INR: 3.2

## 2012-02-28 ENCOUNTER — Ambulatory Visit (INDEPENDENT_AMBULATORY_CARE_PROVIDER_SITE_OTHER): Payer: PRIVATE HEALTH INSURANCE | Admitting: *Deleted

## 2012-02-28 DIAGNOSIS — I2699 Other pulmonary embolism without acute cor pulmonale: Secondary | ICD-10-CM

## 2012-02-28 DIAGNOSIS — Z7901 Long term (current) use of anticoagulants: Secondary | ICD-10-CM

## 2012-02-28 LAB — POCT INR: INR: 2.5

## 2012-03-06 ENCOUNTER — Other Ambulatory Visit: Payer: PRIVATE HEALTH INSURANCE

## 2012-03-06 ENCOUNTER — Encounter: Payer: Self-pay | Admitting: Internal Medicine

## 2012-03-06 ENCOUNTER — Other Ambulatory Visit: Payer: Self-pay | Admitting: Internal Medicine

## 2012-03-06 ENCOUNTER — Ambulatory Visit (INDEPENDENT_AMBULATORY_CARE_PROVIDER_SITE_OTHER): Payer: PRIVATE HEALTH INSURANCE | Admitting: Internal Medicine

## 2012-03-06 VITALS — BP 118/78 | HR 74 | Ht <= 58 in | Wt 223.6 lb

## 2012-03-06 DIAGNOSIS — J309 Allergic rhinitis, unspecified: Secondary | ICD-10-CM

## 2012-03-06 DIAGNOSIS — K900 Celiac disease: Secondary | ICD-10-CM

## 2012-03-06 DIAGNOSIS — Z91018 Allergy to other foods: Secondary | ICD-10-CM

## 2012-03-06 DIAGNOSIS — T781XXA Other adverse food reactions, not elsewhere classified, initial encounter: Secondary | ICD-10-CM

## 2012-03-06 DIAGNOSIS — J302 Other seasonal allergic rhinitis: Secondary | ICD-10-CM

## 2012-03-06 DIAGNOSIS — G4733 Obstructive sleep apnea (adult) (pediatric): Secondary | ICD-10-CM

## 2012-03-06 NOTE — Progress Notes (Signed)
03/06/12- 36 yo F never smoker referred courtesy of Dr Felicity Coyer for allergy evaluation concerned about food allergy/ wheat/ gluten; Complicated by PCOS, hx PE/ warfarin. She complains that it "hurts to breathe", got worse in May of 2013 when eating gluten "even a trace". She complains that for the past year, thick mucus chokes her throat so she has to sit up to breathe. She went to [employer] where she was told she had gluten intolerance just based on her verbal description with the store clerk. She says avoiding gluten, she feels better. Sweet potato makes her chest tight with shortness of breath-found the serving was mixed with something else. She describes shortness of breath with exertion and anorexia but not rash or abdominal cramps from these foods. Iron replacement for anemia. She has noticed nasal congestion and spring and fall, in the past 2 years. Alcohol makes her "insides feel swollen" and this also applies to cough syrups. She has been diagnosed with rosacea but no other skin rash. Childhood asthma. She did snore-stopped when she quit gluten.NPSG 01/27/10- AHI 8/ hr- mild obstructive apnea. Pulmonary embolism 2010 GYN surgery for fibroids September, 2012 was complicated by intestinal laceration requiring temporary colostomy reversed in May of 2013  Prior to Admission medications   Medication Sig Start Date End Date Taking? Authorizing Provider  Charcoal Activated (ACTIVATED CHARCOAL) POWD Take 1 tablet by mouth once a week. As needed   Yes Historical Provider, MD  EPINEPHrine (EPIPEN) 0.3 mg/0.3 mL DEVI Inject 0.3 mLs (0.3 mg total) into the muscle once. 01/15/12  Yes Newt Lukes, MD  ferrous sulfate 325 (65 FE) MG tablet TAKE ONE TABLET BY MOUTH TWICE DAILY 09/17/11  Yes Newt Lukes, MD  Multiple Vitamins-Minerals (MULTIVITAMIN) tablet Take 1 tablet by mouth daily. 07/26/11 07/25/12 Yes Newt Lukes, MD  sodium chloride 0.9 % SOLN 500 mL with iron dextran complex 50 MG/ML  SOLN Inject into the vein once.   Yes Historical Provider, MD  warfarin (COUMADIN) 5 MG tablet Take 1 tablet (5 mg total) by mouth as directed. Take 11/2 tabs po daily 12/17/11  Yes Newt Lukes, MD   Past Medical History  Diagnosis Date  . OBESITY   . PULMONARY EMBOLISM 02/2009    B PE - chronic anticoag (heme=murinson, [facility])  . 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 POLYCYSTIC OVARIAN DISEASE   . OBSTRUCTIVE SLEEP APNEA   . ANEMIA-IRON DEFICIENCY   . 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 Prothrombin mutation     heterozygous prothrombin II mutation  . Allergy     seasonal allergies  . Anxiety    Past Surgical History  Procedure Date  . Myomectomy abdominal approach 1997    No complications  . Myomectomy 02/12/2011    Procedure: MYOMECTOMY;  Surgeon: Turner Daniels, MD;  Location: [facility];  Service: Gynecology;  Laterality: N/A;  . Colostomy 03/2011    Dr. Luisa Hart  . Colostomy reversal    Family History  Problem Relation Age of Onset  . Allergies Mother   . Breast cancer Mother   . Asthma Paternal Grandmother   . Emphysema Paternal Grandfather   . Colon cancer Neg Hx   . Diabetes Maternal Grandmother   . Dementia Father    History   Social History  . Marital Status: Single    Spouse Name: N/A    Number of Children: 0  . Years  of Education: N/A   Occupational History  . Freelance Photographer    Social History Main Topics  . Smoking status: Never Smoker   . Smokeless tobacco: Never Used  . Alcohol Use: No  . Drug Use: No  . Sexually Active: Not on file   Other Topics Concern  . Not on file   Social History Narrative  . No narrative on file   ROS-see HPI Constitutional:   No-   weight loss, night sweats, fevers, chills, fatigue, lassitude. HEENT:   No-  headaches, difficulty swallowing, tooth/dental problems, sore throat,       No-  sneezing, itching, ear ache, nasal congestion, post nasal  drip,  CV:  No- anginal  chest pain, orthopnea, PND, swelling in lower extremities, anasarca, dizziness, palpitations Resp: +  shortness of breath with exertion or at rest.              No-   productive cough,  No non-productive cough,  No- coughing up of blood.              No-   change in color of mucus.  No- wheezing.   Skin: No-   rash or lesions. GI:  No-   heartburn, indigestion, abdominal pain, nausea, vomiting, diarrhea,                 change in bowel habits, +loss of appetite GU: No-   dysuria, change in color of urine, no urgency or frequency.  No- flank pain. MS:  No-   joint pain or swelling.  No- decreased range of motion.  No- back pain. Neuro-     nothing unusual Psych:  No- change in mood or affect. No depression or anxiety.  No memory loss.  OBJ- Physical Exam General- Alert, Oriented, Affect-appropriate, Distress- none acute. Obese Skin- rash-none, lesions- none, excoriation- none Lymphadenopathy- none Head- atraumatic            Eyes- Gross vision intact, PERRLA, conjunctivae and secretions clear            Ears- Hearing, canals-normal            Nose- Clear, no-Septal dev, mucus, polyps, erosion, perforation             Throat- Mallampati II-III , mucosa clear , drainage- none, tonsils- atrophic Neck- flexible , trachea midline, no stridor , thyroid nl, carotid no bruit Chest - symmetrical excursion , unlabored           Heart/CV- RRR , no murmur , no gallop  , no rub, nl s1 s2                           - JVD- none , edema- none, stasis changes- none, varices- none           Lung- clear to P&A, wheeze- none, cough- none , dullness-none, rub- none           Chest wall-  Abd- tender-no, distended-no, bowel sounds-present, HSM- no Br/ Gen/ Rectal- Not done, not indicated Extrem- cyanosis- none, clubbing, none, atrophy- none, strength- nl Neuro- grossly intact to observation

## 2012-03-06 NOTE — Patient Instructions (Addendum)
Order lab- Allergy and Food Allergy IgE profiles              Dx food allergy   Celiac disease  Allergic rhinitis                  Celiac antibody  8002                  Celiac DNA  [id number]                  Wheat IgE   81209                  Wheat IgG   [id number]

## 2012-03-09 LAB — GLIA (IGA/G) + TTG IGA: Gliadin IgA: 4.5 U/mL (ref <20)

## 2012-03-10 ENCOUNTER — Ambulatory Visit: Payer: Self-pay | Admitting: Pharmacist

## 2012-03-10 DIAGNOSIS — I2699 Other pulmonary embolism without acute cor pulmonale: Secondary | ICD-10-CM

## 2012-03-10 DIAGNOSIS — Z7901 Long term (current) use of anticoagulants: Secondary | ICD-10-CM

## 2012-03-10 LAB — ALLERGY FULL AND FOOD SPECIFIC PROFILE
Allergen,Goose feathers, e70: <0.1 kU/L
Alternaria Alternata: <0.1 kU/L
Apple: <0.1 kU/L
Aspergillus fumigatus, IgG: <0.1 kU/L
Bahia Grass: <0.1 kU/L
Box Elder IgE: <0.1 kU/L
Cat Dander: <0.1 kU/L
Common Ragweed: <0.1 kU/L
D. farinae: <0.1 kU/L
Dog Dander: <0.1 kU/L
Egg White IgE: <0.1 kU/L
Fish Cod: <0.1 kU/L
Goldenrod: <0.1 kU/L
Helminthosporium halodes: <0.1 kU/L
House Dust Hollister: <0.1 kU/L
IgE (Immunoglobulin E), Serum: <1.5 IU/mL (ref 0.0–180.0)
Lamb's Quarters: <0.1 kU/L
Peanut IgE: <0.1 kU/L
Plantain: <0.1 kU/L
Shrimp IgE: <0.1 kU/L
Stemphylium Botryosum: <0.1 kU/L
Sycamore Tree: <0.1 kU/L
Tomato IgE: <0.1 kU/L
Tuna IgE: <0.1 kU/L
Wheat IgE: <0.1 kU/L

## 2012-03-12 NOTE — Progress Notes (Signed)
Quick Note:  Pt aware of results. ______ 

## 2012-03-15 DIAGNOSIS — J302 Other seasonal allergic rhinitis: Secondary | ICD-10-CM | POA: Insufficient documentation

## 2012-03-15 NOTE — Assessment & Plan Note (Signed)
Spring and fall associated nasal congestion. Antihistamines are probably enough for now.

## 2012-03-15 NOTE — Assessment & Plan Note (Signed)
She reports snoring stopped when she went to gluten-free.

## 2012-03-15 NOTE — Assessment & Plan Note (Signed)
She is particularly seeking confirmation that she is gluten sensitive. She does not describe wheezing, rash or immediate GI symptoms associated with foods. She reports feeling better since trying to be gluten-free. Iron deficiency anemia can be associated with celiac disease syndromes. Plan-celiac panel, allergy profile, IgE and IgG antibody for wheat

## 2012-03-20 ENCOUNTER — Other Ambulatory Visit (HOSPITAL_BASED_OUTPATIENT_CLINIC_OR_DEPARTMENT_OTHER): Payer: PRIVATE HEALTH INSURANCE | Admitting: Lab

## 2012-03-20 DIAGNOSIS — D509 Iron deficiency anemia, unspecified: Secondary | ICD-10-CM

## 2012-03-20 DIAGNOSIS — I2699 Other pulmonary embolism without acute cor pulmonale: Secondary | ICD-10-CM

## 2012-03-20 LAB — CBC WITH DIFFERENTIAL/PLATELET
Eosinophils Absolute: 0.1 10*3/uL (ref 0.0–0.5)
HGB: 13.4 g/dL (ref 11.6–15.9)
MONO#: 0.3 10*3/uL (ref 0.1–0.9)
NEUT#: 3.8 10*3/uL (ref 1.5–6.5)
Platelets: 259 10*3/uL (ref 145–400)
RBC: 4.48 10*6/uL (ref 3.70–5.45)
RDW: 12.6 % (ref 11.2–14.5)
WBC: 6.4 10*3/uL (ref 3.9–10.3)

## 2012-03-20 LAB — FERRITIN: Ferritin: 252 ng/mL (ref 10–291)

## 2012-04-06 ENCOUNTER — Ambulatory Visit (INDEPENDENT_AMBULATORY_CARE_PROVIDER_SITE_OTHER): Payer: PRIVATE HEALTH INSURANCE | Admitting: Internal Medicine

## 2012-04-06 ENCOUNTER — Encounter: Payer: Self-pay | Admitting: Internal Medicine

## 2012-04-06 VITALS — BP 124/82 | HR 72 | Ht <= 58 in | Wt 228.6 lb

## 2012-04-06 DIAGNOSIS — T781XXA Other adverse food reactions, not elsewhere classified, initial encounter: Secondary | ICD-10-CM

## 2012-04-06 DIAGNOSIS — T7819XA Other adverse food reactions, not elsewhere classified, initial encounter: Secondary | ICD-10-CM

## 2012-04-06 DIAGNOSIS — Z91018 Allergy to other foods: Secondary | ICD-10-CM

## 2012-04-06 NOTE — Progress Notes (Signed)
03/06/12- 36 yo F never smoker referred courtesy of Dr Felicity Coyer for allergy evaluation concerned about food allergy/ wheat/ gluten; Complicated by PCOS, hx PE/ warfarin. She complains that it "hurts to breathe", got worse in May of 2013 when eating gluten "even a trace". She complains that for the past year, thick mucus chokes her throat so she has to sit up to breathe. She went to [employer] where she was told she had gluten intolerance just based on her verbal description with the store clerk. She says avoiding gluten, she feels better. Sweet potato makes her chest tight with shortness of breath-found the serving was mixed with something else. She describes shortness of breath with exertion and anorexia but not rash or abdominal cramps from these foods. Iron replacement for anemia. She has noticed nasal congestion and spring and fall, in the past 2 years. Alcohol makes her "insides feel swollen" and this also applies to cough syrups. She has been diagnosed with rosacea but no other skin rash. Childhood asthma. She did snore-stopped when she quit gluten.NPSG 01/27/10- AHI 38/ hr- mild obstructive apnea. Pulmonary embolism 2010 GYN surgery for fibroids September, 2012 was complicated by intestinal laceration requiring temporary colostomy reversed in May of 2013  04/06/12- 36 yo F never smoker referred courtesy of Dr Felicity Coyer for allergy evaluation concerned about food allergy/ wheat/ gluten; Complicated by PCOS, hx PE/ warfarin. Had flu vaccine. No events since last here. No longer hurts to breathe. She credits all improvements to continue avoidance of gluten. Labs-03/10/2012-all negative/normal. Total IgE less than 1.5 with no specific elevations, celiac panel all negative.  ROS-see HPI Constitutional:   No-   weight loss, night sweats, fevers, chills, fatigue, lassitude. HEENT:   No-  headaches, difficulty swallowing, tooth/dental problems, sore throat,       No-  sneezing, itching, ear ache, nasal  congestion, post nasal drip,  CV:  No- anginal  chest pain, orthopnea, PND, swelling in lower extremities, anasarca, dizziness, palpitations Resp: +  shortness of breath with exertion or at rest.              No-   productive cough,  No non-productive cough,  No- coughing up of blood.              No-   change in color of mucus.  No- wheezing.   Skin: No-   rash or lesions. GI:  No-   heartburn, indigestion, abdominal pain, nausea, vomiting, GU: . MS:  No-   joint pain or swelling.  . Neuro-     nothing unusual Psych:  No- change in mood or affect. No depression or anxiety.  No memory loss.  OBJ- Physical Exam General- Alert, Oriented, Affect-appropriate, Distress- none acute. Obese Skin- rash-none, lesions- none, excoriation- none Lymphadenopathy- none Head- atraumatic            Eyes- Gross vision intact, PERRLA, conjunctivae and secretions clear            Ears- Hearing, canals-normal            Nose- Clear, no-Septal dev, mucus, polyps, erosion, perforation             Throat- Mallampati II-III , mucosa clear , drainage- none, tonsils- atrophic Neck- flexible , trachea midline, no stridor , thyroid nl, carotid no bruit Chest - symmetrical excursion , unlabored           Heart/CV- RRR , no murmur , no gallop  , no rub, nl s1 s2                           -  JVD- none , edema- none, stasis changes- none, varices- none           Lung- clear to P&A, wheeze- none, cough- none , dullness-none, rub- none           Chest wall-  Abd-  Br/ Gen/ Rectal- Not done, not indicated Extrem- cyanosis- none, clubbing, none, atrophy- none, strength- nl Neuro- grossly intact to observation     

## 2012-04-06 NOTE — Patient Instructions (Addendum)
I can see you again as needed. Suggest you try gradually liberalizing your diet, because it isn't evident from these tests that you have a food allergy or celiac.  Avoid foods that clearly bother you.

## 2012-04-16 NOTE — Assessment & Plan Note (Signed)
I can't document any allergy process to explain her symptoms. Plan-She'll manage her diet as seems best to her. I suggested she gradually add back foods she is unsure of , avoiding foods that clearly cause symptoms.

## 2012-04-17 IMAGING — CT CT ABD-PELV W/ CM
1 series · 15 of 32 positions shown, 19 images · IV contrast ([ID] [id number])
Comparison: CT scan 02/27/2011.

CLINICAL DATA: Pelvic abscesses.

CT ABDOMEN AND PELVIS WITH CONTRAST
TECHNIQUE: Multidetector CT imaging of the abdomen and pelvis was
performed following the standard protocol during bolus
administration of intravenous contrast.
Contrast: 100mL OMNIPAQUE IOHEXOL 300 MG/ML IV SOLN

[Series 2: rtn ap with st · axial · 0.83mm/px · z∈[-396,+18]mm · 15 of 92 slices shown, 19 images]
[im 6/92  soft-tissue]
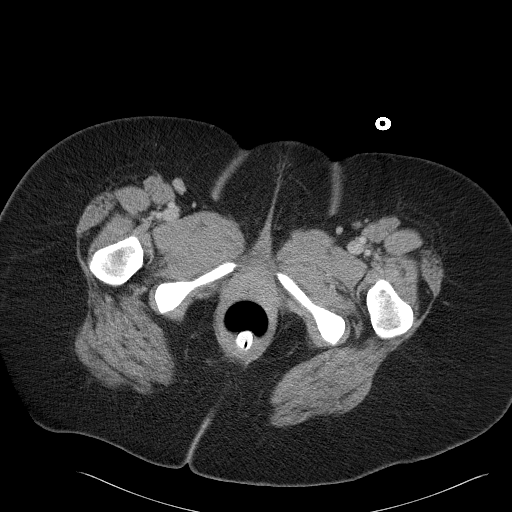
[im 6/92  bone]
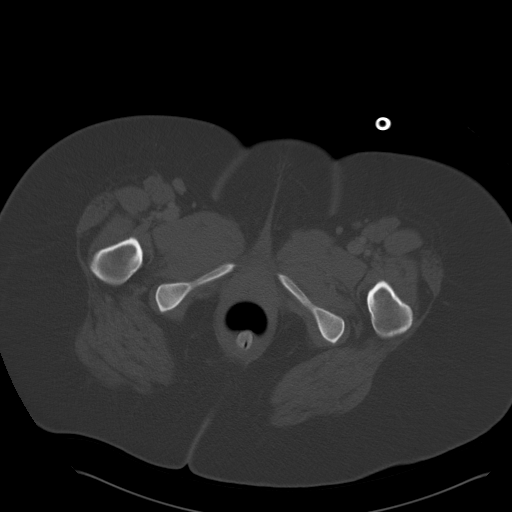
[im 12/92  soft-tissue]
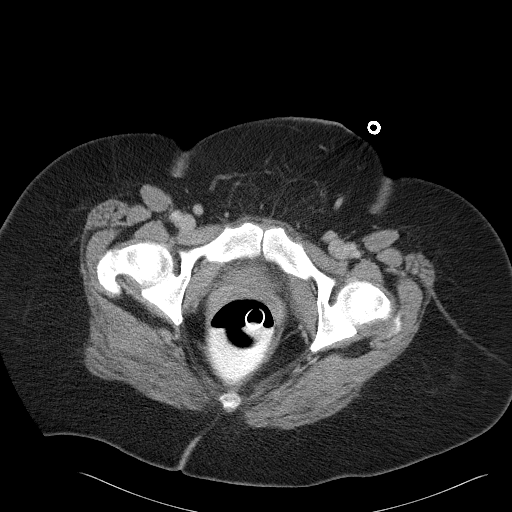
[im 18/92  soft-tissue]
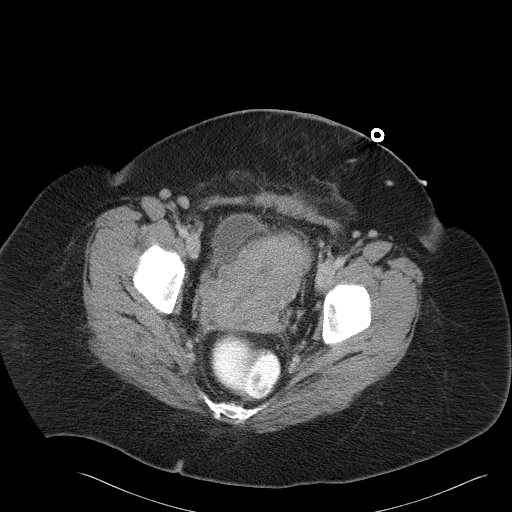
[im 27/92  soft-tissue]
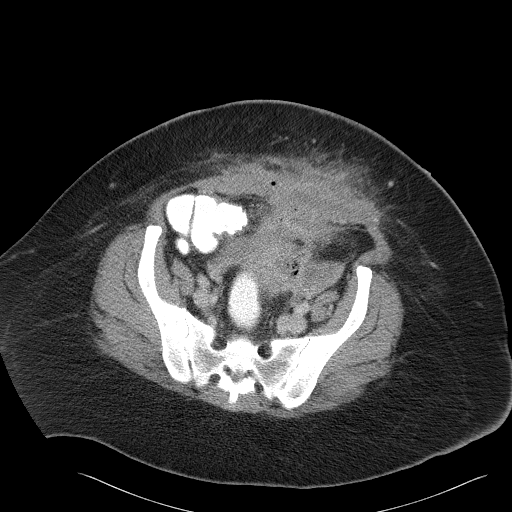
[im 33/92  soft-tissue]
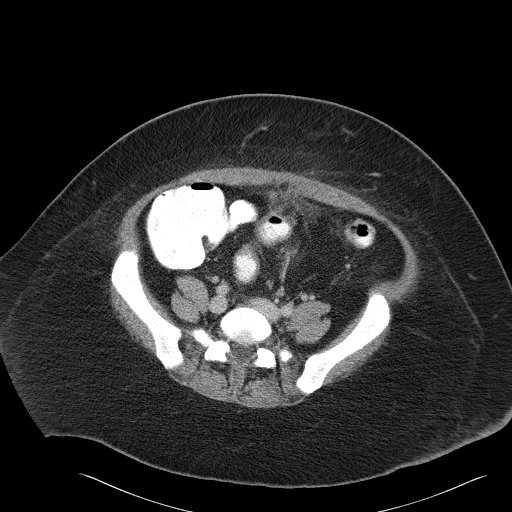
[im 39/92  soft-tissue]
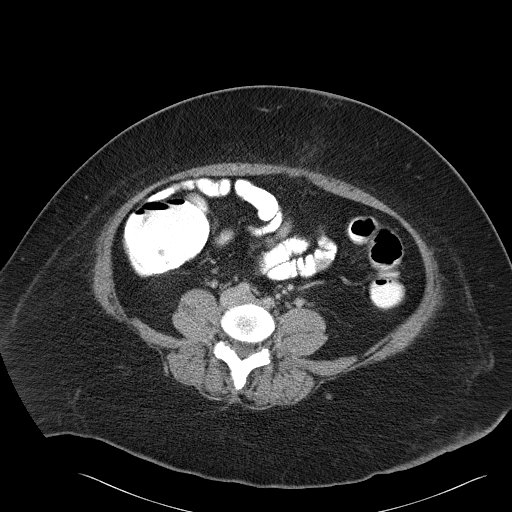
[im 47/92  soft-tissue]
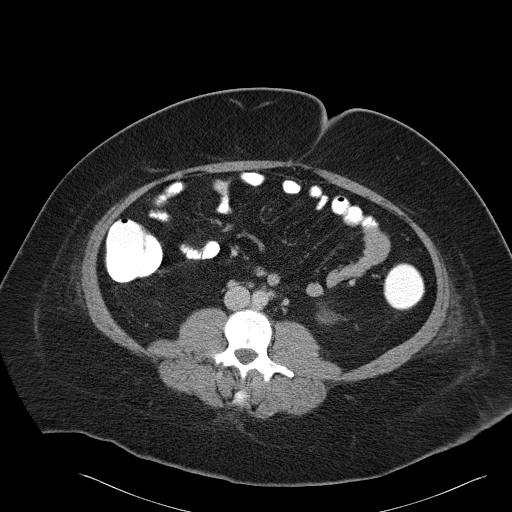
[im 53/92  soft-tissue]
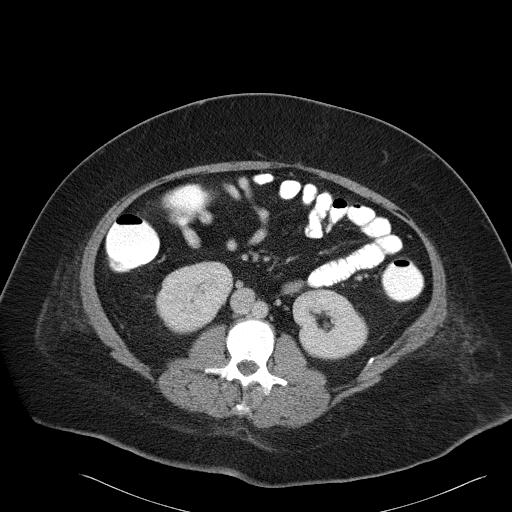
[im 59/92  soft-tissue]
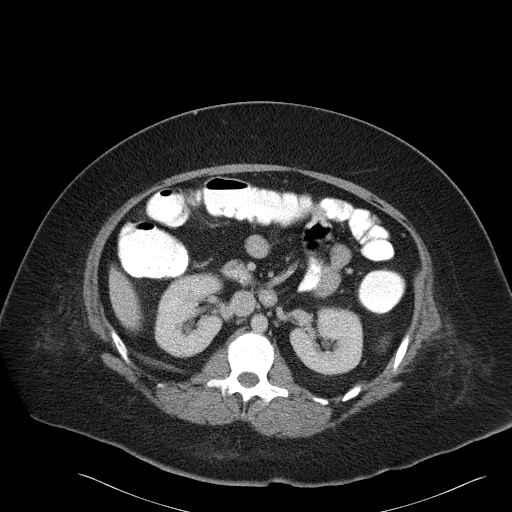
[im 59/92  bone]
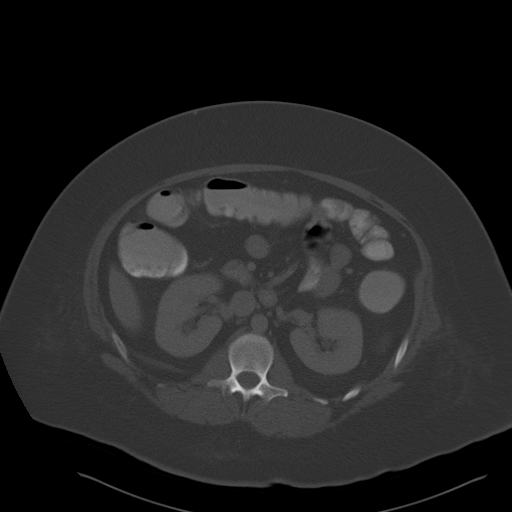
[im 65/92  soft-tissue]
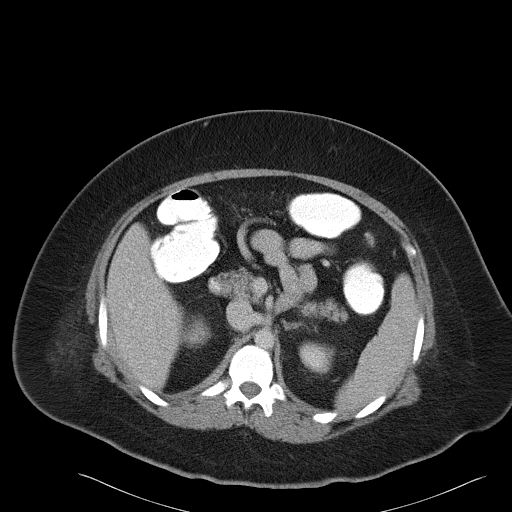
[im 74/92  soft-tissue]
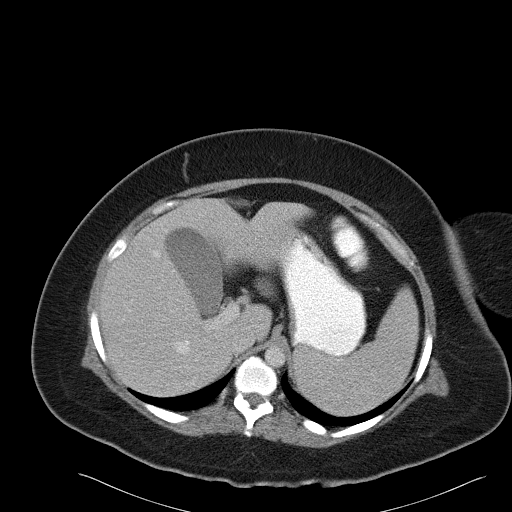
[im 80/92  soft-tissue]
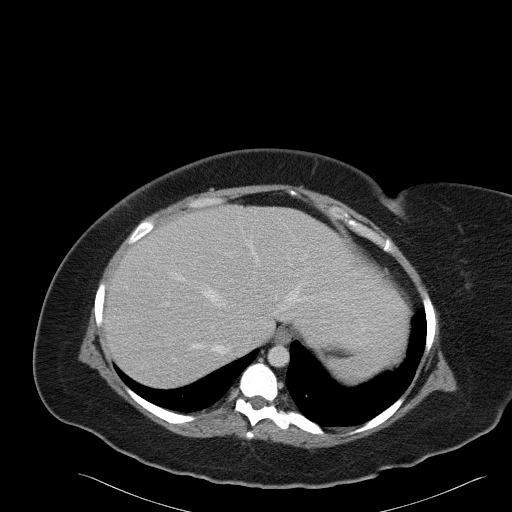
[im 80/92  lung]
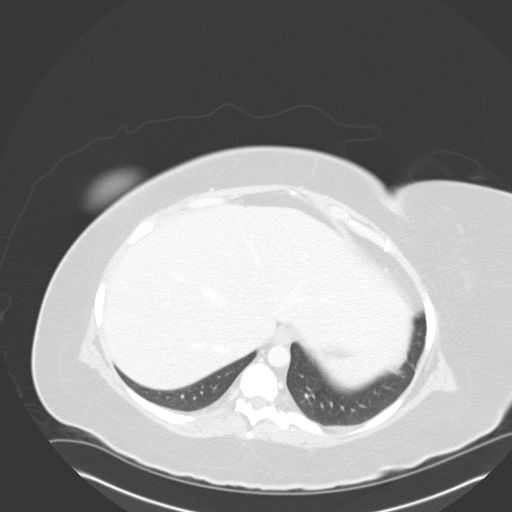
[im 83/92  lung]
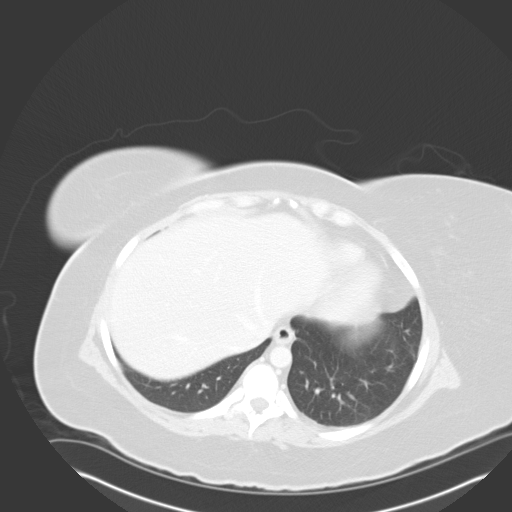
[im 86/92  soft-tissue]
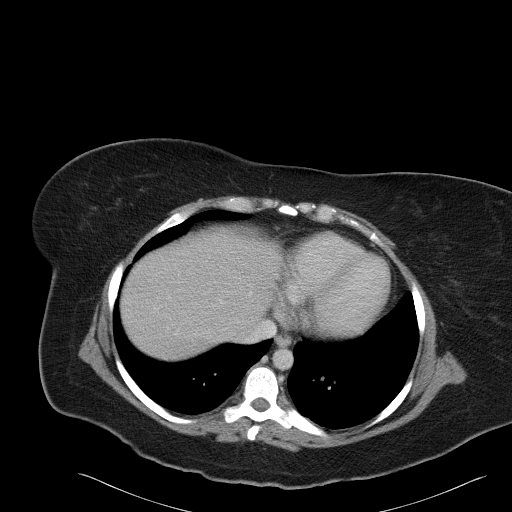
[im 86/92  lung]
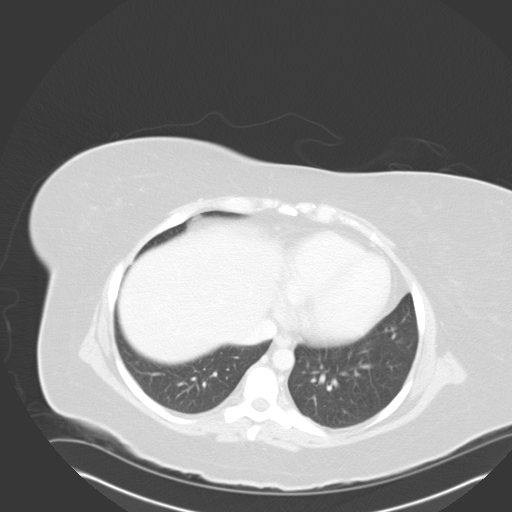
[im 89/92  lung]
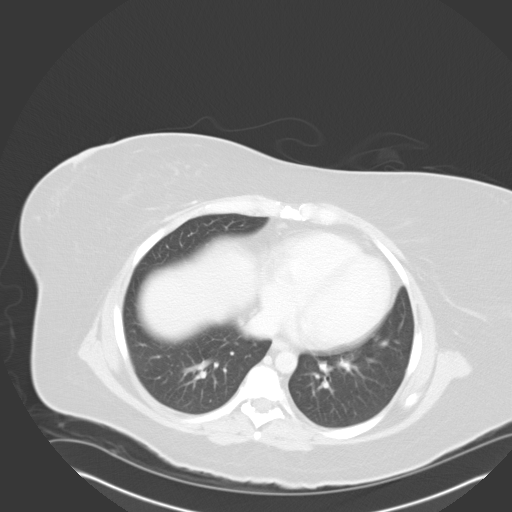

[15 of 32 positions shown; findings below may reference images not displayed]

FINDINGS: The drainage catheter in the subcutaneous fat overlying
the lower anterior abdominal wall is unchanged.  There is a
persistent fistulous tract leading from the upper sigmoid colon to
the anterior abdominal wall with leaking rectal contrast.  There is
also a persistent posterior directed abscess which is just superior
to the uterus.  This previously measured 5.2 x 2.8 cm now measures
4.4 x 2.1 cm.  The bladder is unremarkable.  There are surgical
change involving the uterus of previous myomectomy.  There are
cysts associated with both ovaries.  No cul-de-sac fluid.  The
visualized small bowel loops in the pelvis are unremarkable.

The abdomen is unremarkable and stable.
IMPRESSION: 1.  Persistent fistulous tract from the sigmoid colon to the
anterior abdominal wall.
2.  Slight interval decrease in size of the abscess just superior
to the uterus.
3.  Stable surgical changes involving the uterus.

## 2012-05-04 LAB — HM PAP SMEAR

## 2012-05-15 ENCOUNTER — Other Ambulatory Visit (HOSPITAL_BASED_OUTPATIENT_CLINIC_OR_DEPARTMENT_OTHER): Payer: PRIVATE HEALTH INSURANCE | Admitting: Lab

## 2012-05-15 DIAGNOSIS — D509 Iron deficiency anemia, unspecified: Secondary | ICD-10-CM

## 2012-05-15 DIAGNOSIS — I2699 Other pulmonary embolism without acute cor pulmonale: Secondary | ICD-10-CM

## 2012-05-15 LAB — CBC WITH DIFFERENTIAL/PLATELET
Basophils Absolute: 0 10*3/uL (ref 0.0–0.1)
Eosinophils Absolute: 0.1 10*3/uL (ref 0.0–0.5)
HCT: 40.5 % (ref 34.8–46.6)
HGB: 13.7 g/dL (ref 11.6–15.9)
MONO#: 0.3 10*3/uL (ref 0.1–0.9)
NEUT%: 55.8 % (ref 38.4–76.8)
WBC: 6.3 10*3/uL (ref 3.9–10.3)
lymph#: 2.4 10*3/uL (ref 0.9–3.3)

## 2012-07-16 ENCOUNTER — Other Ambulatory Visit: Payer: Self-pay

## 2012-07-16 ENCOUNTER — Telehealth: Payer: Self-pay

## 2012-07-16 ENCOUNTER — Telehealth: Payer: Self-pay | Admitting: Oncology

## 2012-07-16 ENCOUNTER — Ambulatory Visit: Payer: PRIVATE HEALTH INSURANCE | Admitting: Oncology

## 2012-07-16 ENCOUNTER — Other Ambulatory Visit: Payer: PRIVATE HEALTH INSURANCE

## 2012-07-16 NOTE — Telephone Encounter (Signed)
S/w pt and she said she is not coming in today for OV, she cannot get out of neighborhood.

## 2012-07-16 NOTE — Telephone Encounter (Signed)
s.w. pt and advised on 2.17.14 appt....pt aware and ok

## 2012-07-20 ENCOUNTER — Other Ambulatory Visit (HOSPITAL_BASED_OUTPATIENT_CLINIC_OR_DEPARTMENT_OTHER): Payer: BC Managed Care – PPO | Admitting: Lab

## 2012-07-20 ENCOUNTER — Other Ambulatory Visit: Payer: Self-pay | Admitting: Family

## 2012-07-20 ENCOUNTER — Telehealth: Payer: Self-pay | Admitting: *Deleted

## 2012-07-20 ENCOUNTER — Ambulatory Visit (HOSPITAL_BASED_OUTPATIENT_CLINIC_OR_DEPARTMENT_OTHER): Payer: BC Managed Care – PPO | Admitting: Oncology

## 2012-07-20 ENCOUNTER — Encounter: Payer: Self-pay | Admitting: Internal Medicine

## 2012-07-20 ENCOUNTER — Telehealth: Payer: Self-pay | Admitting: Oncology

## 2012-07-20 ENCOUNTER — Encounter: Payer: Self-pay | Admitting: Oncology

## 2012-07-20 ENCOUNTER — Ambulatory Visit (INDEPENDENT_AMBULATORY_CARE_PROVIDER_SITE_OTHER): Payer: BC Managed Care – PPO | Admitting: Internal Medicine

## 2012-07-20 VITALS — BP 140/90 | HR 90 | Temp 98.3°F | Resp 20 | Ht <= 58 in | Wt 237.3 lb

## 2012-07-20 VITALS — BP 110/80 | HR 88 | Temp 99.1°F | Wt 237.8 lb

## 2012-07-20 DIAGNOSIS — D259 Leiomyoma of uterus, unspecified: Secondary | ICD-10-CM

## 2012-07-20 DIAGNOSIS — D509 Iron deficiency anemia, unspecified: Secondary | ICD-10-CM

## 2012-07-20 DIAGNOSIS — I2699 Other pulmonary embolism without acute cor pulmonale: Secondary | ICD-10-CM

## 2012-07-20 DIAGNOSIS — N920 Excessive and frequent menstruation with regular cycle: Secondary | ICD-10-CM

## 2012-07-20 DIAGNOSIS — E669 Obesity, unspecified: Secondary | ICD-10-CM

## 2012-07-20 LAB — CBC WITH DIFFERENTIAL/PLATELET
Basophils Absolute: 0 10*3/uL (ref 0.0–0.1)
Eosinophils Absolute: 0.1 10*3/uL (ref 0.0–0.5)
HGB: 13.4 g/dL (ref 11.6–15.9)
LYMPH%: 27.1 % (ref 14.0–49.7)
MCV: 85.7 fL (ref 79.5–101.0)
MONO%: 3.1 % (ref 0.0–14.0)
NEUT#: 4.6 10*3/uL (ref 1.5–6.5)
Platelets: 256 10*3/uL (ref 145–400)
RDW: 13.2 % (ref 11.2–14.5)

## 2012-07-20 LAB — COMPREHENSIVE METABOLIC PANEL (CC13)
ALT: 14 U/L (ref 0–55)
AST: 13 U/L (ref 5–34)
CO2: 26 mEq/L (ref 22–29)
Sodium: 140 mEq/L (ref 136–145)
Total Bilirubin: 0.31 mg/dL (ref 0.20–1.20)
Total Protein: 7.4 g/dL (ref 6.4–8.3)

## 2012-07-20 LAB — FERRITIN: Ferritin: 210 ng/mL (ref 10–291)

## 2012-07-20 LAB — IRON AND TIBC: UIBC: 329 ug/dL (ref 125–400)

## 2012-07-20 NOTE — Assessment & Plan Note (Signed)
Weight trends reviwed- encouraged continued work and success on same Check lipids and TSH Wt Readings from Last 3 Encounters:  07/20/12 237 lb 12.8 oz (107.865 kg)  04/06/12 228 lb 9.6 oz (103.692 kg)  03/06/12 223 lb 9.6 oz (101.424 kg)

## 2012-07-20 NOTE — Patient Instructions (Signed)
It was good to see you today. We have reviewed your prior records including labs and tests today Test(s) ordered today. Your results will be released to MyChart (or called to you) after review, usually within 72hours after test completion. If any changes need to be made, you will be notified at that same time Continue working with your other specialists as ongoing Please schedule followup in 6 months, call sooner if problems.

## 2012-07-20 NOTE — Progress Notes (Signed)
Subjective:    Patient ID: [name], female    DOB: March 20, 1976, 37 y.o.   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 -hosp for same 02/2009 at [facility]  prior to that dx been on estrogen tx nuvaring for PCOS - hormones felt to be precipitating factor Also heterozygous for prothrombin II gene mutation in 03/2010= evidence for hypercoag state on prior lab panel -  has been following with heme ([facility]) - 10/2010 and 01/2011 OV " I should probably never come off coumadin" Second opinion at [facility] 01/2012 - recommended to stop coumadin - has been off all anticaog since - not to resume hormones or undergo surgery  obesity - reports weight has decreased since 02/2011 surgery/complications  Follows gluten free diet but no exercise program -  no known complications related to obesity (chol ok, no DM, no arthritis)   OSA - sleep study done 11/2009 - status post pulm eval for same -  Declined CPAP - reports improved on gluten free diet  Past Medical History  Diagnosis Date  . OBESITY   . PULMONARY EMBOLISM 02/2009    B PE - chronic anticoag (heme=[facility], [facility])  . 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 POLYCYSTIC OVARIAN DISEASE   . OBSTRUCTIVE SLEEP APNEA   . ANEMIA-IRON DEFICIENCY   . 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 Prothrombin mutation     heterozygous prothrombin II mutation  . Allergy     seasonal allergies  . Anxiety     Review of Systems  Constitutional: Negative for fatigue and unexpected weight change.  Respiratory: Negative for cough and shortness of breath.   Genitourinary: Positive for menstrual problem and pelvic pain. Negative for urgency.       Objective:   Physical Exam BP 110/80  Pulse 88  Temp(Src) 99.1 F (37.3 C) (Oral)  Wt 237 lb 12.8 oz (107.865 kg)  BMI 49.71 kg/m2  SpO2 99% Wt Readings from Last 3 Encounters:  07/20/12 237  lb 12.8 oz (107.865 kg)  04/06/12 228 lb 9.6 oz (103.692 kg)  03/06/12 223 lb 9.6 oz (101.424 kg)   Constitutional: She is obese, but appears well-developed and well-nourished. No distress.  Neck: Normal range of motion. Neck supple. No JVD present. No thyromegaly present.  Cardiovascular: Normal rate, regular rhythm and normal heart sounds.  No murmur heard. No BLE edema. Pulmonary/Chest: Effort normal and breath sounds normal. No respiratory distress. She has no wheezes.  Psychiatric: She has a normal mood and affect. Her behavior is normal. Judgment and thought content normal.   Lab Results  Component Value Date   WBC 6.8 07/20/2012   HGB 13.4 07/20/2012   HCT 39.3 07/20/2012   PLT 256 07/20/2012   GLUCOSE 95 07/20/2012   CHOL 135 04/01/2011   TRIG 156* 04/01/2011   HDL 74 02/01/2009   LDLCALC  Value: 82       02/01/2009   ALT 14 07/20/2012   AST 13 07/20/2012   NA 140 07/20/2012   K 3.8 07/20/2012   CL 105 07/20/2012   CREATININE 0.7 07/20/2012   BUN 9.8 07/20/2012   CO2 26 07/20/2012   TSH 0.450 ** 02/01/2009   INR 2.5 02/28/2012   HGBA1C  Value: 5.3 (NOTE) The ADA recommends the following therapeutic goal for glycemic control related to Hgb A1c measurement: Goal of therapy: <6.5  Hgb A1c  Reference: American Diabetes Association: Clinical Practice Recommendations 2010, Diabetes Care, 2010, 33: (Suppl  1). 02/01/2009        Assessment & Plan:   See problem list. Medications and labs reviewed today.

## 2012-07-20 NOTE — Progress Notes (Signed)
This office note has been dictated.  #[id number]

## 2012-07-20 NOTE — Progress Notes (Signed)
CC:   Alicia A. Felicity Coyer, MD [name], M.D. [name], M.D. [name], MD   PROBLEM LIST:  1. Bilateral pulmonary emboli on 01/31/2009. The patient was on a  NuvaRing at that time. Doppler studies of the legs were negative for DVT.  2. 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  3. Polycystic ovary disease.  4. Obesity.  5. Sleep apnea.  6. Uterine fibroids and menorrhagia.  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 Resolved. 8. Diverting loop sigmoid colostomy created on 03/27/2011 with     reversal carried out during the late spring or early summer of     2013. 9. Anticoagulation therapy was discontinued in August 2013 at the     recommendation of Dr. Cherie Dark from [facility]. 10. Iron deficiency anemia secondary to excessive menstrual bleeding.     Negative celiac panel carried out on 03/06/2012.  Patient received     IV Feraheme 510 mg on 11/20/2011 and 11/27/2011.  MEDICATIONS:  Reviewed and recorded. Current Outpatient Prescriptions  Medication Sig Dispense Refill  . Charcoal Activated (ACTIVATED CHARCOAL) POWD Take 1 tablet by mouth once a week. As needed      . EPINEPHrine (EPIPEN) 0.3 mg/0.3 mL DEVI Inject 0.3 mLs (0.3 mg total) into the muscle once.  1 Device  0  . ferrous sulfate 325 (65 FE) MG tablet Only takes when her cycle comes on      . Multiple Vitamins-Minerals (MULTIVITAMIN) tablet Take 1 tablet by mouth daily.       No current facility-administered medications for this visit.    IMMUNIZATIONS:  Influenza shot was given on 02/11/2012.  SMOKING HISTORY:  Patient has never smoked cigarettes.  HISTORY:  [name] was seen today for followup of her history of bilateral pulmonary emboli related to be heterozygous for the prothrombin 2 gene mutation.  In addition, the patient was on the NuvaRing at the time of her pulmonary emboli.  Doppler study of  the legs at that time was negative.  [name] was last seen by [place] on 01/17/2012 and prior to that on 09/12/2011.  [name] had been referred to [facility] for reversal of her diverting loop sigmoid colostomy that was created to treat her colocutaneous fistula, which developed in September 2012.  The patient was also seen by Dr. Cherie Dark, a clotting and coagulation specialist.  The patient has also been seen by a gynecologist because of her excessive menstrual bleeding and the inadvisability of using hormonal treatments to control that problem.  [name] underwent surgical reversal of her colostomy sometime in the spring or early summer of 2013.  We do not have those records but are trying to obtain them. [name] stayed on anticoagulation through August.  Again, she was having heavy menstrual bleeding due to her fibroids.  It was recommended by Dr. Isaiah Serge that anticoagulation therapy be stopped in the hopes that her menstrual bleeding would decrease.  Unfortunately, [name] tells me that she is continuing to have heavy periods about twice a month.  She is not having any blood clots.  As noted above, we did need to give her some IV Feraheme back on November 20, 2011 and again on November 27, 2011.  [name] tells me that she is constantly chewing ice.  That did not seem to improve even after her IV iron.  The plan at this point is to keep her off of anticoagulation.  [name] understands to stay away from any type of hormonal therapy.  If she needs to go on hormonal therapy then she will need to go back on anticoagulation.  She feels generally well.  She denies any respiratory symptoms. Previously she had severe shortness of breath and dyspnea on exertion. She has no leg symptoms.  All of her incisions have healed.  She remains with heavy menstrual periods.  PHYSICAL EXAM:  [name] looks well.  She is quite overweight.  Her weight today is 237 pounds 4.8 ounces.  Height 4 feet 10 inches.   Body surface area 2.1 sq m.  Blood pressure 140/90.  Other vital signs are normal.  O2 saturation on room air at rest was 100%.  There is no scleral icterus.  Mouth and pharynx are benign.  There is no peripheral adenopathy palpable.  Heart and lungs are normal.  Breasts are not examined.  Abdomen:  Obese, nontender.  The colostomy is no longer present.  All of her incisions have healed well.  Extremities:  No peripheral edema or clubbing.  Neurologic:  Normal.  LABORATORY DATA:  Today, white count 6.8.  ANC 4.6, hemoglobin 13.4, hematocrit 39.3, platelets 256,000.  Chemistries from today were normal. LDH is pending.  Gliadin IgA was 4.5 on 03/06/2012.  Gliadin IgG was 10.7, both of which are normal.  Ferritin on 05/15/2012 was 202.  On 03/20/2012 ferritin was 252, and on November 15, 2011 ferritin was 23.  IMAGING STUDIES:  1. CT scan of the abdomen and pelvis with IV contrast carried out on  02/20/2011 showed abscess collection identified superior to the  uterus extending along the left lateral aspect, 10.2 x 4.2 x 5 cm  in size, appearing to extend into the left lateral aspect of the  uterus and questionably site of myomectomy. There was additional  gas collection seen in the anterior abdominal wall fascial planes.  There was rounded focal fluid collection with enhancing wall in the  region of the left adnexa, possibly an ovarian cyst. There was an  open ventral wound anterior abdominal wall of pelvis.  2. CT scan of the abdomen and pelvis with IV contrast on 03/12/2011  showed persistent fistulous tract from the sigmoid colon to the  anterior abdominal wall, slight interval decrease in the size of  the abscess just superior to the uterus, and stable surgical  changes involving the uterus.  3. CT scan of the abdomen and pelvis without IV contrast on 05/09/2011  showed diverting loop colostomy in the left lower quadrant with no  complicating features. There was a tiny residual  colocutaneous  fistula. There was minimal residual subcutaneous and anterior  abdominal wall inflammation, but no abscess.  4. CT scan of the abdomen and pelvis with IV contrast on 06/27/2011  showed left lower quadrant loop colostomy with no extravasation of  contrast, although the proximal sigmoid is suboptimally opacified.  The appearance suggested the closure of the fistula that was seen  on the study of 05/09/2011.    PROCEDURES:  1. Flexible sigmoidoscopy with submucosal injections on 09/10/2011.  At 35 cm from the anus, there was a site of injured colon,  hypertrophied mucosa. Abutting this site directly, the lumen of  the colon was quite narrowed and the scope could not be passed  through it. This area was labeled with [place] ink injection.  2. EGD with duodenal biopsies to check for celiac sprue were carried  out. The EGD was normal.   IMPRESSION AND PLAN:  [name] seems to be doing well at the present time.  She is no longer on Coumadin at the suggestion of Dr. Cherie Dark from [facility].  As stated above, [name] is having heavy periods.  She may require additional intravenous iron.  Iron studies from today are pending.  We will plan to check CBC and ferritin as well as a D-dimer in 2 months. We will plan to see [name] again in 4 months, at which time we will check CBC, chemistries, and iron studies.  We will try to obtain the records from Dr. Isaiah Serge as well as the patient's Surgeon at [facility].    ______________________________ Samul Dada, M.D. DSM/MEDQ  D:  07/20/2012  T:  07/20/2012  Job:  [id number]

## 2012-07-20 NOTE — Assessment & Plan Note (Signed)
B PE dx 01/2009 while on hormone tx-  Initially recommended chronic anticoag indefinitely by local heme due to heterozygous for prothrombin II mutation (labs 03/2010) = specific hypercoag dz identified Now follows with [facility] heme - all anticoag stopped 01/2012 Interval hx reviewed

## 2012-07-20 NOTE — Assessment & Plan Note (Signed)
Chronic hx excessive menstrual bleeding/cramps, previously complicated by chronic anticoag Unable to use hormone therapy due to hypercoag state per heme S/p complicated myomectomy 02/2011 Now following with [facility] (heme on gyn) and considering other mgmt options Interval hx reviewed

## 2012-07-20 NOTE — Telephone Encounter (Signed)
Left msg on vm stating md wanting TSH & lipid add to blood work. They have already sent labs to [facility]. Can call them and add order on @ [phone]. Called [facility] spoke with Victorino Dike [phone]) they have'nt received labs yet from dr. Arline Asp. Not able to add on at this time. Will call bck in the morning and she will be able to add orders...Raechel Chute

## 2012-07-20 NOTE — Telephone Encounter (Signed)
Gave pt appt for lab and MD for April and June 2014 °

## 2012-07-21 LAB — LIPID PANEL
Cholesterol: 219 mg/dL — ABNORMAL HIGH (ref 0–200)
HDL: 61 mg/dL (ref >39)
LDL Cholesterol: 139 mg/dL — ABNORMAL HIGH (ref 0–99)
Triglycerides: 96 mg/dL (ref <150)

## 2012-07-21 LAB — TSH: TSH: 0.915 u[IU]/mL (ref 0.350–4.500)

## 2012-07-21 NOTE — Telephone Encounter (Signed)
Called Victorino Dike back @ [facility] she stated she did received labs from Dr, Arline Asp & went ahead and added TSH & Lipid fpr Dr. Felicity Coyer. Called Robin @ Dr. Arline Asp had to leave msg with info...Raechel Chute

## 2012-09-17 ENCOUNTER — Other Ambulatory Visit (HOSPITAL_BASED_OUTPATIENT_CLINIC_OR_DEPARTMENT_OTHER): Payer: BC Managed Care – PPO | Admitting: Lab

## 2012-09-17 DIAGNOSIS — D509 Iron deficiency anemia, unspecified: Secondary | ICD-10-CM

## 2012-09-17 DIAGNOSIS — I2699 Other pulmonary embolism without acute cor pulmonale: Secondary | ICD-10-CM

## 2012-09-17 LAB — CBC WITH DIFFERENTIAL/PLATELET
BASO%: 0.5 % (ref 0.0–2.0)
LYMPH%: 38 % (ref 14.0–49.7)
MCHC: 33.8 g/dL (ref 31.5–36.0)
MONO#: 0.2 10*3/uL (ref 0.1–0.9)
Platelets: 281 10*3/uL (ref 145–400)
RBC: 4.63 10*6/uL (ref 3.70–5.45)
WBC: 5.7 10*3/uL (ref 3.9–10.3)
lymph#: 2.2 10*3/uL (ref 0.9–3.3)

## 2012-09-18 LAB — FERRITIN: Ferritin: 231 ng/mL (ref 10–291)

## 2012-09-18 LAB — D-DIMER, QUANTITATIVE: D-Dimer, Quant: 0.46 ug/mL-FEU (ref 0.00–0.48)

## 2012-11-02 ENCOUNTER — Emergency Department (HOSPITAL_COMMUNITY)
Admission: EM | Admit: 2012-11-02 | Discharge: 2012-11-02 | Disposition: A | Discharge status: Home / Self Care | Payer: BC Managed Care – PPO | Source: Home / Self Care | Attending: Emergency Medicine | Admitting: Emergency Medicine

## 2012-11-02 ENCOUNTER — Encounter (HOSPITAL_COMMUNITY): Payer: Self-pay

## 2012-11-02 DIAGNOSIS — J029 Acute pharyngitis, unspecified: Secondary | ICD-10-CM

## 2012-11-02 MED ORDER — HYDROXYZINE HCL 25 MG PO TABS: 25.0000 mg | ORAL_TABLET | Freq: Four times a day (QID) | ORAL | Status: DC

## 2012-11-02 MED ORDER — PREDNISONE 5 MG PO KIT: 1.0000 | PACK | Freq: Every day | ORAL | Status: DC

## 2012-11-02 MED ORDER — HYDROCODONE-ACETAMINOPHEN 5-325 MG PO TABS: ORAL_TABLET | ORAL | Status: DC

## 2012-11-02 NOTE — ED Provider Notes (Signed)
Chief Complaint:   Chief Complaint  Patient presents with  . Sore Throat    History of Present Illness:   [name] is a 37 year old female who since this morning had sore throat, a sensation of her throat swelling and closing up, burning of her skin, low-grade fever of up to 100.1, chills, nasal congestion, rhinorrhea, dry cough, and nausea. She's had no known exposure to strep. She did have a history of strep in the past and this was when she was 37 years old. She denies any sick exposures or exposure to any allergens. She's had no headache, purulent nasal drainage, swollen glands, wheezing, chest pain, or GI symptoms. Her biggest complaint right now is a sensation of burning of the skin. There is no erythema and no rash.  Review of Systems:  Other than as noted above, the patient denies any of the following symptoms. Systemic:  No fever, chills, sweats, fatigue, myalgias, headache, or anorexia. Eye:  No redness, pain or drainage. ENT:  No earache, ear congestion, nasal congestion, sneezing, rhinorrhea, sinus pressure, sinus pain, or post nasal drip. Lungs:  No cough, sputum production, wheezing, shortness of breath, or chest pain. GI:  No abdominal pain, nausea, vomiting, or diarrhea. Skin:  No rash or itching.  PMFSH:  Past medical history, family history, social history, meds, allergies, and nurse's notes were reviewed.  There is no known exposure to strep or mono.  No prior history of step or mono.  The patient denies use of tobacco.   Physical Exam:   Vital signs:  BP 149/115  Pulse 82  Temp(Src) 98.6 F (37 C) (Oral)  Resp 18  SpO2 99%  LMP 10/15/2012 General:  Alert, in no distress. Eye:  No conjunctival injection or drainage. Lids were normal. ENT:  TMs and canals were normal, without erythema or inflammation.  Nasal mucosa was clear and uncongested, without drainage.  Mucous membranes were moist.  Exam of pharynx reveals the pharynx to be completely clear with no swelling,  erythema, exudate, or ulceration.  There were no oral ulcerations or lesions. Neck:  Supple, no adenopathy, tenderness or mass. Lungs:  No respiratory distress.  Lungs were clear to auscultation, without wheezes, rales or rhonchi.  Breath sounds were clear and equal bilaterally.  Heart:  Regular rhythm, without gallops, murmers or rubs. Skin:  Clear, warm, and dry, without rash or lesions. Her skin is completely clear with no erythema or rash.  Labs:   Results for orders placed during the hospital encounter of 11/02/12  POCT RAPID STREP A ([facility] CARE ONLY)      Result Value Range   Streptococcus, Group A Screen (Direct) NEGATIVE  NEGATIVE   Assessment:  The encounter diagnosis was Viral pharyngitis.  No evidence for strep. I am not sure why the burning of the skin. It does not appear to be an allergy or rash. May be secondary to viral infection.  Plan:   1.  The following meds were prescribed:   Discharge Medication List as of 11/02/2012  6:18 PM    START taking these medications   Details  HYDROcodone-acetaminophen (NORCO/VICODIN) 5-325 MG per tablet 1 to 2 tabs every 4 to 6 hours as needed for pain., Print    hydrOXYzine (ATARAX/VISTARIL) 25 MG tablet Take 1 tablet (25 mg total) by mouth every 6 (six) hours., Starting 11/02/2012, Until Discontinued, Normal    PredniSONE 5 MG KIT Take 1 kit (5 mg total) by mouth daily after breakfast. Prednisone 5 mg 6  day dosepack.  Take as directed., Starting 11/02/2012, Until Discontinued, Normal       2.  The patient was instructed in symptomatic care including hot saline gargles, throat lozenges, infectious precautions, and need to trade out toothbrush. Handouts were given. 3.  The patient was told to return if becoming worse in any way, if no better in 3 or 4 days, and given some red flag symptoms such as difficulty breathing or increasing fever that would indicate earlier return. 4.  Follow up here if she becomes worse in any way.    Reuben Likes, MD 11/02/12 [id number]

## 2012-11-02 NOTE — ED Notes (Signed)
C/o since this AM, she has been having problems w ST, hard to swallow, facial rash; state sshe took a pill, but had a hard time getting it to go down, had to go get a slushy drink that made it go down

## 2012-11-16 ENCOUNTER — Ambulatory Visit (HOSPITAL_BASED_OUTPATIENT_CLINIC_OR_DEPARTMENT_OTHER): Payer: BC Managed Care – PPO | Admitting: Oncology

## 2012-11-16 ENCOUNTER — Other Ambulatory Visit (HOSPITAL_BASED_OUTPATIENT_CLINIC_OR_DEPARTMENT_OTHER): Payer: BC Managed Care – PPO | Admitting: Lab

## 2012-11-16 ENCOUNTER — Encounter: Payer: Self-pay | Admitting: Oncology

## 2012-11-16 VITALS — BP 139/91 | HR 109 | Temp 98.4°F | Resp 20 | Ht <= 58 in | Wt 243.4 lb

## 2012-11-16 DIAGNOSIS — D509 Iron deficiency anemia, unspecified: Secondary | ICD-10-CM

## 2012-11-16 LAB — CBC WITH DIFFERENTIAL/PLATELET
Basophils Absolute: 0.1 10*3/uL (ref 0.0–0.1)
Eosinophils Absolute: 0.1 10*3/uL (ref 0.0–0.5)
HGB: 14.1 g/dL (ref 11.6–15.9)
LYMPH%: 27.2 % (ref 14.0–49.7)
MCV: 84.7 fL (ref 79.5–101.0)
MONO#: 0.3 10*3/uL (ref 0.1–0.9)
MONO%: 3.7 % (ref 0.0–14.0)
NEUT#: 5.3 10*3/uL (ref 1.5–6.5)
Platelets: 270 10*3/uL (ref 145–400)
RDW: 13.6 % (ref 11.2–14.5)

## 2012-11-16 LAB — LACTATE DEHYDROGENASE (CC13): LDH: 181 U/L (ref 125–245)

## 2012-11-16 LAB — COMPREHENSIVE METABOLIC PANEL (CC13)
ALT: 16 U/L (ref 0–55)
AST: 11 U/L (ref 5–34)
CO2: 19 mEq/L — ABNORMAL LOW (ref 22–29)
Sodium: 139 mEq/L (ref 136–145)
Total Bilirubin: 0.37 mg/dL (ref 0.20–1.20)
Total Protein: 7.9 g/dL (ref 6.4–8.3)

## 2012-11-16 LAB — IRON AND TIBC
%SAT: 11 % — ABNORMAL LOW (ref 20–55)
Iron: 45 ug/dL (ref 42–145)
TIBC: 417 ug/dL (ref 250–470)

## 2012-11-16 NOTE — Progress Notes (Signed)
This office note has been dictated.  #[id number]

## 2012-11-16 NOTE — Progress Notes (Signed)
CC:   Alicia A. Felicity Coyer, MD [name], M.D. [name], M.D. [name], MD  PROBLEM LIST:  1. Bilateral pulmonary emboli on 01/31/2009. The patient was on a  NuvaRing at that time. Doppler studies of the legs were negative for DVT.  2. 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  3. Polycystic ovary disease.  4. Obesity.  5. Sleep apnea.  6. Uterine fibroids and menorrhagia.  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 Resolved.  8. Diverting loop sigmoid colostomy created on 03/27/2011 with  reversal carried out during the late spring or early summer of  2013.  9. Anticoagulation therapy was discontinued in August 2013 at the  recommendation of Dr. Cherie Dark from [facility]  10. Iron deficiency anemia secondary to excessive menstrual bleeding.  A von Willebrand's panel carried out on 11/21/2009 was negative. A celiac panel carried out on 03/06/2012 was negative. Patient received IV Feraheme 510 mg on 11/20/2011 and 11/27/2011.   MEDICATIONS:  Reviewed and recorded. Current Outpatient Prescriptions  Medication Sig Dispense Refill  . Charcoal Activated (ACTIVATED CHARCOAL) POWD Take 1 tablet by mouth once a week. As needed      . EPINEPHrine (EPIPEN) 0.3 mg/0.3 mL DEVI Inject 0.3 mLs (0.3 mg total) into the muscle once.  1 Device  0  . ferrous sulfate 325 (65 FE) MG tablet Only takes when her cycle comes on      . Multiple Vitamin (MULTIVITAMIN) tablet Take 1 tablet by mouth daily.       No current facility-administered medications for this visit.   IMMUNIZATIONS: Influenza shot was given on 02/11/2012.   SMOKING HISTORY: Patient has never smoked cigarettes.    HISTORY:  I saw [name] today for followup of a history of iron- deficiency anemia, requiring IV iron in June 2013.  We have also followed the patient  in the past for a history of bilateral pulmonary emboli  related to being heterozygous for the prothrombin II gene mutation, in association with NuvaRing at the time of her pulmonary emboli.  Doppler study of the legs were negative.  The patient's pulmonary embolism occurred in August 2010.  The patient is no longer on anticoagulation therapy after having seen Dr. Johnna Acosta at [facility].  [name] was last seen by [place] on 07/20/2012.  She is on ferrous sulfate 325 mg twice daily.  She continues to have heavy periods as previously described.  She has blood clots associated with periods that last 4-6 days, occurring usually once monthly but occasionally twice monthly. The patient does admit to ice chewing, which she has been apparently having for some time, even when she is not iron deficient.  Stools are dark on iron.  She denies any obvious blood.  At the present time, the patient feels well and is without complaints.  PHYSICAL EXAM:  Vital signs:  [name] is morbidly obese.  Weight is 243 pounds, 6.4 ounces.  Back in August 2013, she weighed 216.5 pounds. Height 4 feet 10 inches, body surface area 2.13 sq m.  Blood pressure 139/91.  Other vital signs are normal.  O2 saturation on room air at rest was 98%.  HEENT:  There was no scleral icterus.  Mouth and pharynx are benign.  There was no peripheral adenopathy palpable.  Heart and lungs:  Normal.  Breasts were not examined.  Abdomen:  Obese, nontender, with no organomegaly or masses palpable.  Extremities:  No pitting edema or clubbing.  Neurologic: Exam was normal.  LABORATORY DATA:  Today, white count 8.0, ANC 5.3, hemoglobin 14.1, hematocrit 41.7, platelets 270,000.  Chemistries today were normal. Iron studies are pending.  On 09/17/2012, ferritin was 231; and, on 07/20/2012, ferritin was 210, with an iron saturation of 16%.  D-dimer on 09/17/2012 was 0.46, which is normal.  TSH on 07/20/2012 was 0.915.  IMAGING STUDIES:  1. CT scan of the abdomen and pelvis with IV contrast  carried out on  02/20/2011 showed abscess collection identified superior to the  uterus extending along the left lateral aspect, 10.2 x 4.2 x 5 cm  in size, appearing to extend into the left lateral aspect of the  uterus and questionably site of myomectomy. There was additional  gas collection seen in the anterior abdominal wall fascial planes.  There was rounded focal fluid collection with enhancing wall in the  region of the left adnexa, possibly an ovarian cyst. There was an  open ventral wound anterior abdominal wall of pelvis.  2. CT scan of the abdomen and pelvis with IV contrast on 03/12/2011  showed persistent fistulous tract from the sigmoid colon to the  anterior abdominal wall, slight interval decrease in the size of  the abscess just superior to the uterus, and stable surgical  changes involving the uterus.  3. CT scan of the abdomen and pelvis without IV contrast on 05/09/2011  showed diverting loop colostomy in the left lower quadrant with no  complicating features. There was a tiny residual colocutaneous  fistula. There was minimal residual subcutaneous and anterior  abdominal wall inflammation, but no abscess.  4. CT scan of the abdomen and pelvis with IV contrast on 06/27/2011  showed left lower quadrant loop colostomy with no extravasation of  contrast, although the proximal sigmoid is suboptimally opacified.  The appearance suggested the closure of the fistula that was seen  on the study of 05/09/2011.    PROCEDURES:  1. Flexible sigmoidoscopy with submucosal injections on 09/10/2011.  At 35 cm from the anus, there was a site of injured colon,  hypertrophied mucosa. Abutting this site directly, the lumen of  the colon was quite narrowed and the scope could not be passed  through it. This area was labeled with [place] ink injection.  2. EGD with duodenal biopsies to check for celiac sprue were carried  out. The EGD was normal.   IMPRESSION AND PLAN:  [name] seems  to be doing fairly well at the present time.  She is still having heavy periods.  She is on oral iron and her ferritin as well as her hemoglobin and hematocrit seem to be fairly well maintained.  We are checking iron studies today.  If the ferritin is low, the patient may require IV iron.  The patient apparently is on no specific therapy regarding her heavy menses. [name] understands that she should not be on any type of hormones due to her increased risk for developing blood clots.  We will check CBC and iron studies in 3 months and plan to see [name] again in 6 months, at which time we will check CBC, chemistries, and iron studies.  We are still trying to obtain records from Dr. Cherie Dark, at [facility].    ______________________________ Samul Dada, M.D. DSM/MEDQ  D:  11/16/2012  T:  11/16/2012  Job:  [id number]

## 2013-01-02 ENCOUNTER — Emergency Department (HOSPITAL_COMMUNITY): Payer: BC Managed Care – PPO

## 2013-01-02 ENCOUNTER — Emergency Department (HOSPITAL_COMMUNITY)
Admission: EM | Admit: 2013-01-02 | Discharge: 2013-01-03 | Disposition: A | Discharge status: Home / Self Care | Payer: BC Managed Care – PPO | Attending: Emergency Medicine | Admitting: Emergency Medicine

## 2013-01-02 ENCOUNTER — Encounter (HOSPITAL_COMMUNITY): Payer: Self-pay | Admitting: Family Medicine

## 2013-01-02 DIAGNOSIS — Z86711 Personal history of pulmonary embolism: Secondary | ICD-10-CM | POA: Insufficient documentation

## 2013-01-02 DIAGNOSIS — Z862 Personal history of diseases of the blood and blood-forming organs and certain disorders involving the immune mechanism: Secondary | ICD-10-CM | POA: Insufficient documentation

## 2013-01-02 DIAGNOSIS — D509 Iron deficiency anemia, unspecified: Secondary | ICD-10-CM | POA: Insufficient documentation

## 2013-01-02 DIAGNOSIS — R109 Unspecified abdominal pain: Secondary | ICD-10-CM

## 2013-01-02 DIAGNOSIS — Z8639 Personal history of other endocrine, nutritional and metabolic disease: Secondary | ICD-10-CM | POA: Insufficient documentation

## 2013-01-02 DIAGNOSIS — E669 Obesity, unspecified: Secondary | ICD-10-CM | POA: Insufficient documentation

## 2013-01-02 DIAGNOSIS — N83209 Unspecified ovarian cyst, unspecified side: Secondary | ICD-10-CM | POA: Insufficient documentation

## 2013-01-02 DIAGNOSIS — Z3202 Encounter for pregnancy test, result negative: Secondary | ICD-10-CM | POA: Insufficient documentation

## 2013-01-02 DIAGNOSIS — Z79899 Other long term (current) drug therapy: Secondary | ICD-10-CM | POA: Insufficient documentation

## 2013-01-02 DIAGNOSIS — Z8719 Personal history of other diseases of the digestive system: Secondary | ICD-10-CM | POA: Insufficient documentation

## 2013-01-02 DIAGNOSIS — D259 Leiomyoma of uterus, unspecified: Secondary | ICD-10-CM | POA: Insufficient documentation

## 2013-01-02 DIAGNOSIS — Z8659 Personal history of other mental and behavioral disorders: Secondary | ICD-10-CM | POA: Insufficient documentation

## 2013-01-02 DIAGNOSIS — Z8544 Personal history of malignant neoplasm of other female genital organs: Secondary | ICD-10-CM | POA: Insufficient documentation

## 2013-01-02 LAB — POCT I-STAT, CHEM 8
BUN: 11 mg/dL (ref 6–23)
Calcium, Ion: 1.11 mmol/L — ABNORMAL LOW (ref 1.12–1.23)
Chloride: 103 mEq/L (ref 96–112)
Creatinine, Ser: 0.9 mg/dL (ref 0.50–1.10)
Glucose, Bld: 102 mg/dL — ABNORMAL HIGH (ref 70–99)
TCO2: 23 mmol/L (ref 0–100)

## 2013-01-02 LAB — URINALYSIS, ROUTINE W REFLEX MICROSCOPIC
Bilirubin Urine: NEGATIVE
Glucose, UA: NEGATIVE mg/dL
Hgb urine dipstick: NEGATIVE
Ketones, ur: NEGATIVE mg/dL
Nitrite: NEGATIVE
Specific Gravity, Urine: 1.034 — ABNORMAL HIGH (ref 1.005–1.030)
pH: 5 (ref 5.0–8.0)

## 2013-01-02 MED ORDER — IOHEXOL 300 MG/ML  SOLN: 50.0000 mL | Freq: Once | INTRAMUSCULAR | Status: AC | PRN

## 2013-01-02 NOTE — ED Provider Notes (Signed)
CSN: [id number]     Arrival date & time 01/02/13  2210 History     First MD Initiated Contact with Patient 01/02/13 2244     Chief Complaint  Patient presents with  . Abdominal Pain   (Consider location/radiation/quality/duration/timing/severity/associated sxs/prior Treatment) HPI [name] is a 37 y.o. female who presents to ED with complaint of abdominal pian. States she was sitting down when she suddenly felt a "pop" in her abdomen, in the area where her colostomy reversal was.  States had reversal a year ago, no major issues since. States pain was severe when this happened, and it is gradually improving. Pain is worsened with movement, palpation of the abdomen. No n/v. No changes in bowels. No urinary symptoms. No swelling in the abdomen.    Past Medical History  Diagnosis Date  . OBESITY   . 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 POLYCYSTIC OVARIAN DISEASE   . OBSTRUCTIVE SLEEP APNEA   . ANEMIA-IRON DEFICIENCY   . 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 Prothrombin mutation     heterozygous prothrombin II mutation  . Allergy     seasonal allergies  . Anxiety    Past Surgical History  Procedure Laterality Date  . Myomectomy abdominal approach  1997    No complications  . Myomectomy  02/12/2011    Procedure: MYOMECTOMY;  Surgeon: Turner Daniels, MD;  Location: [facility];  Service: Gynecology;  Laterality: N/A;  . Colostomy  03/2011    Dr. Luisa Hart  . Colostomy reversal     Family History  Problem Relation Age of Onset  . Allergies Mother   . Breast cancer Mother   . Asthma Paternal Grandmother   . Emphysema Paternal Grandfather   . Colon cancer Neg Hx   . Diabetes Maternal Grandmother   . Dementia Father    History  Substance Use Topics  . Smoking status: Never Smoker   . Smokeless tobacco: Never Used  . Alcohol  Use: No   OB History   Grav Para Term Preterm Abortions TAB SAB Ect Mult Living   0              Review of Systems  Constitutional: Negative for chills and fatigue.  Respiratory: Negative.   Cardiovascular: Negative.   Gastrointestinal: Positive for abdominal pain. Negative for nausea, vomiting, diarrhea and constipation.  Genitourinary: Negative for dysuria, urgency, flank pain, vaginal pain and pelvic pain.  Musculoskeletal: Negative for back pain.  Neurological: Negative for facial asymmetry and weakness.    Allergies  Oxycodone; Gluten; and Other  Home Medications   Current Outpatient Rx  Name  Route  Sig  Dispense  Refill  . Charcoal Activated (ACTIVATED CHARCOAL) POWD   Oral   Take 1 tablet by mouth once a week. As needed         . EPINEPHrine (EPIPEN) 0.3 mg/0.3 mL DEVI   Intramuscular   Inject 0.3 mLs (0.3 mg total) into the muscle once.   1 Device   0   . ferrous sulfate 325 (65 FE) MG tablet      Only takes when her cycle comes on         . Multiple Vitamin (MULTIVITAMIN) tablet   Oral   Take 1 tablet by mouth daily.  BP 146/101  Pulse 103  Temp(Src) 98.3 F (36.8 C) (Oral)  Resp 20  Ht 4\' 10"  (1.473 m)  Wt 220 lb (99.791 kg)  BMI 45.99 kg/m2  SpO2 100%  LMP 12/10/2012 Physical Exam  Nursing note and vitals reviewed. Constitutional: She appears well-developed and well-nourished. No distress.  Eyes: Conjunctivae are normal.  Neck: Neck supple.  Cardiovascular: Normal rate, regular rhythm and normal heart sounds.   Pulmonary/Chest: Effort normal and breath sounds normal. No respiratory distress. She has no wheezes. She has no rales.  Abdominal: Soft. Bowel sounds are normal. She exhibits no distension. There is tenderness. There is no rebound and no guarding.  LLQ scar from prior colostomy. Tender to palpation over LLQ. No hernia palpated. No swelling, bruising, erythema.   Musculoskeletal: She exhibits no edema.  Neurological: She  is alert.  Skin: Skin is warm and dry.    ED Course   Procedures (including critical care time)  Labs Reviewed  URINALYSIS, ROUTINE W REFLEX MICROSCOPIC - Abnormal; Notable for the following:    Specific Gravity, Urine 1.034 (*)    All other components within normal limits  POCT I-STAT, CHEM 8 - Abnormal; Notable for the following:    Glucose, Bld 102 (*)    Calcium, Ion 1.11 (*)    All other components within normal limits  CBC  POCT PREGNANCY, URINE   No results found.  1. Abdominal pain   2. Ovarian cyst   3. Uterine fibroid     MDM  Pt with prior colostomy and colostomy reversal a year ago. Today sharp pain in the LLQ where prior colostomy site was. Pt continues to have pain. TTP in the LLQ. No guarding. NO peritoneal signs at this time. Given her history, will get CT abd/pelvis for further evaluation.   Lottie Mussel, PA-C 01/03/13 1921

## 2013-01-02 NOTE — ED Notes (Signed)
Patient states that she had a colostomy reversal in 2013. Today around 5pm she "felt a pop where the colostomy used to be and feels like liquid is leaking into my stomach." Also has a history of fibroids and ovarian cysts.

## 2013-01-03 ENCOUNTER — Emergency Department (HOSPITAL_COMMUNITY): Payer: BC Managed Care – PPO

## 2013-01-03 LAB — CBC
HCT: 38.1 % (ref 36.0–46.0)
Hemoglobin: 13 g/dL (ref 12.0–15.0)
MCH: 28.6 pg (ref 26.0–34.0)
MCV: 83.7 fL (ref 78.0–100.0)
RBC: 4.55 MIL/uL (ref 3.87–5.11)
WBC: 8.7 10*3/uL (ref 4.0–10.5)

## 2013-01-03 MED ORDER — IOHEXOL 300 MG/ML  SOLN
50.0000 mL | Freq: Once | INTRAMUSCULAR | Status: AC | PRN
  Administered 2013-01-03: 50 mL via ORAL

## 2013-01-03 MED ORDER — IOHEXOL 300 MG/ML  SOLN
100.0000 mL | Freq: Once | INTRAMUSCULAR | Status: AC | PRN
  Administered 2013-01-03: 100 mL via INTRAVENOUS

## 2013-01-03 MED ORDER — TRAMADOL HCL 50 MG PO TABS: 50.0000 mg | ORAL_TABLET | Freq: Four times a day (QID) | ORAL | Status: DC | PRN

## 2013-01-03 NOTE — ED Notes (Signed)
Patient transported to CT 

## 2013-01-03 NOTE — ED Provider Notes (Signed)
Medical screening examination/treatment/procedure(s) were performed by non-physician practitioner and as supervising physician I was immediately available for consultation/collaboration.  Jones Skene, M.D.   Jones Skene, MD 01/03/13 [phone]

## 2013-01-03 NOTE — ED Provider Notes (Signed)
Medical screening examination/treatment/procedure(s) were performed by non-physician practitioner and as supervising physician I was immediately available for consultation/collaboration.  John-Adam Krue Peterka, M.D.     John-Adam Ginni Eichler, MD 01/03/13 2254 

## 2013-01-03 NOTE — ED Provider Notes (Signed)
[name] S 1:00 a.m. patient discussed in sign out. Patient with past history of multiple fibroids, ovarian cysts, bowel perforation requiring an ostomy with subsequent takedown who presents with complaints of acute onset left lower abdominal pain near her ostomy scar. Patient also had reported a popping sound. CT scan ordered. Plan to evaluate CT scan results and reevaluate patient for further treatments.   1:45AM CT scan does not showing acute findings however there is a moderate previewed 3.5 x 4 cm left ovary and cyst. I discussed findings with the patient's at this time we will proceed with the pelvic examination to determine if the pain is located in the area of this ovary. We may also proceed with ultrasound to rule out torsion.  Pelvic examination performed with chaperone present. Exam is limited given body habitus. She does have tenderness in the left adnexal area but does feel similar to the pain that brought her. We'll proceed with ultrasound to rule out torsion. Patient agrees with plan.   No signs of torsion on ultrasound. Patient also with a small uterine fibroids. No other concerning findings. She will be discharged at this time and followup with her PCP and specialist.  Angus Seller, PA-C 01/03/13 [phone]

## 2013-01-18 ENCOUNTER — Ambulatory Visit (INDEPENDENT_AMBULATORY_CARE_PROVIDER_SITE_OTHER): Payer: BC Managed Care – PPO | Admitting: Internal Medicine

## 2013-01-18 ENCOUNTER — Encounter: Payer: Self-pay | Admitting: Internal Medicine

## 2013-01-18 VITALS — BP 152/98 | HR 87 | Temp 99.0°F | Wt 246.6 lb

## 2013-01-18 DIAGNOSIS — Z0000 Encounter for general adult medical examination without abnormal findings: Secondary | ICD-10-CM

## 2013-01-18 DIAGNOSIS — D259 Leiomyoma of uterus, unspecified: Secondary | ICD-10-CM

## 2013-01-18 DIAGNOSIS — E669 Obesity, unspecified: Secondary | ICD-10-CM

## 2013-01-18 DIAGNOSIS — I2699 Other pulmonary embolism without acute cor pulmonale: Secondary | ICD-10-CM

## 2013-01-18 NOTE — Assessment & Plan Note (Signed)
Chronic hx excessive menstrual bleeding/cramps, previously complicated by chronic anticoag Unable to use hormone therapy due to hypercoag state per heme S/p complicated myomectomy 02/2011 Now following with [facility] (heme and gyn) and considering other mgmt options -?Mirena Interval hx reviewed

## 2013-01-18 NOTE — Progress Notes (Signed)
Subjective:    Patient ID: [name], female    DOB: 28-Jan-1976, 37 y.o.   MRN: [id number]  HPI  patient is here today for annual physical. 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 -hosp for same 02/2009 at [facility]  prior to that dx been on estrogen tx nuvaring for PCOS - hormones felt to be precipitating factor Also heterozygous for prothrombin II gene mutation in 03/2010 has been following with heme (murrinson and Bohl [facility] 01/2012) - recommended to stop coumadin - has been off all anticaog since - not to resume hormones or undergo surgery  obesity - reports weight has decreased since 02/2011 surgery/complications  Follows gluten free diet but no exercise program -  no known complications related to obesity (chol ok, no DM, no arthritis)   OSA - sleep study done 11/2009 - status post pulm eval for same -  Declined CPAP - reports improved on gluten free diet  Past Medical History  Diagnosis Date  . OBESITY   . 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 POLYCYSTIC OVARIAN DISEASE   . OBSTRUCTIVE SLEEP APNEA   . ANEMIA-IRON DEFICIENCY   . 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 Prothrombin mutation     heterozygous prothrombin II mutation  . Allergy     seasonal allergies  . Anxiety    Family History  Problem Relation Age of Onset  . Allergies Mother   . Breast cancer Mother   . Asthma Paternal Grandmother   . Emphysema Paternal Grandfather   . Colon cancer Neg Hx   . Diabetes Maternal Grandmother   . Dementia Father    History  Substance Use Topics  . Smoking status: Never Smoker   . Smokeless tobacco: Never Used  . Alcohol Use: No    Review of Systems  Constitutional: Negative for fatigue and unexpected weight  change.  Respiratory: Negative for cough and shortness of breath.   Genitourinary: Positive for menstrual problem and pelvic pain. Negative for urgency.  Skin: Negative for rash.  Neurological: Negative for dizziness or headache.  No other specific complaints in a complete review of systems (except as listed in HPI above).      Objective:   Physical Exam  BP 152/98  Pulse 87  Temp(Src) 99 F (37.2 C) (Oral)  Wt 246 lb 9.6 oz (111.857 kg)  BMI 51.55 kg/m2  SpO2 94%  LMP 12/10/2012 Wt Readings from Last 3 Encounters:  01/18/13 246 lb 9.6 oz (111.857 kg)  01/02/13 220 lb (99.791 kg)  11/16/12 243 lb 6.4 oz (110.406 kg)   Constitutional: She is obese, but appears well-developed and well-nourished. No distress.  HENT: Head: Normocephalic and atraumatic. Ears: B TMs ok, no erythema or effusion; Nose: Nose normal. Mouth/Throat: Oropharynx is clear and moist. No oropharyngeal exudate.  Eyes: Conjunctivae and EOM are normal. Pupils are equal, round, and reactive to light. No scleral icterus.  Neck: Normal range of motion. Neck supple. No JVD present. No thyromegaly present.  Cardiovascular: Normal rate, regular rhythm and normal heart sounds.  No murmur heard. No BLE edema. Pulmonary/Chest: Effort normal and breath sounds normal. No respiratory distress. She has no wheezes.  Abdominal: Soft. Bowel sounds are normal. She exhibits  no distension. There is no tenderness. no masses Musculoskeletal: Normal range of motion, no joint effusions. No gross deformities Neurological: She is alert and oriented to person, place, and time. No cranial nerve deficit. Coordination, balance, strength, speech and gait are normal.  Skin: Skin is warm and dry. No rash noted. No erythema.  Psychiatric: She has a normal mood and affect. Her behavior is normal. Judgment and thought content normal.      Lab Results  Component Value Date   WBC 8.7 01/02/2013   HGB 13.6 01/02/2013   HCT 40.0 01/02/2013   PLT 274  01/02/2013   GLUCOSE 102* 01/02/2013   CHOL 219* 07/20/2012   TRIG 96 07/20/2012   HDL 61 07/20/2012   LDLCALC 139* 07/20/2012   ALT 16 11/16/2012   AST 11 11/16/2012   NA 138 01/02/2013   K 3.6 01/02/2013   CL 103 01/02/2013   CREATININE 0.90 01/02/2013   BUN 11 01/02/2013   CO2 19* 11/16/2012   TSH 0.915 07/20/2012   INR 2.5 02/28/2012   HGBA1C  Value: 5.3 (NOTE) The ADA recommends the following therapeutic goal for glycemic control related to Hgb A1c measurement: Goal of therapy: <6.5 Hgb A1c  Reference: American Diabetes Association: Clinical Practice Recommendations 2010, Diabetes Care, 2010, 33: (Suppl  1). 02/01/2009        Assessment & Plan:   CPX/v70.0 - Patient has been counseled on age-appropriate routine health concerns for screening and prevention. These are reviewed and up-to-date. Immunizations are up-to-date or declined. Labs reviewed.  Also see problem list. Medications and labs reviewed today.

## 2013-01-18 NOTE — Assessment & Plan Note (Signed)
Weight trends reviwed- encouraged continued work and success on same  [employer] Readings from Last 3 Encounters:  01/18/13 246 lb 9.6 oz (111.857 kg)  01/02/13 220 lb (99.791 kg)  11/16/12 243 lb 6.4 oz (110.406 kg)

## 2013-01-18 NOTE — Assessment & Plan Note (Signed)
B PE dx 01/2009 while on hormone tx-  Initially recommended chronic anticoag indefinitely by local heme due to heterozygous for prothrombin II mutation (labs 03/2010) = specific hypercoag dz identified Now follows with [facility] heme - all anticoag stopped 01/2012 Interval hx reviewed

## 2013-01-18 NOTE — Patient Instructions (Addendum)
It was good to see you today. We have reviewed your prior records including labs and tests today Health Maintenance reviewed - all recommended immunizations and age-appropriate screenings are up-to-date. Continue working with your other specialists as ongoing Work on lifestyle changes as discussed (low fat, low carb, increased protein diet; improved exercise efforts; weight loss) to control sugar, blood pressure and cholesterol levels and/or reduce risk of developing other medical problems. Look into [email or web address] or other type of food journal to assist you in this process. Please schedule followup in 6 months, call sooner if problems. Health Maintenance, Females A healthy lifestyle and preventative care can promote health and wellness.  Maintain regular health, dental, and eye exams.  Eat a healthy diet. Foods like vegetables, fruits, whole grains, low-fat dairy products, and lean protein foods contain the nutrients you need without too many calories. Decrease your intake of foods high in solid fats, added sugars, and salt. Get information about a proper diet from your caregiver, if necessary.  Regular physical exercise is one of the most important things you can do for your health. Most adults should get at least 150 minutes of moderate-intensity exercise (any activity that increases your heart rate and causes you to sweat) each week. In addition, most adults need muscle-strengthening exercises on 2 or more days a week.   Maintain a healthy weight. The body mass index (BMI) is a screening tool to identify possible weight problems. It provides an estimate of body fat based on height and weight. Your caregiver can help determine your BMI, and can help you achieve or maintain a healthy weight. For adults 20 years and older:  A BMI below 18.5 is considered underweight.  A BMI of 18.5 to 24.9 is normal.  A BMI of 25 to 29.9 is considered overweight.  A BMI of 30 and above is considered  obese.  Maintain normal blood lipids and cholesterol by exercising and minimizing your intake of saturated fat. Eat a balanced diet with plenty of fruits and vegetables. Blood tests for lipids and cholesterol should begin at age 88 and be repeated every 5 years. If your lipid or cholesterol levels are high, you are over 50, or you are a high risk for heart disease, you may need your cholesterol levels checked more frequently.Ongoing high lipid and cholesterol levels should be treated with medicines if diet and exercise are not effective.  If you smoke, find out from your caregiver how to quit. If you do not use tobacco, do not start.  If you are pregnant, do not drink alcohol. If you are breastfeeding, be very cautious about drinking alcohol. If you are not pregnant and choose to drink alcohol, do not exceed 1 drink per day. One drink is considered to be 12 ounces (355 mL) of beer, 5 ounces (148 mL) of wine, or 1.5 ounces (44 mL) of liquor.  Avoid use of street drugs. Do not share needles with anyone. Ask for help if you need support or instructions about stopping the use of drugs.  High blood pressure causes heart disease and increases the risk of stroke. Blood pressure should be checked at least every 1 to 2 years. Ongoing high blood pressure should be treated with medicines, if weight loss and exercise are not effective.  If you are 74 to 39 years old, ask your caregiver if you should take aspirin to prevent strokes.  Diabetes screening involves taking a blood sample to check your fasting blood sugar level. This should be done once  every 3 years, after age 12, if you are within normal weight and without risk factors for diabetes. Testing should be considered at a younger age or be carried out more frequently if you are overweight and have at least 1 risk factor for diabetes.  Breast cancer screening is essential preventative care for women. You should practice "breast self-awareness." This means  understanding the normal appearance and feel of your breasts and may include breast self-examination. Any changes detected, no matter how small, should be reported to a caregiver. Women in their 48s and 30s should have a clinical breast exam (CBE) by a caregiver as part of a regular health exam every 1 to 3 years. After age 36, women should have a CBE every year. Starting at age 4, women should consider having a mammogram (breast X-ray) every year. Women who have a family history of breast cancer should talk to their caregiver about genetic screening. Women at a high risk of breast cancer should talk to their caregiver about having an MRI and a mammogram every year.  The Pap test is a screening test for cervical cancer. Women should have a Pap test starting at age 72. Between ages 48 and 66, Pap tests should be repeated every 2 years. Beginning at age 42, you should have a Pap test every 3 years as long as the past 3 Pap tests have been normal. If you had a hysterectomy for a problem that was not cancer or a condition that could lead to cancer, then you no longer need Pap tests. If you are between ages 50 and 72, and you have had normal Pap tests going back 10 years, you no longer need Pap tests. If you have had past treatment for cervical cancer or a condition that could lead to cancer, you need Pap tests and screening for cancer for at least 20 years after your treatment. If Pap tests have been discontinued, risk factors (such as a new sexual partner) need to be reassessed to determine if screening should be resumed. Some women have medical problems that increase the chance of getting cervical cancer. In these cases, your caregiver may recommend more frequent screening and Pap tests.  The human papillomavirus (HPV) test is an additional test that may be used for cervical cancer screening. The HPV test looks for the virus that can cause the cell changes on the cervix. The cells collected during the Pap test  can be tested for HPV. The HPV test could be used to screen women aged 74 years and older, and should be used in women of any age who have unclear Pap test results. After the age of 56, women should have HPV testing at the same frequency as a Pap test.  Colorectal cancer can be detected and often prevented. Most routine colorectal cancer screening begins at the age of 12 and continues through age 62. However, your caregiver may recommend screening at an earlier age if you have risk factors for colon cancer. On a yearly basis, your caregiver may provide home test kits to check for hidden blood in the stool. Use of a small camera at the end of a tube, to directly examine the colon (sigmoidoscopy or colonoscopy), can detect the earliest forms of colorectal cancer. Talk to your caregiver about this at age 48, when routine screening begins. Direct examination of the colon should be repeated every 5 to 10 years through age 78, unless early forms of pre-cancerous polyps or small growths are found.  Hepatitis  C blood testing is recommended for all people born from [birth year] through 1967 and any individual with known risks for hepatitis C.  Practice safe sex. Use condoms and avoid high-risk sexual practices to reduce the spread of sexually transmitted infections (STIs). Sexually active women aged 33 and younger should be checked for Chlamydia, which is a common sexually transmitted infection. Older women with new or multiple partners should also be tested for Chlamydia. Testing for other STIs is recommended if you are sexually active and at increased risk.  Osteoporosis is a disease in which the bones lose minerals and strength with aging. This can result in serious bone fractures. The risk of osteoporosis can be identified using a bone density scan. Women ages 45 and over and women at risk for fractures or osteoporosis should discuss screening with their caregivers. Ask your caregiver whether you should be taking a  calcium supplement or vitamin D to reduce the rate of osteoporosis.  Menopause can be associated with physical symptoms and risks. Hormone replacement therapy is available to decrease symptoms and risks. You should talk to your caregiver about whether hormone replacement therapy is right for you.  Use sunscreen with a sun protection factor (SPF) of 30 or greater. Apply sunscreen liberally and repeatedly throughout the day. You should seek shade when your shadow is shorter than you. Protect yourself by wearing long sleeves, pants, a wide-brimmed hat, and sunglasses year round, whenever you are outdoors.  Notify your caregiver of new moles or changes in moles, especially if there is a change in shape or color. Also notify your caregiver if a mole is larger than the size of a pencil eraser.  Stay current with your immunizations. Document Released: 12/03/2010 Document Revised: 08/12/2011 Document Reviewed: 12/03/2010 [facility] Patient Information 2014 [place], [place].

## 2013-02-16 ENCOUNTER — Other Ambulatory Visit: Payer: Self-pay

## 2013-02-16 DIAGNOSIS — D509 Iron deficiency anemia, unspecified: Secondary | ICD-10-CM

## 2013-02-17 ENCOUNTER — Other Ambulatory Visit (HOSPITAL_BASED_OUTPATIENT_CLINIC_OR_DEPARTMENT_OTHER): Payer: BC Managed Care – PPO | Admitting: Lab

## 2013-02-17 DIAGNOSIS — D509 Iron deficiency anemia, unspecified: Secondary | ICD-10-CM

## 2013-02-17 LAB — CBC WITH DIFFERENTIAL/PLATELET
Eosinophils Absolute: 0.1 10*3/uL (ref 0.0–0.5)
HCT: 39.2 % (ref 34.8–46.6)
HGB: 13.2 g/dL (ref 11.6–15.9)
LYMPH%: 31 % (ref 14.0–49.7)
MONO#: 0.3 10*3/uL (ref 0.1–0.9)
NEUT#: 3.6 10*3/uL (ref 1.5–6.5)
NEUT%: 60.9 % (ref 38.4–76.8)
Platelets: 256 10*3/uL (ref 145–400)
WBC: 5.8 10*3/uL (ref 3.9–10.3)

## 2013-02-18 LAB — FERRITIN CHCC: Ferritin: 160 ng/ml (ref 9–269)

## 2013-02-18 LAB — IRON AND TIBC CHCC: %SAT: 15 % — ABNORMAL LOW (ref 21–57)

## 2013-04-08 ENCOUNTER — Other Ambulatory Visit: Payer: Self-pay

## 2013-05-18 ENCOUNTER — Ambulatory Visit (HOSPITAL_BASED_OUTPATIENT_CLINIC_OR_DEPARTMENT_OTHER): Payer: BC Managed Care – PPO | Admitting: Internal Medicine

## 2013-05-18 ENCOUNTER — Telehealth: Payer: Self-pay | Admitting: Internal Medicine

## 2013-05-18 ENCOUNTER — Other Ambulatory Visit (HOSPITAL_BASED_OUTPATIENT_CLINIC_OR_DEPARTMENT_OTHER): Payer: BC Managed Care – PPO

## 2013-05-18 VITALS — BP 136/98 | HR 86 | Temp 98.5°F | Resp 20 | Ht <= 58 in | Wt 244.9 lb

## 2013-05-18 DIAGNOSIS — Z9889 Other specified postprocedural states: Secondary | ICD-10-CM

## 2013-05-18 DIAGNOSIS — I2699 Other pulmonary embolism without acute cor pulmonale: Secondary | ICD-10-CM

## 2013-05-18 DIAGNOSIS — D509 Iron deficiency anemia, unspecified: Secondary | ICD-10-CM

## 2013-05-18 DIAGNOSIS — D500 Iron deficiency anemia secondary to blood loss (chronic): Secondary | ICD-10-CM

## 2013-05-18 DIAGNOSIS — G4733 Obstructive sleep apnea (adult) (pediatric): Secondary | ICD-10-CM

## 2013-05-18 DIAGNOSIS — E669 Obesity, unspecified: Secondary | ICD-10-CM

## 2013-05-18 DIAGNOSIS — N920 Excessive and frequent menstruation with regular cycle: Secondary | ICD-10-CM

## 2013-05-18 DIAGNOSIS — D259 Leiomyoma of uterus, unspecified: Secondary | ICD-10-CM

## 2013-05-18 DIAGNOSIS — D6859 Other primary thrombophilia: Secondary | ICD-10-CM

## 2013-05-18 LAB — COMPREHENSIVE METABOLIC PANEL (CC13)
AST: 17 U/L (ref 5–34)
Albumin: 3.8 g/dL (ref 3.5–5.0)
BUN: 12.8 mg/dL (ref 7.0–26.0)
Calcium: 9.4 mg/dL (ref 8.4–10.4)
Chloride: 101 mEq/L (ref 98–109)
Potassium: 4.1 mEq/L (ref 3.5–5.1)
Sodium: 137 mEq/L (ref 136–145)
Total Protein: 7.8 g/dL (ref 6.4–8.3)

## 2013-05-18 LAB — CBC WITH DIFFERENTIAL/PLATELET
Basophils Absolute: 0.1 10*3/uL (ref 0.0–0.1)
EOS%: 1.3 % (ref 0.0–7.0)
Eosinophils Absolute: 0.1 10*3/uL (ref 0.0–0.5)
HGB: 13.9 g/dL (ref 11.6–15.9)
MCH: 28.6 pg (ref 25.1–34.0)
NEUT#: 5.5 10*3/uL (ref 1.5–6.5)
RBC: 4.86 10*6/uL (ref 3.70–5.45)
RDW: 13.7 % (ref 11.2–14.5)
lymph#: 1.9 10*3/uL (ref 0.9–3.3)

## 2013-05-18 LAB — IRON AND TIBC CHCC
%SAT: 17 % — ABNORMAL LOW (ref 21–57)
UIBC: 321 ug/dL (ref 120–384)

## 2013-05-18 LAB — FOLATE: Folate: >20 ng/mL

## 2013-05-18 NOTE — Patient Instructions (Signed)
Iron tablets, capsules, extended-release tablets What is this medicine? IRON (AHY ern) replaces iron that is essential to healthy red blood cells. Iron is used to treat iron deficiency anemia. Anemia may cause problems like tiredness, shortness of breath, or slowed growth in children. Only take iron if your doctor has told you to. Do not treat yourself with iron if you are feeling tired. Most healthy people get enough iron in their diets, particularly if they eat cereals, meat, poultry, and fish. This medicine may be used for other purposes; ask your health care provider or pharmacist if you have questions. COMMON BRAND NAME(S): Bifera, Duofer, Feosol Complete, Feosol Natural Release, Feosol, Feratab, Ferate , Fergon, Fero-Grad 500, Ferretts, Ferrimin , Ferro-Sequels, Hemocyte, Nephro-Fer, NovaFerrum, ProFe, Slow Fe, Slow Iron , Tandem What should I tell my health care provider before I take this medicine? They need to know if you have any of these conditions: -frequently drink alcohol -bowel disease -hemolytic anemia -iron overload (hemochromatosis, hemosiderosis) -liver disease -problems with swallowing -stomach ulcer or other stomach problems -an unusual or allergic reaction to iron, other medicines, foods, dyes, or preservatives -pregnant or trying to get pregnant -breast-feeding How should I use this medicine? Take this medicine by mouth with a glass of water or fruit juice. Follow the directions on the prescription label. Swallow whole. Do not crush or chew. Take this medicine in an upright or sitting position. Try to take any bedtime doses at least 10 minutes before lying down. You may take this medicine with food. Take your medicine at regular intervals. Do not take your medicine more often than directed. Do not stop taking except on your doctor's advice. Talk to your pediatrician regarding the use of this medicine in children. While this drug may be prescribed for selected conditions,  precautions do apply. Overdosage: If you think you have taken too much of this medicine contact a poison control center or emergency room at once. NOTE: This medicine is only for you. Do not share this medicine with others. What if I miss a dose? If you miss a dose, take it as soon as you can. If it is almost time for your next dose, take only that dose. Do not take double or extra doses. What may interact with this medicine? If you are taking this iron product, you should not take iron in any other medicine or dietary supplement. This medicine may also interact with the following medications: -alendronate -antacids -cefdinir -chloramphenicol -cholestyramine -deferoxamine -dimercaprol -etidronate -medicines for stomach ulcers or other stomach problems -pancreatic enzymes -quinolone antibiotics (examples: Cipro, Floxin, Levaquin, Tequin and others) -risedronate -tetracycline antibiotics (examples: doxycycline, tetracycline, minocycline, and others) -thyroid hormones This list may not describe all possible interactions. Give your health care provider a list of all the medicines, herbs, non-prescription drugs, or dietary supplements you use. Also tell them if you smoke, drink alcohol, or use illegal drugs. Some items may interact with your medicine. What should I watch for while using this medicine? Use iron supplements only as directed by your health care professional. You will need important blood work while you are taking this medicine. It may take 3 to 6 months of therapy to treat low iron levels. Pregnant women should follow the dose and length of iron treatment as directed by their doctors. Do not use iron longer than prescribed, and do not take a higher dose than recommended. Long-term use may cause excess iron to build-up in the body. Do not take iron with antacids. If you need   to take an antacid, take it 2 hours after a dose of iron. What side effects may I notice from receiving this  medicine? Side effects that you should report to your doctor or health care professional as soon as possible: -allergic reactions like skin rash, itching or hives, swelling of the face, lips, or tongue -blue lips, nails, or palms -dark colored stools (this may be due to the iron, but can indicate a more serious condition) -drowsiness -pain with or difficulty swallowing -pale or clammy skin -seizures -stomach pain -unusually weak or tired -vomiting -weak, fast, or irregular heartbeat Side effects that usually do not require medical attention (report to your doctor or health care professional if they continue or are bothersome): -constipation -indigestion -nausea or stomach upset This list may not describe all possible side effects. Call your doctor for medical advice about side effects. You may report side effects to FDA at 1-800-FDA-1088. Where should I keep my medicine? Keep out of the reach of children. Even small amounts of iron can be harmful to a child. Store at room temperature between 15 and 30 degrees C (59 and 86 degrees F). Keep container tightly closed. Throw away any unused medicine after the expiration date. NOTE: This sheet is a summary. It may not cover all possible information. If you have questions about this medicine, talk to your doctor, pharmacist, or health care provider.  2014, Elsevier/Gold Standard. (2007-10-06 17:03:41) Ferritin This is done to test for anemia. Anemia occurs when the amount of hemoglobin (found in the red blood cells) drops below normal. Hemoglobin is necessary for the transportation of oxygen throughout the body. Blood tests may show a variety of common, treatable abnormalities that can lead to problems associated with anemia. Iron deficiency anemia is the most common of the anemias. It is usually due to bleeding. In women, iron deficiency may be due to heavy menstrual periods. In older women and in men, the bleeding is usually from disease of the  intestines. In children and in pregnant women, the body needs more iron. Iron deficiency may be due to simply not eating enough iron in the diet. Iron deficiency may also result from some extreme diets. Treatment of iron deficiency usually involves iron supplements. In older women and in men, there is usually some further testing needed to determine why the person is iron deficient.  PREPARATION FOR TEST A blood sample is obtained by inserting a needle into a vein in the arm. NORMAL FINDINGS Female: 12-300ng/ml or 12-300  g/L (SI units) Female:  10-150 ng/ml or 10-150  g/L (SI units) Children/adolescent:  Newborn: 25-200 ng/ml  1 month: 200-600 ng/ml  2-5 months: 50-200 ng/ml  6 months-15 years: 7-142 ng/ml Ranges for normal findings may vary among different laboratories and hospitals. You should always check with your doctor after having lab work or other tests done to discuss the meaning of your test results and whether your values are considered within normal limits. MEANING OF TEST  Your caregiver will go over the test results with you and discuss the importance and meaning of your results, as well as treatment options and the need for additional tests if necessary. OBTAINING THE TEST RESULTS  It is your responsibility to obtain your test results. Ask the lab or department performing the test when and how you will get your results. Document Released: 06/12/2004 Document Revised: 08/12/2011 Document Reviewed: 04/29/2008 [facility] Patient Information 2014 [place], [place]. Iron Deficiency Anemia There are many types of anemia. Iron deficiency anemia is  the most common. Iron deficiency anemia is a decrease in the number of red blood cells caused by too little iron. Without enough iron, your body does not produce enough hemoglobin. Hemoglobin is a substance in red blood cells that carries oxygen to the body's tissues. Iron deficiency anemia may leave you tired and short of breath. CAUSES    Lack of iron in the diet.  This may be seen in infants and children, because there is little iron in milk.  This may be seen in adults who do not eat enough iron-rich foods.  This may be seen in pregnant or breastfeeding women who do not take iron supplements. There is a much higher need for iron intake at these times.  Poor absorption of iron, as seen with intestinal disorders.  Intestinal bleeding.  Heavy periods. SYMPTOMS  Mild anemia may not be noticeable. Symptoms may include:  Fatigue.  Headache.  Pale skin.  Weakness.  Shortness of breath.  Dizziness.  Cold hands and feet.  Fast or irregular heartbeat. DIAGNOSIS  Diagnosis requires a thorough evaluation and physical exam by your caregiver.  Blood tests are generally used to confirm iron deficiency anemia.  Additional tests may be done to find the underlying cause of your anemia. These may include:  Testing for blood in the stool (fecal occult blood test).  A procedure to see inside the colon and rectum (colonoscopy).  A procedure to see inside the esophagus and stomach (endoscopy). TREATMENT   Correcting the cause of the iron deficiency is the first step.  Medicines, such as oral contraceptives, can make heavy menstrual flows lighter.  Antibiotics and other medicines can be used to treat peptic ulcers.  Surgery may be needed to remove a bleeding polyp, tumor, or fibroid.  Often, iron supplements (ferrous sulfate) are taken.  For the best iron absorption, take these supplements with an empty stomach.  You may need to take the supplements with food if you cannot tolerate them on an empty stomach. Vitamin C improves the absorption of iron. Your caregiver may recommend taking your iron tablets with a glass of orange juice or vitamin C supplement.  Milk and antacids should not be taken at the same time as iron supplements. They may interfere with the absorption of iron.  Iron supplements can cause  constipation. A stool softener is often recommended.  Pregnant and breastfeeding women will need to take extra iron, because their normal diet usually will not provide the required amount.  Patients who cannot tolerate iron by mouth can take it through a vein (intravenously) or by an injection into the muscle. HOME CARE INSTRUCTIONS   Ask your dietitian for help with diet questions.  Take iron and vitamins as directed by your caregiver.  Eat a diet rich in iron. Eat liver, lean beef, whole-grain bread, eggs, dried fruit, and dark green leafy vegetables. SEEK IMMEDIATE MEDICAL CARE IF:   You have a fainting episode. Do not drive yourself. Call your local emergency services (911 in [place] if no other help is available.  You have chest pain, nausea, or vomiting.  You develop severe or increased shortness of breath with activities.  You develop weakness or increased thirst.  You have a rapid heartbeat.  You develop unexplained sweating or become lightheaded when getting up from a chair or bed. MAKE SURE YOU:   Understand these instructions.  Will watch your condition.  Will get help right away if you are not doing well or get worse. Document Released: 05/17/2000  Document Revised: 08/12/2011 Document Reviewed: 09/26/2009 [facility] Patient Information 2014 [place], [place].

## 2013-05-18 NOTE — Telephone Encounter (Signed)
appts made per 12/16 POF AVS and CAL given shh °

## 2013-05-19 NOTE — Progress Notes (Signed)
[facility] OFFICE PROGRESS NOTE  Rene Paci, MD [address] [facility] [address] [place] [address]  DIAGNOSIS: ANEMIA-IRON DEFICIENCY - Plan: CBC with Differential, CBC with Differential, Ferritin, Ferritin, Iron and TIBC, Iron and TIBC  S/P myomectomy  OBSTRUCTIVE SLEEP APNEA  PULMONARY EMBOLISM  Uterine myoma  OBESITY  Chief Complaint  Patient presents with  . ANEMIA-IRON DEFICIENCY    CURRENT THERAPY: FeSO4 bid.  Feraheme prn.   INTERVAL HISTORY: [name] 37 y.o. female with a history of iron-deficiency anemia secondary to uterine bleeding is here for follow-up.  She reports her LMP was on 12/05 with heavy flow for about 7 days.  Her menses is usually every 14-21 days.  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  She was last seen by Dr. Vedia Pereyra on 11/16/2012.  At times, she reports decrease energy and fatigue.  She works part-time as a Environmental manager.  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  Duplex of lower extremity were negative.  PE was in August 2010.  She continues to take ferrous sulfate 325 mg twice daily.  She reports heavy periods as noted above.    MEDICAL HISTORY: Past Medical History  Diagnosis Date  . OBESITY   . 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 POLYCYSTIC OVARIAN DISEASE   . OBSTRUCTIVE SLEEP APNEA   . ANEMIA-IRON DEFICIENCY   . 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 Prothrombin mutation     heterozygous prothrombin II mutation  . Allergy     seasonal allergies  . 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    ALLERGIES:  is allergic to oxycodone; gluten; and betadine.  MEDICATIONS: has a current medication list which includes the following prescription(s): activated charcoal, epinephrine, ferrous sulfate, multivitamin, and vitamin c.  SURGICAL HISTORY:  Past Surgical History  Procedure Laterality Date  . Myomectomy abdominal approach  1997    No complications  . Myomectomy  02/12/2011    Procedure: MYOMECTOMY;  Surgeon: Turner Daniels, MD;  Location: [facility];  Service: Gynecology;  Laterality: N/A;  . Colostomy  03/2011    Dr. Luisa Hart  . Colostomy reversal     PROBLEM LIST:  1. Bilateral pulmonary emboli on 01/31/2009. The patient was on a NuvaRing at that time. Doppler studies of the legs were negative for DVT.  2. 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  3. Polycystic ovary disease.  4. Obesity.  5. Sleep apnea.  6. Uterine fibroids and menorrhagia.  7. 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 Resolved.  8. Diverting loop sigmoid colostomy created on 03/27/2011 with reversal carried out during the late spring or early summer of 2013.  9. Anticoagulation therapy was discontinued in August 2013 at the recommendation of Dr. Cherie Dark from [facility].  10. Iron deficiency anemia secondary to excessive menstrual bleeding. A von  Willebrand's panel carried out on 11/21/2009 was negative. A celiac panel carried out on 03/06/2012 was negative. Patient received IV Feraheme 510 mg on 11/20/2011 and 11/27/2011.  REVIEW OF SYSTEMS:   Constitutional: Denies fevers, chills or abnormal weight loss Eyes: Denies blurriness of vision Ears, nose, mouth, throat, and face: Denies mucositis or sore throat Respiratory: Denies cough, dyspnea or  wheezes Cardiovascular: Denies palpitation, chest discomfort or lower extremity swelling Gastrointestinal:  Denies nausea, heartburn or change in bowel habits Skin: Denies abnormal skin rashes Lymphatics: Denies new lymphadenopathy or easy bruising Neurological:Denies numbness, tingling or new weaknesses Behavioral/Psych: Mood is stable, no new changes  All other systems were reviewed with the patient and are negative.  PHYSICAL EXAMINATION: ECOG PERFORMANCE STATUS: 0 - Asymptomatic  Blood pressure 136/98, pulse 86, temperature 98.5 F (36.9 C), temperature source Oral, resp. rate 20, height 4\' 10"  (1.473 m), weight 244 lb 14.4 oz (111.086 kg), SpO2 100.00%.  GENERAL:alert, no distress and comfortable; morbidly obese  SKIN: skin color, texture, turgor are normal, no rashes or significant lesions EYES: normal, Conjunctiva are pink and non-injected, sclera clear OROPHARYNX:no exudate, no erythema and lips, buccal mucosa, and tongue normal  NECK: supple, thyroid normal size, non-tender, without nodularity LYMPH:  no palpable lymphadenopathy in the cervical, axillary or supraclavicular LUNGS: clear to auscultation and percussion with normal breathing effort HEART: regular rate & rhythm and no murmurs and no lower extremity edema ABDOMEN:abdomen soft, non-tender and normal bowel sounds; obese. Surgical scars well healed.  Musculoskeletal:no cyanosis of digits and no clubbing  NEURO: alert & oriented x 3 with fluent speech, no focal motor/sensory deficits  LABORATORY DATA: Results for orders placed in visit on 05/18/13 (from the past 48 hour(s))  CBC WITH DIFFERENTIAL     Status: None   Collection Time    05/18/13  2:34 PM      Result Value Range   WBC 7.8  3.9 - 10.3 10e3/uL   NEUT# 5.5  1.5 - 6.5 10e3/uL   HGB 13.9  11.6 - 15.9 g/dL   HCT 62.1  30.8 - 65.7 %   Platelets 268  145 - 400 10e3/uL   MCV 83.3  79.5 - 101.0 fL   MCH 28.6  25.1 - 34.0 pg   MCHC 34.4  31.5 - 36.0 g/dL    RBC 8.46  9.62 - 9.52 10e6/uL   RDW 13.7  11.2 - 14.5 %   lymph# 1.9  0.9 - 3.3 10e3/uL   MONO# 0.3  0.1 - 0.9 10e3/uL   Eosinophils Absolute 0.1  0.0 - 0.5 10e3/uL   Basophils Absolute 0.1  0.0 - 0.1 10e3/uL   NEUT% 69.8  38.4 - 76.8 %   LYMPH% 24.6  14.0 - 49.7 %   MONO% 3.6  0.0 - 14.0 %   EOS% 1.3  0.0 - 7.0 %   BASO% 0.7  0.0 - 2.0 %  IRON AND TIBC [facility]     Status: Abnormal   Collection Time    05/18/13  2:34 PM      Result Value Range   Iron 67  41 - 142 ug/dL   TIBC 841  324 - 401 ug/dL   UIBC 027  253 - 664 ug/dL   %SAT 17 (*) 21 - 57 %  COMPREHENSIVE METABOLIC PANEL ([facility]13)     Status: Abnormal   Collection Time    05/18/13  2:34 PM      Result Value Range   Sodium 137  136 -  145 mEq/L   Potassium 4.1  3.5 - 5.1 mEq/L   Chloride 101  98 - 109 mEq/L   CO2 24  22 - 29 mEq/L   Glucose 98  70 - 140 mg/dl   BUN 16.1  7.0 - 09.6 mg/dL   Creatinine 0.7  0.6 - 1.1 mg/dL   Total Bilirubin 0.45  0.20 - 1.20 mg/dL   Alkaline Phosphatase 112  40 - 150 U/L   AST 17  5 - 34 U/L   ALT 16  0 - 55 U/L   Total Protein 7.8  6.4 - 8.3 g/dL   Albumin 3.8  3.5 - 5.0 g/dL   Calcium 9.4  8.4 - 40.9 mg/dL   Anion Gap 12 (*) 3 - 11 mEq/L  FERRITIN [facility]     Status: None   Collection Time    05/18/13  2:35 PM      Result Value Range   Ferritin 207  9 - 269 ng/ml  FOLATE     Status: None   Collection Time    05/18/13  2:35 PM      Result Value Range   Folate >20.0     Comment:  Reference Ranges        Deficient:       0.4 - 3.3 ng/mL        Indeterminate:   3.4 - 5.4 ng/mL        Normal:              > 5.4 ng/mL      Labs:  Lab Results  Component Value Date   WBC 7.8 05/18/2013   HGB 13.9 05/18/2013   HCT 40.5 05/18/2013   MCV 83.3 05/18/2013   PLT 268 05/18/2013   NEUTROABS 5.5 05/18/2013      Chemistry      Component Value Date/Time   NA 137 05/18/2013 1434   NA 138 01/02/2013 2348   K 4.1 05/18/2013 1434   K 3.6 01/02/2013 2348   CL 103 01/02/2013 2348   CL 104 11/16/2012  1348   CO2 24 05/18/2013 1434   CO2 26 01/17/2012 1343   BUN 12.8 05/18/2013 1434   BUN 11 01/02/2013 2348   CREATININE 0.7 05/18/2013 1434   CREATININE 0.90 01/02/2013 2348      Component Value Date/Time   CALCIUM 9.4 05/18/2013 1434   CALCIUM 9.1 01/17/2012 1343   ALKPHOS 112 05/18/2013 1434   ALKPHOS 83 01/17/2012 1343   AST 17 05/18/2013 1434   AST 15 01/17/2012 1343   ALT 16 05/18/2013 1434   ALT 15 01/17/2012 1343   BILITOT 0.49 05/18/2013 1434   BILITOT 0.3 01/17/2012 1343     Basic Metabolic Panel:  Recent Labs Lab 05/18/13 1434  NA 137  K 4.1  CO2 24  GLUCOSE 98  BUN 12.8  CREATININE 0.7  CALCIUM 9.4   GFR Estimated Creatinine Clearance: 102.8 ml/min (by C-G formula based on Cr of 0.7). Liver Function Tests:  Recent Labs Lab 05/18/13 1434  AST 17  ALT 16  ALKPHOS 112  BILITOT 0.49  PROT 7.8  ALBUMIN 3.8   CBC:  Recent Labs Lab 05/18/13 1434  WBC 7.8  NEUTROABS 5.5  HGB 13.9  HCT 40.5  MCV 83.3  PLT 268   Anemia work up  Recent Labs  05/18/13 1434 05/18/13 1435  FOLATE  --  >20.0  FERRITIN  --  207  TIBC 389  --   IRON 67  --  IMAGING STUDIES:  1. CT scan of the abdomen and pelvis with IV contrast carried out on 02/20/2011 showed abscess collection identified superior to the uterus extending along the left lateral aspect, 10.2 x 4.2 x 5 cm  in size, appearing to extend into the left lateral aspect of the uterus and quest onably site of myomectomy. There was additional gas collection seen in the anterior abdominal wall fascial planes.  There was rounded focal fluid collection with enhancing wall in the region of the left adnexa, possibly an ovarian cyst. There was an open ventral wound anterior abdominal wall of pelvis.  2. CT scan of the abdomen and pelvis with IV contrast on 03/12/2011 showed persistent fistulous tract from the sigmoid colon to the anterior abdominal wall, slight interval decrease in the size of the abscess just superior to  the uterus, and stable surgical changes involving the uterus.  3. CT scan of the abdomen and pelvis without IV contrast on 05/09/2011 showed diverting loop colostomy in the left lower quadrant with no complicating features. There was a tiny residual colocutaneous fistula. There was minimal residual subcutaneous and anterior abdominal wall inflammation, but no abscess.  4. CT scan of the abdomen and pelvis with IV contrast on 06/27/2011 showed left lower quadrant loop colostomy with no extravasation of contrast, although the proximal sigmoid is suboptimally opacified.  The appearance suggested the closure of the fistula that was seen on the study of 05/09/2011.   PROCEDURES:  1. Flexible sigmoidoscopy with submucosal injections on 09/10/2011. At 35 cm from the anus, there was a site of injured colon, hypertrophied mucosa. Abutting this site directly, the lumen of the colon was quite narrowed and the scope could not be passed through it. This area was labeled with [place] ink injection.  2. EGD with duodenal biopsies to check for celiac sprue were carried out. The EGD was normal.  ASSESSMENT: Bradly Bienenstock 37 y.o. female with a history of ANEMIA-IRON DEFICIENCY - Plan: CBC with Differential, CBC with Differential, Ferritin, Ferritin, Iron and TIBC, Iron and TIBC  S/P myomectomy  OBSTRUCTIVE SLEEP APNEA  PULMONARY EMBOLISM  Uterine myoma  OBESITY   PLAN:   1. IDA secondary menorrhagia and metrorrhagia.  --Patient is doing well today.  Her iron indices appear to be within normal limits.  --Repeat labs including iron level, ferritin and CBC and chemistries in 3 months and upon follow-up in 6 months. Patient aware to report sooner for increasing symptoms of anemia such as doe, palpitations.  She was provided a handout on anemia.  --Continue FeSO4 bid.   Follow-up with Gyn.    2. History of Hetrozygous Prothrombin II gene mutation complicated by PE (bilateral) while on OCP.  --Seen by Dr. Isaiah Serge  at [facility] who recommended against further a/c.  Patient counseled to take a/c for related to increased risk procedures, prolonged immobilization or hormonal  therapy which may increase her risks of clotting.   3. Morbid obesity. --She is discussing nutrition, diet and exercise with her PCP.   4. Follow-up. --We will check CBC and iron studies in 3 months and plan to see her again in 6 months, at which time we will check CBC, chemistries and iron studies.    All questions were answered. The patient knows to call the clinic with any problems, questions or concerns. We can certainly see the patient much sooner if necessary.  I spent 15 minutes counseling the patient face to face. The total time spent in the appointment was 25 minutes.  Deeana Atwater, MD 05/19/2013 4:22 PM

## 2013-07-21 ENCOUNTER — Encounter: Payer: Self-pay | Admitting: Internal Medicine

## 2013-07-21 ENCOUNTER — Ambulatory Visit (INDEPENDENT_AMBULATORY_CARE_PROVIDER_SITE_OTHER): Payer: BC Managed Care – PPO | Admitting: Internal Medicine

## 2013-07-21 VITALS — BP 112/72 | HR 82 | Temp 99.8°F | Wt 248.4 lb

## 2013-07-21 DIAGNOSIS — E669 Obesity, unspecified: Secondary | ICD-10-CM

## 2013-07-21 DIAGNOSIS — Z0000 Encounter for general adult medical examination without abnormal findings: Secondary | ICD-10-CM

## 2013-07-21 DIAGNOSIS — D509 Iron deficiency anemia, unspecified: Secondary | ICD-10-CM

## 2013-07-21 DIAGNOSIS — N644 Mastodynia: Secondary | ICD-10-CM

## 2013-07-21 DIAGNOSIS — J329 Chronic sinusitis, unspecified: Secondary | ICD-10-CM

## 2013-07-21 MED ORDER — LEVOFLOXACIN 500 MG PO TABS: 500.0000 mg | ORAL_TABLET | Freq: Every day | ORAL | Status: DC

## 2013-07-21 MED ORDER — TRIAMCINOLONE ACETONIDE 55 MCG/ACT NA AERO: 2.0000 | INHALATION_SPRAY | Freq: Every day | NASAL | Status: AC

## 2013-07-21 NOTE — Patient Instructions (Addendum)
It was good to see you today.  We have reviewed your prior records including labs and tests today  Health Maintenance reviewed - all recommended immunizations and age-appropriate screenings are up-to-date.  Test(s) ordered today. Your results will be released to [place] (or called to you) after review, usually within 72hours after test completion. If any changes need to be made, you will be notified at that same time.  Medications reviewed and updated, Levaquin daily x 10days for sinus and continue Nasacort -no other changes recommended at this time.  [facility]. Our office will contact you regarding appointment(s) once made.  Please schedule followup in 12 months for annual exam and labs, call sooner if problems.  Health Maintenance, Female A healthy lifestyle and preventative care can promote health and wellness.  Maintain regular health, dental, and eye exams.  Eat a healthy diet. Foods like vegetables, fruits, whole grains, low-fat dairy products, and lean protein foods contain the nutrients you need without too many calories. Decrease your intake of foods high in solid fats, added sugars, and salt. Get information about a proper diet from your caregiver, if necessary.  Regular physical exercise is one of the most important things you can do for your health. Most adults should get at least 150 minutes of moderate-intensity exercise (any activity that increases your heart rate and causes you to sweat) each week. In addition, most adults need muscle-strengthening exercises on 2 or more days a week.   Maintain a healthy weight. The body mass index (BMI) is a screening tool to identify possible weight problems. It provides an estimate of body fat based on height and weight. Your caregiver can help determine your BMI, and can help you achieve or maintain a healthy weight. For adults 20 years and older:  A BMI below 18.5 is considered  underweight.  A BMI of 18.5 to 24.9 is normal.  A BMI of 25 to 29.9 is considered overweight.  A BMI of 30 and above is considered obese.  Maintain normal blood lipids and cholesterol by exercising and minimizing your intake of saturated fat. Eat a balanced diet with plenty of fruits and vegetables. Blood tests for lipids and cholesterol should begin at age 15 and be repeated every 5 years. If your lipid or cholesterol levels are high, you are over 50, or you are a high risk for heart disease, you may need your cholesterol levels checked more frequently.Ongoing high lipid and cholesterol levels should be treated with medicines if diet and exercise are not effective.  If you smoke, find out from your caregiver how to quit. If you do not use tobacco, do not start.  Lung cancer screening is recommended for adults aged 69 80 years who are at high risk for developing lung cancer because of a history of smoking. Yearly low-dose computed tomography (CT) is recommended for people who have at least a 30-pack-year history of smoking and are a current smoker or have quit within the past 15 years. A pack year of smoking is smoking an average of 1 pack of cigarettes a day for 1 year (for example: 1 pack a day for 30 years or 2 packs a day for 15 years). Yearly screening should continue until the smoker has stopped smoking for at least 15 years. Yearly screening should also be stopped for people who develop a health problem that would prevent them from having lung cancer treatment.  If you are pregnant, do not drink  alcohol. If you are breastfeeding, be very cautious about drinking alcohol. If you are not pregnant and choose to drink alcohol, do not exceed 1 drink per day. One drink is considered to be 12 ounces (355 mL) of beer, 5 ounces (148 mL) of wine, or 1.5 ounces (44 mL) of liquor.  Avoid use of street drugs. Do not share needles with anyone. Ask for help if you need support or instructions about stopping  the use of drugs.  High blood pressure causes heart disease and increases the risk of stroke. Blood pressure should be checked at least every 1 to 2 years. Ongoing high blood pressure should be treated with medicines, if weight loss and exercise are not effective.  If you are 20 to 40 years old, ask your caregiver if you should take aspirin to prevent strokes.  Diabetes screening involves taking a blood sample to check your fasting blood sugar level. This should be done once every 3 years, after age 59, if you are within normal weight and without risk factors for diabetes. Testing should be considered at a younger age or be carried out more frequently if you are overweight and have at least 1 risk factor for diabetes.  Breast cancer screening is essential preventative care for women. You should practice "breast self-awareness." This means understanding the normal appearance and feel of your breasts and may include breast self-examination. Any changes detected, no matter how small, should be reported to a caregiver. Women in their 10s and 30s should have a clinical breast exam (CBE) by a caregiver as part of a regular health exam every 1 to 3 years. After age 95, women should have a CBE every year. Starting at age 4, women should consider having a mammogram (breast X-ray) every year. Women who have a family history of breast cancer should talk to their caregiver about genetic screening. Women at a high risk of breast cancer should talk to their caregiver about having an MRI and a mammogram every year.  Breast cancer gene (BRCA)-related cancer risk assessment is recommended for women who have family members with BRCA-related cancers. BRCA-related cancers include breast, ovarian, tubal, and peritoneal cancers. Having family members with these cancers may be associated with an increased risk for harmful changes (mutations) in the breast cancer genes BRCA1 and BRCA2. Results of the assessment will determine  the need for genetic counseling and BRCA1 and BRCA2 testing.  The Pap test is a screening test for cervical cancer. Women should have a Pap test starting at age 30. Between ages 12 and 34, Pap tests should be repeated every 2 years. Beginning at age 56, you should have a Pap test every 3 years as long as the past 3 Pap tests have been normal. If you had a hysterectomy for a problem that was not cancer or a condition that could lead to cancer, then you no longer need Pap tests. If you are between ages 34 and 57, and you have had normal Pap tests going back 10 years, you no longer need Pap tests. If you have had past treatment for cervical cancer or a condition that could lead to cancer, you need Pap tests and screening for cancer for at least 20 years after your treatment. If Pap tests have been discontinued, risk factors (such as a new sexual partner) need to be reassessed to determine if screening should be resumed. Some women have medical problems that increase the chance of getting cervical cancer. In these cases, your caregiver may  recommend more frequent screening and Pap tests.  The human papillomavirus (HPV) test is an additional test that may be used for cervical cancer screening. The HPV test looks for the virus that can cause the cell changes on the cervix. The cells collected during the Pap test can be tested for HPV. The HPV test could be used to screen women aged 81 years and older, and should be used in women of any age who have unclear Pap test results. After the age of 43, women should have HPV testing at the same frequency as a Pap test.  Colorectal cancer can be detected and often prevented. Most routine colorectal cancer screening begins at the age of 2 and continues through age 35. However, your caregiver may recommend screening at an earlier age if you have risk factors for colon cancer. On a yearly basis, your caregiver may provide home test kits to check for hidden blood in the stool.  Use of a small camera at the end of a tube, to directly examine the colon (sigmoidoscopy or colonoscopy), can detect the earliest forms of colorectal cancer. Talk to your caregiver about this at age 90, when routine screening begins. Direct examination of the colon should be repeated every 5 to 10 years through age 76, unless early forms of pre-cancerous polyps or small growths are found.  Hepatitis C blood testing is recommended for all people born from [birth year] through 1967 and any individual with known risks for hepatitis C.  Practice safe sex. Use condoms and avoid high-risk sexual practices to reduce the spread of sexually transmitted infections (STIs). Sexually active women aged 42 and younger should be checked for Chlamydia, which is a common sexually transmitted infection. Older women with new or multiple partners should also be tested for Chlamydia. Testing for other STIs is recommended if you are sexually active and at increased risk.  Osteoporosis is a disease in which the bones lose minerals and strength with aging. This can result in serious bone fractures. The risk of osteoporosis can be identified using a bone density scan. Women ages 22 and over and women at risk for fractures or osteoporosis should discuss screening with their caregivers. Ask your caregiver whether you should be taking a calcium supplement or vitamin D to reduce the rate of osteoporosis.  Menopause can be associated with physical symptoms and risks. Hormone replacement therapy is available to decrease symptoms and risks. You should talk to your caregiver about whether hormone replacement therapy is right for you.  Use sunscreen. Apply sunscreen liberally and repeatedly throughout the day. You should seek shade when your shadow is shorter than you. Protect yourself by wearing long sleeves, pants, a wide-brimmed hat, and sunglasses year round, whenever you are outdoors.  Notify your caregiver of new moles or changes in moles,  especially if there is a change in shape or color. Also notify your caregiver if a mole is larger than the size of a pencil eraser.  Stay current with your immunizations. Document Released: 12/03/2010 Document Revised: 09/14/2012 Document Reviewed: 12/03/2010 [facility] Patient Information 2014 [place].

## 2013-07-21 NOTE — Progress Notes (Signed)
Subjective:    Patient ID: [name], female    DOB: [birth year], 38 y.o.   MRN: [id number]  HPI  patient is here today for annual physical. Patient feels well and has no complaints.  Also reviewed chronic medical issues and interval medical events  Also concerned about sinus symptoms > 2 weeks and R breast pain (see ROS below)  Past Medical History  Diagnosis Date  . OBESITY   . 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 POLYCYSTIC OVARIAN DISEASE   . OBSTRUCTIVE SLEEP APNEA   . ANEMIA-IRON DEFICIENCY   . 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 Prothrombin mutation     heterozygous prothrombin II mutation  . Allergy     seasonal allergies  . Anxiety    Family History  Problem Relation Age of Onset  . Allergies Mother   . Breast cancer Mother 64  . Asthma Paternal Grandmother   . Emphysema Paternal Grandfather   . Colon cancer Neg Hx   . Diabetes Maternal Grandmother   . Dementia Father    History  Substance Use Topics  . Smoking status: Never Smoker   . Smokeless tobacco: Never Used  . Alcohol Use: No    Review of Systems  Constitutional: Positive for fever (LGF), chills and fatigue. Negative for unexpected weight change.  HENT: Positive for congestion, postnasal drip, rhinorrhea, sinus pressure and sore throat. Negative for dental problem, ear pain, sneezing, tinnitus and trouble swallowing.   Respiratory: Negative for cough, shortness of breath and wheezing.   Cardiovascular: Negative for chest pain, palpitations and leg swelling.  Gastrointestinal: Negative for nausea, abdominal pain and diarrhea.  Skin: Negative for rash and wound.       R lateral breast tenderness daily >4 weeks - no trauma or rash - "same sx as my mom before they dx breast cancer when i was born (age 86)"  Neurological: Negative for  dizziness, weakness, light-headedness and headaches.  Psychiatric/Behavioral: Negative for dysphoric mood. The patient is not nervous/anxious.   All other systems reviewed and are negative.       Objective:   Physical Exam  BP 112/72  Pulse 82  Temp(Src) 99.8 F (37.7 C) (Oral)  Wt 248 lb 6.4 oz (112.674 kg)  SpO2 96% Wt Readings from Last 3 Encounters:  07/21/13 248 lb 6.4 oz (112.674 kg)  05/18/13 244 lb 14.4 oz (111.086 kg)  01/18/13 246 lb 9.6 oz (111.857 kg)   Constitutional: She is obese but appears well-developed and well-nourished. uncomfortable due to sinus pressure but non toxic HENT: Head: Normocephalic and atraumatic.tender to palp over L>R frontal > max sinus region. Ears: B TMs ok, no erythema or effusion; Nose: turbinate swelling L>R with clear DC evident. Mouth/Throat: Oropharynx is clear and moist. No oropharyngeal exudate.  Eyes: Conjunctivae and EOM are normal. Pupils are equal, round, and reactive to light. No scleral icterus.  Neck: Thick. Normal range of motion. Neck supple. No JVD present. No thyromegaly present.  Cardiovascular: Normal rate, regular rhythm and normal heart sounds.  No murmur heard. No BLE edema. Chest wall/breats - deterred today  Pulmonary/Chest: Effort normal and breath sounds normal. No respiratory distress. She has no wheezes.  Abdominal: Soft. Bowel sounds are normal. She exhibits no distension. There is no tenderness. no masses Musculoskeletal: Normal  range of motion, no joint effusions. No gross deformities Neurological: She is alert and oriented to person, place, and time. No cranial nerve deficit. Coordination, balance, strength, speech and gait are normal.  Skin: Skin is warm and dry. No rash noted. No erythema.  Psychiatric: She has a normal mood and affect. Her behavior is normal. Judgment and thought content normal.     Lab Results  Component Value Date   WBC 7.8 05/18/2013   HGB 13.9 05/18/2013   HCT 40.5 05/18/2013   PLT  268 05/18/2013   GLUCOSE 98 05/18/2013   CHOL 219* 07/20/2012   TRIG 96 07/20/2012   HDL 61 07/20/2012   LDLCALC 139* 07/20/2012   ALT 16 05/18/2013   AST 17 05/18/2013   NA 137 05/18/2013   K 4.1 05/18/2013   CL 103 01/02/2013   CREATININE 0.7 05/18/2013   BUN 12.8 05/18/2013   CO2 24 05/18/2013   TSH 0.915 07/20/2012   INR 2.5 02/28/2012   HGBA1C  Value: 5.3 (NOTE) The ADA recommends the following therapeutic goal for glycemic control related to Hgb A1c measurement: Goal of therapy: <6.5 Hgb A1c  Reference: American Diabetes Association: Clinical Practice Recommendations 2010, Diabetes Care, 2010, 33: (Suppl  1). 02/01/2009    US Transvaginal Non-ob  01/03/2013   *RADIOLOGY REPORT*  Clinical Data:  Left lower quadrant pain.  Rule out torsion.  TRANSABDOMINAL AND TRANSVAGINAL ULTRASOUND OF PELVIS DOPPLER ULTRASOUND OF OVARIES  Technique:  Both transabdominal and transvaginal ultrasound examinations of the pelvis were performed. Transabdominal technique was performed for global imaging of the pelvis including uterus, ovaries, adnexal regions, and pelvic cul-de-sac.  It was necessary to proceed with endovaginal exam following the transabdominal exam to visualize the adnexa.  Color and duplex Doppler ultrasound was utilized to evaluate blood flow to the ovaries.  Comparison:  CT abdomen pelvis from the same day.  FINDINGS  Uterus:  The uterus measures 9 x 5 x 6 cm.  There are three heterogeneous masses which are intramural.  The largest is in the posterior uterine body, 2.5 x 1.6 x 1.7 cm.  In the left uterine body is the second largest, 1.9 x 1.8 x 2 cm.  There is a 9 mm mass in the anterior body.  Endometrium:  Homogeneous, 11 mm in thickness.  Right ovary: 2.7 x 1.1 x 1.7 cm.  Left ovary: 4.6 x 4 x 4.4 cm.  Enlargement compared to the right secondary to a 3.7 cm anechoic cyst.  Pulsed Doppler evaluation demonstrates  venous waveforms present in both ovaries.  Normal low resistance arterial wave form  present in the right ovary.  Probable, low resistance arterial wave form within the left ovary, although low amplitude.  The left ovary shows no morphologic features of torsion currently.  IMPRESSION:  1.  Vascular flow present in both ovaries. 2.  3.7 cm left ovarian cyst. 3.  Uterine fibroids, up to 2.5 cm.   Original Report Authenticated By: Jorje Guild   US Pelvis Complete  01/03/2013   *RADIOLOGY REPORT*  Clinical Data:  Left lower quadrant pain.  Rule out torsion.  TRANSABDOMINAL AND TRANSVAGINAL ULTRASOUND OF PELVIS DOPPLER ULTRASOUND OF OVARIES  Technique:  Both transabdominal and transvaginal ultrasound examinations of the pelvis were performed. Transabdominal technique was performed for global imaging of the pelvis including uterus, ovaries, adnexal regions, and pelvic cul-de-sac.  It was necessary to proceed with endovaginal exam following the transabdominal exam to visualize the adnexa.  Color and duplex Doppler ultrasound was  utilized to evaluate blood flow to the ovaries.  Comparison:  CT abdomen pelvis from the same day.  FINDINGS  Uterus:  The uterus measures 9 x 5 x 6 cm.  There are three heterogeneous masses which are intramural.  The largest is in the posterior uterine body, 2.5 x 1.6 x 1.7 cm.  In the left uterine body is the second largest, 1.9 x 1.8 x 2 cm.  There is a 9 mm mass in the anterior body.  Endometrium:  Homogeneous, 11 mm in thickness.  Right ovary: 2.7 x 1.1 x 1.7 cm.  Left ovary: 4.6 x 4 x 4.4 cm.  Enlargement compared to the right secondary to a 3.7 cm anechoic cyst.  Pulsed Doppler evaluation demonstrates  venous waveforms present in both ovaries.  Normal low resistance arterial wave form present in the right ovary.  Probable, low resistance arterial wave form within the left ovary, although low amplitude.  The left ovary shows no morphologic features of torsion currently.  IMPRESSION:  1.  Vascular flow present in both ovaries. 2.  3.7 cm left ovarian cyst. 3.  Uterine  fibroids, up to 2.5 cm.   Original Report Authenticated By: Jorje Guild   Ct Abdomen Pelvis W Contrast  01/03/2013   *RADIOLOGY REPORT*  Clinical Data: 38 year old female with abdominal pain.  Unusual feeling in the area of prior colostomy.  CT ABDOMEN AND PELVIS WITH CONTRAST  Technique:  Multidetector CT imaging of the abdomen and pelvis was performed following the standard protocol during bolus administration of intravenous contrast.  Contrast 100 ml intravenous Omnipaque-300  Comparison: 06/27/2011 and prior CTs  Findings: The liver, spleen, gallbladder, pancreas, adrenal glands and kidneys are unremarkable. Postsurgical changes/scarring in the left anterior lower abdomen/upper pelvis subcutaneous fat is noted in the area of prior ostomy.  No definite acute abnormalities on this area is noted.  The bowel and bladder are unremarkable. A 3.5 x 4 cm left ovarian cyst is identified.  No free fluid, enlarged lymph nodes, biliary dilation or abdominal aortic aneurysm identified. There is no evidence of bowel obstruction or pneumoperitoneum. No acute or suspicious bony abnormalities are identified.  IMPRESSION: Postsurgical scarring in the left anterior lower abdomen/upper pelvis in the area of prior ostomy.  No definite acute abnormality in this area is identified.  3.5 x 4 cm left ovarian cyst.  No other significant abnormalities identified.   Original Report Authenticated By: Margarette Canada, M.D.   [place] Art/ven Flow Abd Pelv Doppler Limited  01/03/2013   *RADIOLOGY REPORT*  Clinical Data:  Left lower quadrant pain.  Rule out torsion.  TRANSABDOMINAL AND TRANSVAGINAL ULTRASOUND OF PELVIS DOPPLER ULTRASOUND OF OVARIES  Technique:  Both transabdominal and transvaginal ultrasound examinations of the pelvis were performed. Transabdominal technique was performed for global imaging of the pelvis including uterus, ovaries, adnexal regions, and pelvic cul-de-sac.  It was necessary to proceed with endovaginal exam following  the transabdominal exam to visualize the adnexa.  Color and duplex Doppler ultrasound was utilized to evaluate blood flow to the ovaries.  Comparison:  CT abdomen pelvis from the same day.  FINDINGS  Uterus:  The uterus measures 9 x 5 x 6 cm.  There are three heterogeneous masses which are intramural.  The largest is in the posterior uterine body, 2.5 x 1.6 x 1.7 cm.  In the left uterine body is the second largest, 1.9 x 1.8 x 2 cm.  There is a 9 mm mass in the anterior body.  Endometrium:  Homogeneous,  11 mm in thickness.  Right ovary: 2.7 x 1.1 x 1.7 cm.  Left ovary: 4.6 x 4 x 4.4 cm.  Enlargement compared to the right secondary to a 3.7 cm anechoic cyst.  Pulsed Doppler evaluation demonstrates  venous waveforms present in both ovaries.  Normal low resistance arterial wave form present in the right ovary.  Probable, low resistance arterial wave form within the left ovary, although low amplitude.  The left ovary shows no morphologic features of torsion currently.  IMPRESSION:  1.  Vascular flow present in both ovaries. 2.  3.7 cm left ovarian cyst. 3.  Uterine fibroids, up to 2.5 cm.   Original Report Authenticated By: Jorje Guild   Dg Abd 2 Views  01/02/2013   *RADIOLOGY REPORT*  Clinical Data: Abdominal pain  ABDOMEN - 2 VIEW  Comparison: Prior CT from 06/26/2012  Findings: The visualized bowel gas pattern is nonobstructive. Moderate amount of retained stool seen within the left colon. There is no evidence of obstruction or ileus. No free intraperitoneal air.  No soft tissue masses or abnormal calcifications.  The osseous structures are within normal limits.  IMPRESSION: Nonobstructive bowel gas pattern.   Original Report Authenticated By: Jeannine Boga, M.D.       Assessment & Plan:   CPX/v70.0- Patient has been counseled on age-appropriate routine health concerns for screening and prevention. These are reviewed and up-to-date. Immunizations are up-to-date or declined. Labs ordered and  reviewed.  Acute (on chronic allergic) sinuitis - LGF and persisting purulent nasal DC with frontal L>R pressure >2 weeks - failing to improve with conservative care - Levaquin x 10d and nasal steroid daily - erx done Continue OTC decongestant and tylenol prn - call if worse of unimproved  R breast pain - exam deferred by given FH breast ca age <39 with reported "same early symptoms" will refer for dx mammo/US - Consider genetic testing - reports mom never underwent same - encouraged to discuss same with onc (follows there for hypercoag issues)  Problem List Items Addressed This Visit   ANEMIA-IRON DEFICIENCY      Follows with heme for same - last OV and labs reviewed Much improved since myomectomy performed and off anticoag Lab Results  Component Value Date   WBC 7.8 05/18/2013   HGB 13.9 05/18/2013   HCT 40.5 05/18/2013   MCV 83.3 05/18/2013   PLT 268 05/18/2013   Lab Results  Component Value Date   FERRITIN 207 05/18/2013        Other Visit Diagnoses   Routine general medical examination at a health care facility    -  Primary    Relevant Orders       Basic metabolic panel       CBC with Differential       Hepatic function panel       Lipid panel       TSH       Urinalysis, Routine w reflex microscopic    Sinusitis, chronic        Relevant Medications       levofloxacin (LEVAQUIN) tablet       NASACORT AQ 55 MCG/ACT NA AERS    Breast pain, right        Relevant Orders       MM Digital Diagnostic Bilat

## 2013-07-21 NOTE — Assessment & Plan Note (Signed)
Weight trends reviwed- encouraged continued work and success on same The patient is asked to make an attempt to improve diet and exercise patterns to aid in medical management of this problem.  Wt Readings from Last 3 Encounters:  07/21/13 248 lb 6.4 oz (112.674 kg)  05/18/13 244 lb 14.4 oz (111.086 kg)  01/18/13 246 lb 9.6 oz (111.857 kg)

## 2013-07-21 NOTE — Progress Notes (Signed)
Pre-visit discussion using our clinic review tool. No additional management support is needed unless otherwise documented below in the visit note.  

## 2013-07-21 NOTE — Assessment & Plan Note (Signed)
Follows with heme for same - last OV and labs reviewed Much improved since myomectomy performed and off anticoag Lab Results  Component Value Date   WBC 7.8 05/18/2013   HGB 13.9 05/18/2013   HCT 40.5 05/18/2013   MCV 83.3 05/18/2013   PLT 268 05/18/2013   Lab Results  Component Value Date   FERRITIN 207 05/18/2013

## 2013-07-23 ENCOUNTER — Other Ambulatory Visit (INDEPENDENT_AMBULATORY_CARE_PROVIDER_SITE_OTHER): Payer: BC Managed Care – PPO

## 2013-07-23 DIAGNOSIS — Z0000 Encounter for general adult medical examination without abnormal findings: Secondary | ICD-10-CM

## 2013-07-23 LAB — LIPID PANEL
CHOL/HDL RATIO: 3
CHOLESTEROL: 198 mg/dL (ref 0–200)
HDL: 58.8 mg/dL (ref >39.00)
LDL Cholesterol: 120 mg/dL — ABNORMAL HIGH (ref 0–99)
Triglycerides: 97 mg/dL (ref 0.0–149.0)
VLDL: 19.4 mg/dL (ref 0.0–40.0)

## 2013-07-23 LAB — CBC WITH DIFFERENTIAL/PLATELET
BASOS ABS: 0 10*3/uL (ref 0.0–0.1)
BASOS PCT: 0.5 % (ref 0.0–3.0)
Eosinophils Absolute: 0.1 10*3/uL (ref 0.0–0.7)
Eosinophils Relative: 2 % (ref 0.0–5.0)
HCT: 38.4 % (ref 36.0–46.0)
Hemoglobin: 12.8 g/dL (ref 12.0–15.0)
LYMPHS PCT: 27.1 % (ref 12.0–46.0)
Lymphs Abs: 1.8 10*3/uL (ref 0.7–4.0)
MCHC: 33.3 g/dL (ref 30.0–36.0)
MCV: 84.9 fl (ref 78.0–100.0)
MONOS PCT: 4 % (ref 3.0–12.0)
Monocytes Absolute: 0.3 10*3/uL (ref 0.1–1.0)
Neutro Abs: 4.5 10*3/uL (ref 1.4–7.7)
Neutrophils Relative %: 66.4 % (ref 43.0–77.0)
Platelets: 261 10*3/uL (ref 150.0–400.0)
RBC: 4.53 Mil/uL (ref 3.87–5.11)
RDW: 14.2 % (ref 11.5–14.6)
WBC: 6.8 10*3/uL (ref 4.5–10.5)

## 2013-07-23 LAB — URINALYSIS, ROUTINE W REFLEX MICROSCOPIC
Bilirubin Urine: NEGATIVE
HGB URINE DIPSTICK: NEGATIVE
Ketones, ur: NEGATIVE
LEUKOCYTES UA: NEGATIVE
Nitrite: NEGATIVE
Specific Gravity, Urine: >=1.03 — AB (ref 1.000–1.030)
TOTAL PROTEIN, URINE-UPE24: NEGATIVE
UROBILINOGEN UA: 0.2 (ref 0.0–1.0)
Urine Glucose: NEGATIVE
pH: 5.5 (ref 5.0–8.0)

## 2013-07-23 LAB — BASIC METABOLIC PANEL
BUN: 15 mg/dL (ref 6–23)
CALCIUM: 9.3 mg/dL (ref 8.4–10.5)
CO2: 25 mEq/L (ref 19–32)
Chloride: 103 mEq/L (ref 96–112)
Creatinine, Ser: 0.7 mg/dL (ref 0.4–1.2)
GFR: 100.19 mL/min (ref >60.00)
Glucose, Bld: 93 mg/dL (ref 70–99)
Potassium: 4.2 mEq/L (ref 3.5–5.1)
SODIUM: 135 meq/L (ref 135–145)

## 2013-07-23 LAB — HEPATIC FUNCTION PANEL
ALK PHOS: 85 U/L (ref 39–117)
ALT: 17 U/L (ref 0–35)
AST: 17 U/L (ref 0–37)
Albumin: 3.7 g/dL (ref 3.5–5.2)
Bilirubin, Direct: 0 mg/dL (ref 0.0–0.3)
TOTAL PROTEIN: 7.3 g/dL (ref 6.0–8.3)
Total Bilirubin: 0.3 mg/dL (ref 0.3–1.2)

## 2013-07-23 LAB — TSH: TSH: 1.43 u[IU]/mL (ref 0.35–5.50)

## 2013-08-06 ENCOUNTER — Ambulatory Visit
Admission: RE | Admit: 2013-08-06 | Discharge: 2013-08-06 | Disposition: A | Discharge status: Home / Self Care | Payer: BC Managed Care – PPO | Source: Ambulatory Visit | Attending: Internal Medicine | Admitting: Internal Medicine

## 2013-08-06 DIAGNOSIS — N644 Mastodynia: Secondary | ICD-10-CM

## 2013-08-16 ENCOUNTER — Other Ambulatory Visit (HOSPITAL_BASED_OUTPATIENT_CLINIC_OR_DEPARTMENT_OTHER): Payer: BC Managed Care – PPO

## 2013-08-16 DIAGNOSIS — D509 Iron deficiency anemia, unspecified: Secondary | ICD-10-CM

## 2013-08-16 DIAGNOSIS — D500 Iron deficiency anemia secondary to blood loss (chronic): Secondary | ICD-10-CM

## 2013-08-16 LAB — CBC WITH DIFFERENTIAL/PLATELET
BASO%: 0.7 % (ref 0.0–2.0)
Basophils Absolute: 0 10*3/uL (ref 0.0–0.1)
EOS%: 1.9 % (ref 0.0–7.0)
Eosinophils Absolute: 0.1 10*3/uL (ref 0.0–0.5)
HCT: 40.8 % (ref 34.8–46.6)
HGB: 13.6 g/dL (ref 11.6–15.9)
LYMPH%: 24.5 % (ref 14.0–49.7)
MCH: 27.8 pg (ref 25.1–34.0)
MCHC: 33.3 g/dL (ref 31.5–36.0)
MCV: 83.4 fL (ref 79.5–101.0)
MONO#: 0.4 10*3/uL (ref 0.1–0.9)
MONO%: 4.8 % (ref 0.0–14.0)
NEUT#: 5.1 10*3/uL (ref 1.5–6.5)
NEUT%: 68.1 % (ref 38.4–76.8)
PLATELETS: 239 10*3/uL (ref 145–400)
RBC: 4.89 10*6/uL (ref 3.70–5.45)
RDW: 14.3 % (ref 11.2–14.5)
WBC: 7.5 10*3/uL (ref 3.9–10.3)
lymph#: 1.8 10*3/uL (ref 0.9–3.3)

## 2013-08-16 LAB — IRON AND TIBC CHCC
%SAT: 23 % (ref 21–57)
Iron: 89 ug/dL (ref 41–142)
TIBC: 388 ug/dL (ref 236–444)
UIBC: 298 ug/dL (ref 120–384)

## 2013-08-16 LAB — FERRITIN CHCC: Ferritin: 188 ng/ml (ref 9–269)

## 2013-11-15 ENCOUNTER — Encounter: Payer: Self-pay | Admitting: Internal Medicine

## 2013-11-15 ENCOUNTER — Ambulatory Visit (HOSPITAL_BASED_OUTPATIENT_CLINIC_OR_DEPARTMENT_OTHER): Payer: BC Managed Care – PPO | Admitting: Internal Medicine

## 2013-11-15 ENCOUNTER — Other Ambulatory Visit (HOSPITAL_BASED_OUTPATIENT_CLINIC_OR_DEPARTMENT_OTHER): Payer: BC Managed Care – PPO

## 2013-11-15 ENCOUNTER — Telehealth: Payer: Self-pay | Admitting: Internal Medicine

## 2013-11-15 VITALS — BP 152/89 | HR 83 | Temp 98.2°F | Resp 18 | Ht <= 58 in | Wt 253.0 lb

## 2013-11-15 DIAGNOSIS — D500 Iron deficiency anemia secondary to blood loss (chronic): Secondary | ICD-10-CM

## 2013-11-15 DIAGNOSIS — D509 Iron deficiency anemia, unspecified: Secondary | ICD-10-CM

## 2013-11-15 DIAGNOSIS — Z86711 Personal history of pulmonary embolism: Secondary | ICD-10-CM

## 2013-11-15 DIAGNOSIS — I2699 Other pulmonary embolism without acute cor pulmonale: Secondary | ICD-10-CM

## 2013-11-15 DIAGNOSIS — N920 Excessive and frequent menstruation with regular cycle: Secondary | ICD-10-CM

## 2013-11-15 LAB — CBC WITH DIFFERENTIAL/PLATELET
BASO%: 0.3 % (ref 0.0–2.0)
BASOS ABS: 0 10*3/uL (ref 0.0–0.1)
EOS ABS: 0.2 10*3/uL (ref 0.0–0.5)
EOS%: 2.6 % (ref 0.0–7.0)
HEMATOCRIT: 37.8 % (ref 34.8–46.6)
HEMOGLOBIN: 12.7 g/dL (ref 11.6–15.9)
LYMPH#: 2.2 10*3/uL (ref 0.9–3.3)
LYMPH%: 31.2 % (ref 14.0–49.7)
MCH: 27.9 pg (ref 25.1–34.0)
MCHC: 33.6 g/dL (ref 31.5–36.0)
MCV: 82.9 fL (ref 79.5–101.0)
MONO#: 0.3 10*3/uL (ref 0.1–0.9)
MONO%: 4.7 % (ref 0.0–14.0)
NEUT%: 61.2 % (ref 38.4–76.8)
NEUTROS ABS: 4.4 10*3/uL (ref 1.5–6.5)
Platelets: 251 10*3/uL (ref 145–400)
RBC: 4.56 10*6/uL (ref 3.70–5.45)
RDW: 13.8 % (ref 11.2–14.5)
WBC: 7.2 10*3/uL (ref 3.9–10.3)

## 2013-11-15 NOTE — Telephone Encounter (Signed)
gv and printed appt sched and avs for pt for Sept and Dec

## 2013-11-15 NOTE — Progress Notes (Signed)
[place] OFFICE PROGRESS NOTE  Gwendolyn Grant, MD [name] [address] [place] [id number]  DIAGNOSIS: ANEMIA-IRON DEFICIENCY - Plan: CBC with Differential, Iron and TIBC [facility], Ferritin, CBC with Differential, Basic metabolic panel (Bmet) - [facility], Ferritin, Iron and TIBC [facility]  PULMONARY EMBOLISM  Chief Complaint  Patient presents with  . ANEMIA-IRON DEFICIENCY    CURRENT THERAPY: FeSO4 bid.  Feraheme prn.   INTERVAL HISTORY: [name] 38 y.o. female with a history of iron-deficiency anemia secondary to uterine bleeding is here for follow-up.  She reports her LMP was on 5/15 with heavy flow for about 7 days.  Her menses is usually every 14-21 days.  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  She was last seen by me on 05/18/2013.  At times, she reports decrease energy and fatigue.  She works part-time as a Geophysicist/field seismologist.  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  Duplex of lower extremity were negative.  PE was in August 2010.  She continues to take ferrous sulfate 325 mg twice daily.  She reports heavy periods as noted above.  She is being followed by [facility] with consideration for a trial of mirena but she is hesitant to have this due to concerns on her desire to have children in the near future.   MEDICAL HISTORY: Past Medical History  Diagnosis Date  . OBESITY   . 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 POLYCYSTIC OVARIAN DISEASE   . OBSTRUCTIVE SLEEP APNEA   . ANEMIA-IRON DEFICIENCY   . 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 Prothrombin mutation     heterozygous prothrombin II mutation  . Allergy     seasonal allergies  . 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    ALLERGIES:  is allergic to oxycodone; gluten; betadine; and chlorhexidine.  MEDICATIONS: has a current medication list which includes the following prescription(s): activated charcoal, epinephrine, ferrous sulfate, multivitamin, triamcinolone, and vitamin c.  SURGICAL HISTORY:  Past Surgical History  Procedure Laterality Date  . Myomectomy abdominal approach  0174    No complications  . Myomectomy  02/12/2011    Procedure: MYOMECTOMY;  Surgeon: Luz Lex, MD;  Location: [facility];  Service: Gynecology;  Laterality: N/A;  . Colostomy  03/2011    Dr. Brantley Stage  . Colostomy reversal     PROBLEM LIST:  1. Bilateral pulmonary emboli on 01/31/2009. The patient was on a NuvaRing at that time. Doppler studies of the legs were negative for DVT.  2. 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  3. Polycystic ovary disease.  4. Obesity.  5. Sleep apnea.  6. Uterine fibroids and menorrhagia.  7. 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 Resolved.  8. Diverting loop sigmoid colostomy created on 03/27/2011 with reversal carried out during the late spring or early summer of 2013.  9. Anticoagulation therapy was discontinued in August 2013 at the recommendation of Dr. Odis Hollingshead from [facility].  10. Iron deficiency anemia secondary to excessive menstrual bleeding. A von Willebrand's panel carried out on 11/21/2009 was negative. A celiac panel carried out on 03/06/2012 was negative. Patient received IV Feraheme 510 mg on 11/20/2011 and 11/27/2011.  REVIEW OF SYSTEMS:   Constitutional: Denies fevers,  chills or abnormal weight loss Eyes: Denies blurriness of vision Ears, nose, mouth, throat, and face: Denies mucositis or sore throat Respiratory: Denies cough, dyspnea or wheezes Cardiovascular: Denies palpitation, chest discomfort or lower extremity swelling Gastrointestinal:  Denies nausea, heartburn or change in bowel habits Skin: Denies abnormal skin rashes Lymphatics: Denies new lymphadenopathy or easy bruising Neurological:Denies numbness, tingling or new weaknesses Behavioral/Psych: Mood is stable, no new changes  All other systems were reviewed with the patient and are negative.  PHYSICAL EXAMINATION: ECOG PERFORMANCE STATUS: 0 - Asymptomatic  Blood pressure 152/89, pulse 83, temperature 98.2 F (36.8 C), temperature source Oral, resp. rate 18, height 4\' 10"  (1.473 m), weight 253 lb (114.76 kg).  GENERAL:alert, no distress and comfortable; morbidly obese  SKIN: skin color, texture, turgor are normal, no rashes or significant lesions EYES: normal, Conjunctiva are pink and non-injected, sclera clear OROPHARYNX:no exudate, no erythema and lips, buccal mucosa, and tongue normal  NECK: supple, thyroid normal size, non-tender, without nodularity LYMPH:  no palpable lymphadenopathy in the cervical, axillary or supraclavicular LUNGS: clear to auscultation and percussion with normal breathing effort HEART: regular rate & rhythm and no murmurs and no lower extremity edema ABDOMEN:abdomen soft, non-tender and normal bowel sounds; obese. Surgical scars well healed.  Musculoskeletal:no cyanosis of digits and no clubbing  NEURO: alert & oriented x 3 with fluent speech, no focal motor/sensory deficits  LABORATORY DATA: Results for orders placed in visit on 11/15/13 (from the past 48 hour(s))  CBC WITH DIFFERENTIAL     Status: None   Collection Time    11/15/13  2:58 PM      Result Value Ref Range   WBC 7.2  3.9 - 10.3 10e3/uL   NEUT# 4.4  1.5 - 6.5 10e3/uL   HGB 12.7  11.6 - 15.9 g/dL    HCT 37.8  34.8 - 46.6 %   Platelets 251  145 - 400 10e3/uL   MCV 82.9  79.5 - 101.0 fL   MCH 27.9  25.1 - 34.0 pg   MCHC 33.6  31.5 - 36.0 g/dL   RBC 4.56  3.70 - 5.45 10e6/uL   RDW 13.8  11.2 - 14.5 %   lymph# 2.2  0.9 - 3.3 10e3/uL   MONO# 0.3  0.1 - 0.9 10e3/uL   Eosinophils Absolute 0.2  0.0 - 0.5 10e3/uL   Basophils Absolute 0.0  0.0 - 0.1 10e3/uL   NEUT% 61.2  38.4 - 76.8 %   LYMPH% 31.2  14.0 - 49.7 %   MONO% 4.7  0.0 - 14.0 %   EOS% 2.6  0.0 - 7.0 %   BASO% 0.3  0.0 - 2.0 %     Labs:  Lab Results  Component Value Date   WBC 7.2 11/15/2013   HGB 12.7 11/15/2013   HCT 37.8 11/15/2013   MCV 82.9 11/15/2013   PLT 251 11/15/2013   NEUTROABS 4.4 11/15/2013      Chemistry      Component Value Date/Time   NA 135 07/23/2013 1143   NA 137 05/18/2013 1434   K 4.2 07/23/2013 1143  K 4.1 05/18/2013 1434   CL 103 07/23/2013 1143   CL 104 11/16/2012 1348   CO2 25 07/23/2013 1143   CO2 24 05/18/2013 1434   BUN 15 07/23/2013 1143   BUN 12.8 05/18/2013 1434   CREATININE 0.7 07/23/2013 1143   CREATININE 0.7 05/18/2013 1434      Component Value Date/Time   CALCIUM 9.3 07/23/2013 1143   CALCIUM 9.4 05/18/2013 1434   ALKPHOS 85 07/23/2013 1143   ALKPHOS 112 05/18/2013 1434   AST 17 07/23/2013 1143   AST 17 05/18/2013 1434   ALT 17 07/23/2013 1143   ALT 16 05/18/2013 1434   BILITOT 0.3 07/23/2013 1143   BILITOT 0.49 05/18/2013 1434     Basic Metabolic Panel: No results found for this basename: NA, K, CL, CO2, GLUCOSE, BUN, CREATININE, CALCIUM, MG, PHOS,  in the last 168 hours GFR Estimated Creatinine Clearance: 104 ml/min (by C-G formula based on Cr of 0.7). Liver Function Tests: No results found for this basename: AST, ALT, ALKPHOS, BILITOT, PROT, ALBUMIN,  in the last 168 hours CBC:  Recent Labs Lab 11/15/13 1458  WBC 7.2  NEUTROABS 4.4  HGB 12.7  HCT 37.8  MCV 82.9  PLT 251   Anemia work up Results for JARROD, BODKINS (MRN [id number]) as of 11/15/2013 16:24  Ref.  Range 08/16/2013 14:03  Iron Latest Range: 42-145 ug/dL 89  UIBC Latest Range: 125-400 ug/dL 298  TIBC Latest Range: 250-470 ug/dL 388  %SAT Latest Range: 20-55 % 23  Ferritin Latest Range: 10-291 ng/mL 188    IMAGING STUDIES:  1. CT scan of the abdomen and pelvis with IV contrast carried out on 02/20/2011 showed abscess collection identified superior to the uterus extending along the left lateral aspect, 10.2 x 4.2 x 5 cm  in size, appearing to extend into the left lateral aspect of the uterus and quest onably site of myomectomy. There was additional gas collection seen in the anterior abdominal wall fascial planes.  There was rounded focal fluid collection with enhancing wall in the region of the left adnexa, possibly an ovarian cyst. There was an open ventral wound anterior abdominal wall of pelvis.  2. CT scan of the abdomen and pelvis with IV contrast on 03/12/2011 showed persistent fistulous tract from the sigmoid colon to the anterior abdominal wall, slight interval decrease in the size of the abscess just superior to the uterus, and stable surgical changes involving the uterus.  3. CT scan of the abdomen and pelvis without IV contrast on 05/09/2011 showed diverting loop colostomy in the left lower quadrant with no complicating features. There was a tiny residual colocutaneous fistula. There was minimal residual subcutaneous and anterior abdominal wall inflammation, but no abscess.  4. CT scan of the abdomen and pelvis with IV contrast on 06/27/2011 showed left lower quadrant loop colostomy with no extravasation of contrast, although the proximal sigmoid is suboptimally opacified.  The appearance suggested the closure of the fistula that was seen on the study of 05/09/2011.   PROCEDURES:  1. Flexible sigmoidoscopy with submucosal injections on 09/10/2011. At 35 cm from the anus, there was a site of injured colon, hypertrophied mucosa. Abutting this site directly, the lumen of the colon was  quite narrowed and the scope could not be passed through it. This area was labeled with Niger ink injection.  2. EGD with duodenal biopsies to check for celiac sprue were carried out. The EGD was normal.  ASSESSMENT: [name] 38 y.o. female with a history  of ANEMIA-IRON DEFICIENCY - Plan: CBC with Differential, Iron and TIBC [facility], Ferritin, CBC with Differential, Basic metabolic panel (Bmet) - [facility], Ferritin, Iron and TIBC [facility]  PULMONARY EMBOLISM   PLAN:   1. IDA secondary menorrhagia and metrorrhagia.  --Patient is doing well today.  Her iron indices appear to be within normal limits on her visit 08/16/2013.  -- Patient aware to report for increasing symptoms of anemia such as doe, palpitations.  She was provided a handout on anemia.  --Continue FeSO4 bid.   Follow-up with Gyn as needed.    2. History of Hetrozygous Prothrombin II gene mutation complicated by PE (bilateral) while on OCP.  --Seen by Dr. Joan Flores at [facility] who recommended against further a/c.  Patient counseled to take a/c for related to increased risk procedures, prolonged immobilization or hormonal  therapy which may increase her risks of clotting.   3. Morbid obesity. --She is discussing nutrition, diet and exercise with her PCP.   4. Follow-up. --We will check CBC and iron studies in 3 months and plan to see her again in 6 months, at which time we will check CBC, chemistries and iron studies.    All questions were answered. The patient knows to call the clinic with any problems, questions or concerns. We can certainly see the patient much sooner if necessary.  I spent 10 minutes counseling the patient face to face. The total time spent in the appointment was 15 minutes.    Siriah Treat, MD 11/15/2013 4:24 PM

## 2013-11-16 LAB — FERRITIN CHCC: Ferritin: 158 ng/ml (ref 9–269)

## 2013-11-16 LAB — IRON AND TIBC CHCC
%SAT: 15 % — ABNORMAL LOW (ref 21–57)
Iron: 54 ug/dL (ref 41–142)
TIBC: 370 ug/dL (ref 236–444)
UIBC: 316 ug/dL (ref 120–384)

## 2014-02-08 ENCOUNTER — Other Ambulatory Visit: Payer: Self-pay

## 2014-02-08 ENCOUNTER — Telehealth: Payer: Self-pay

## 2014-02-08 MED ORDER — EPINEPHRINE 0.3 MG/0.3ML IJ SOAJ: 0.3000 mg | Freq: Once | INTRAMUSCULAR | Status: AC

## 2014-02-08 NOTE — Telephone Encounter (Signed)
Faxed pt medical records to [facility]

## 2014-02-14 ENCOUNTER — Other Ambulatory Visit (HOSPITAL_BASED_OUTPATIENT_CLINIC_OR_DEPARTMENT_OTHER): Payer: BC Managed Care – PPO

## 2014-02-14 DIAGNOSIS — D509 Iron deficiency anemia, unspecified: Secondary | ICD-10-CM

## 2014-02-14 DIAGNOSIS — D500 Iron deficiency anemia secondary to blood loss (chronic): Secondary | ICD-10-CM

## 2014-02-14 LAB — CBC WITH DIFFERENTIAL/PLATELET
BASO%: 0.5 % (ref 0.0–2.0)
BASOS ABS: 0 10*3/uL (ref 0.0–0.1)
EOS%: 2.9 % (ref 0.0–7.0)
Eosinophils Absolute: 0.2 10*3/uL (ref 0.0–0.5)
HCT: 40.6 % (ref 34.8–46.6)
HEMOGLOBIN: 13.2 g/dL (ref 11.6–15.9)
LYMPH#: 2.5 10*3/uL (ref 0.9–3.3)
LYMPH%: 32.7 % (ref 14.0–49.7)
MCH: 27.3 pg (ref 25.1–34.0)
MCHC: 32.5 g/dL (ref 31.5–36.0)
MCV: 83.8 fL (ref 79.5–101.0)
MONO#: 0.3 10*3/uL (ref 0.1–0.9)
MONO%: 4.4 % (ref 0.0–14.0)
NEUT#: 4.5 10*3/uL (ref 1.5–6.5)
NEUT%: 59.5 % (ref 38.4–76.8)
Platelets: 273 10*3/uL (ref 145–400)
RBC: 4.85 10*6/uL (ref 3.70–5.45)
RDW: 14.1 % (ref 11.2–14.5)
WBC: 7.5 10*3/uL (ref 3.9–10.3)

## 2014-02-15 LAB — FERRITIN CHCC: Ferritin: 145 ng/ml (ref 9–269)

## 2014-02-15 LAB — IRON AND TIBC CHCC
%SAT: 10 % — ABNORMAL LOW (ref 21–57)
IRON: 40 ug/dL — AB (ref 41–142)
TIBC: 398 ug/dL (ref 236–444)
UIBC: 358 ug/dL (ref 120–384)

## 2014-05-06 ENCOUNTER — Telehealth: Payer: Self-pay | Admitting: Hematology

## 2014-05-06 NOTE — Telephone Encounter (Signed)
lvm for pt regarding to Dec appt....mailed pt appt sched/avs and letter °

## 2014-05-09 ENCOUNTER — Other Ambulatory Visit: Payer: BC Managed Care – PPO

## 2014-05-09 ENCOUNTER — Ambulatory Visit: Payer: BC Managed Care – PPO

## 2014-05-31 ENCOUNTER — Other Ambulatory Visit: Payer: Self-pay | Admitting: *Deleted

## 2014-05-31 DIAGNOSIS — D509 Iron deficiency anemia, unspecified: Secondary | ICD-10-CM

## 2014-06-01 ENCOUNTER — Ambulatory Visit (HOSPITAL_BASED_OUTPATIENT_CLINIC_OR_DEPARTMENT_OTHER): Payer: BC Managed Care – PPO | Admitting: Hematology

## 2014-06-01 ENCOUNTER — Telehealth: Payer: Self-pay | Admitting: Hematology

## 2014-06-01 ENCOUNTER — Other Ambulatory Visit (HOSPITAL_BASED_OUTPATIENT_CLINIC_OR_DEPARTMENT_OTHER): Payer: BC Managed Care – PPO

## 2014-06-01 VITALS — BP 115/67 | HR 70 | Temp 98.8°F | Resp 19 | Ht <= 58 in | Wt 236.1 lb

## 2014-06-01 DIAGNOSIS — Z86711 Personal history of pulmonary embolism: Secondary | ICD-10-CM

## 2014-06-01 DIAGNOSIS — D500 Iron deficiency anemia secondary to blood loss (chronic): Secondary | ICD-10-CM

## 2014-06-01 DIAGNOSIS — D509 Iron deficiency anemia, unspecified: Secondary | ICD-10-CM

## 2014-06-01 DIAGNOSIS — N921 Excessive and frequent menstruation with irregular cycle: Secondary | ICD-10-CM

## 2014-06-01 DIAGNOSIS — N920 Excessive and frequent menstruation with regular cycle: Secondary | ICD-10-CM

## 2014-06-01 DIAGNOSIS — D6852 Prothrombin gene mutation: Secondary | ICD-10-CM

## 2014-06-01 LAB — COMPREHENSIVE METABOLIC PANEL (CC13)
ALBUMIN: 3.7 g/dL (ref 3.5–5.0)
ALK PHOS: 80 U/L (ref 40–150)
ALT: 19 U/L (ref 0–55)
AST: 16 U/L (ref 5–34)
Anion Gap: 9 mEq/L (ref 3–11)
BUN: 14.6 mg/dL (ref 7.0–26.0)
CALCIUM: 9.2 mg/dL (ref 8.4–10.4)
CHLORIDE: 103 meq/L (ref 98–109)
CO2: 24 mEq/L (ref 22–29)
Creatinine: 0.8 mg/dL (ref 0.6–1.1)
EGFR: >90 mL/min/{1.73_m2} (ref >90)
Glucose: 87 mg/dl (ref 70–140)
POTASSIUM: 4 meq/L (ref 3.5–5.1)
Sodium: 137 mEq/L (ref 136–145)
Total Bilirubin: 0.37 mg/dL (ref 0.20–1.20)
Total Protein: 7 g/dL (ref 6.4–8.3)

## 2014-06-01 LAB — CBC WITH DIFFERENTIAL/PLATELET
BASO%: 0.5 % (ref 0.0–2.0)
Basophils Absolute: 0 10*3/uL (ref 0.0–0.1)
EOS%: 2.6 % (ref 0.0–7.0)
Eosinophils Absolute: 0.2 10*3/uL (ref 0.0–0.5)
HEMATOCRIT: 41.3 % (ref 34.8–46.6)
HEMOGLOBIN: 13.5 g/dL (ref 11.6–15.9)
LYMPH%: 28.7 % (ref 14.0–49.7)
MCH: 27.4 pg (ref 25.1–34.0)
MCHC: 32.7 g/dL (ref 31.5–36.0)
MCV: 83.8 fL (ref 79.5–101.0)
MONO#: 0.3 10*3/uL (ref 0.1–0.9)
MONO%: 5.1 % (ref 0.0–14.0)
NEUT#: 3.8 10*3/uL (ref 1.5–6.5)
NEUT%: 63.1 % (ref 38.4–76.8)
NRBC: 0 % (ref 0–0)
Platelets: 224 10*3/uL (ref 145–400)
RBC: 4.93 10*6/uL (ref 3.70–5.45)
RDW: 13.8 % (ref 11.2–14.5)
WBC: 6.1 10*3/uL (ref 3.9–10.3)
lymph#: 1.7 10*3/uL (ref 0.9–3.3)

## 2014-06-01 LAB — IRON AND TIBC CHCC
%SAT: 10 % — ABNORMAL LOW (ref 21–57)
IRON: 37 ug/dL — AB (ref 41–142)
TIBC: 385 ug/dL (ref 236–444)
UIBC: 348 ug/dL (ref 120–384)

## 2014-06-01 LAB — FERRITIN CHCC: Ferritin: 114 ng/ml (ref 9–269)

## 2014-06-01 NOTE — Telephone Encounter (Signed)
Pt confirmed labs/ov per 12/30 chart, mailed pt updated sch.... Cherylann Banas

## 2014-06-01 NOTE — Progress Notes (Signed)
[place] OFFICE PROGRESS NOTE  [address], [place] [address]  DIAGNOSIS: Iron deficiency anemia   PROBLEM LIST:  1. Bilateral pulmonary emboli on 01/31/2009. The patient was on a NuvaRing at that time. Doppler studies of the legs were negative for DVT.  2. 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  3. Polycystic ovary disease.  4. Obesity.  5. Sleep apnea.  6. Uterine fibroids and menorrhagia.  7. 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 Resolved.  8. Diverting loop sigmoid colostomy created on 03/27/2011 with reversal carried out during the late spring or early summer of 2013.  9. Anticoagulation therapy was discontinued in August 2013 at the recommendation of Dr. Odis Hollingshead from [facility].  10. Iron deficiency anemia secondary to excessive menstrual bleeding. A von Willebrand's panel carried out on 11/21/2009 was negative. A celiac panel carried out on 03/06/2012 was negative. Patient received IV Feraheme 510 mg on 11/20/2011 and 11/27/2011.   Chief Complaint  Patient presents with  . Follow-up    anemia    CURRENT THERAPY: FeSO4 bid.  Feraheme prn.   INTERVAL HISTORY: [name] 38 y.o. female with a history of iron-deficiency anemia secondary to uterine bleeding is here for follow-up.  She reports her LMP was on 5/15 with heavy flow for about 7 days.  Her menses is usually every 14-21 days.  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  She was last seen by me on 05/18/2013.  At times, she reports decrease energy and fatigue.  She works part-time as a Geophysicist/field seismologist.  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  Duplex of lower extremity were negative.  PE was in August 2010.  She continues to  take ferrous sulfate 325 mg twice daily.  She reports heavy periods as noted above.  She is being followed by [facility] with consideration for a trial of mirena but she is hesitant to have this due to concerns on her desire to have children in the near future.   She still has heavy period, changes every 1 hr in the first 3 days. She is taking iron pill two daily. She feels well overall. No other new complaints.  MEDICAL HISTORY: Past Medical History  Diagnosis Date  . OBESITY   . 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 POLYCYSTIC OVARIAN DISEASE   . OBSTRUCTIVE SLEEP APNEA   . ANEMIA-IRON DEFICIENCY   . 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 Prothrombin mutation     heterozygous prothrombin II mutation  . Allergy     seasonal allergies  . 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    ALLERGIES:  is allergic to oxycodone; gluten; betadine; and chlorhexidine.  MEDICATIONS: has a current medication list which includes the following prescription(s): activated charcoal, vitamin d3, ferrous sulfate, magnesium, multivitamin, triamcinolone, triamcinolone, UNABLE  TO FIND, UNABLE TO FIND, vitamin c, and epinephrine.  SURGICAL HISTORY:  Past Surgical History  Procedure Laterality Date  . Myomectomy abdominal approach  0932    No complications  . Myomectomy  02/12/2011    Procedure: MYOMECTOMY;  Surgeon: Luz Lex, MD;  Location: [facility];  Service: Gynecology;  Laterality: N/A;  . Colostomy  03/2011    Dr. Brantley Stage  . Colostomy reversal       REVIEW OF SYSTEMS:   Constitutional: Denies fevers, chills or abnormal weight loss Eyes: Denies blurriness of vision Ears, nose, mouth, throat, and face: Denies mucositis or sore throat Respiratory: Denies cough, dyspnea or wheezes Cardiovascular: Denies palpitation, chest discomfort or lower extremity swelling Gastrointestinal:  Denies nausea, heartburn or change in bowel habits Skin: Denies abnormal skin rashes Lymphatics: Denies new lymphadenopathy or easy bruising Neurological:Denies numbness, tingling or new weaknesses Behavioral/Psych: Mood is stable, no new changes  All other systems were reviewed with the patient and are negative.  PHYSICAL EXAMINATION: ECOG PERFORMANCE STATUS: 0 - Asymptomatic  Blood pressure 115/67, pulse 70, temperature 98.8 F (37.1 C), temperature source Oral, resp. rate 19, height 4\' 10"  (1.473 m), weight 236 lb 1.6 oz (107.094 kg), SpO2 100 %.  GENERAL:alert, no distress and comfortable; morbidly obese  SKIN: skin color, texture, turgor are normal, no rashes or significant lesions EYES: normal, Conjunctiva are pink and non-injected, sclera clear OROPHARYNX:no exudate, no erythema and lips, buccal mucosa, and tongue normal  NECK: supple, thyroid normal size, non-tender, without nodularity LYMPH:  no palpable lymphadenopathy in the cervical, axillary or supraclavicular LUNGS: clear to auscultation and percussion with normal breathing effort HEART: regular rate & rhythm and no murmurs and no lower extremity edema ABDOMEN:abdomen soft, non-tender and normal bowel sounds; obese. Surgical scars well healed.  Musculoskeletal:no cyanosis of digits and no clubbing  NEURO: alert & oriented x 3 with fluent speech, no focal motor/sensory deficits  LABORATORY DATA: CBC Latest Ref Rng 06/01/2014 02/14/2014 11/15/2013  WBC 3.9 - 10.3 10e3/uL 6.1 7.5 7.2  Hemoglobin 11.6 - 15.9 g/dL 13.5 13.2 12.7  Hematocrit 34.8 - 46.6 % 41.3 40.6 37.8  Platelets 145 - 400  10e3/uL 224 273 251    CMP Latest Ref Rng 06/01/2014 07/23/2013 05/18/2013  Glucose 70 - 140 mg/dl 87 93 98  BUN 7.0 - 26.0 mg/dL 14.6 15 12.8  Creatinine 0.6 - 1.1 mg/dL 0.8 0.7 0.7  Sodium 136 - 145 mEq/L 137 135 137  Potassium 3.5 - 5.1 mEq/L 4.0 4.2 4.1  Chloride 96 - 112 mEq/L - 103 -  CO2 22 - 29 mEq/L 24 25 24   Calcium 8.4 - 10.4 mg/dL 9.2 9.3 9.4  Total Protein 6.4 - 8.3 g/dL 7.0 7.3 7.8  Total Bilirubin 0.20 - 1.20 mg/dL 0.37 0.3 0.49  Alkaline Phos 40 - 150 U/L 80 85 112  AST 5 - 34 U/L 16 17 17   ALT 0 - 55 U/L 19 17 16     Ferritin (Order [id number])      Ferritin  Status: Finalresult Visible to patient:  Not Released Nextappt: 07/25/2014 at 03:00 PM in Internal Medicine Gwendolyn Grant, MD) Dx:  Iron deficiency anemia           Ref Range 2d ago  31mo ago  47mo ago     Ferritin 9 - 269 ng/ml 114 145 158      Iron and TIBC [facility]  Status: Finalresult Visible to patient:  Not Released Nextappt: 07/25/2014 at 03:00 PM in Internal Medicine (  Gwendolyn Grant, MD) Dx:  Iron deficiency anemia              Ref Range 2d ago  33mo ago  61mo ago     Iron 41 - 142 ug/dL 37 (L) 40 (L) 54    TIBC 236 - 444 ug/dL 385 398 370    UIBC 120 - 384 ug/dL 348 358 316    %SAT 21 - 57 % 10 (L) 10 (L) 15 (L)        IMAGING STUDIES:  1. CT scan of the abdomen and pelvis with IV contrast carried out on 02/20/2011 showed abscess collection identified superior to the uterus extending along the left lateral aspect, 10.2 x 4.2 x 5 cm  in size, appearing to extend into the left lateral aspect of the uterus and quest onably site of myomectomy. There was additional gas collection seen in the anterior abdominal wall fascial planes.  There was rounded focal fluid collection with enhancing wall in the region of the left adnexa, possibly an ovarian cyst. There was an open ventral wound anterior abdominal wall of pelvis.  2. CT scan of the abdomen and  pelvis with IV contrast on 03/12/2011 showed persistent fistulous tract from the sigmoid colon to the anterior abdominal wall, slight interval decrease in the size of the abscess just superior to the uterus, and stable surgical changes involving the uterus.  3. CT scan of the abdomen and pelvis without IV contrast on 05/09/2011 showed diverting loop colostomy in the left lower quadrant with no complicating features. There was a tiny residual colocutaneous fistula. There was minimal residual subcutaneous and anterior abdominal wall inflammation, but no abscess.  4. CT scan of the abdomen and pelvis with IV contrast on 06/27/2011 showed left lower quadrant loop colostomy with no extravasation of contrast, although the proximal sigmoid is suboptimally opacified.  The appearance suggested the closure of the fistula that was seen on the study of 05/09/2011.   PROCEDURES:  1. Flexible sigmoidoscopy with submucosal injections on 09/10/2011. At 35 cm from the anus, there was a site of injured colon, hypertrophied mucosa. Abutting this site directly, the lumen of the colon was quite narrowed and the scope could not be passed through it. This area was labeled with Niger ink injection.  2. EGD with duodenal biopsies to check for celiac sprue were carried out. The EGD was normal.  ASSESSMENT: [name] 38 y.o. female with a history of Iron deficiency anemia   PLAN:   1. IDA secondary menorrhagia and metrorrhagia.  --Patient is doing well today.  Her hemoglobin was normal today. Ferritin is also adequate.  -- Patient aware to report for increasing symptoms of anemia such as doe, palpitations.  She was provided a handout on anemia.  --Continue FeSO4 bid.   Follow-up with Gyn as needed.    2. History of Hetrozygous Prothrombin II gene mutation complicated by PE (bilateral) while on OCP.  --Seen by Dr. Joan Flores at [facility] who recommended against further a/c.  Patient counseled to take a/c for related to increased  risk procedures, prolonged immobilization or hormonal  therapy which may increase her risks of clotting.   3. Morbid obesity. --She is discussing nutrition, diet and exercise with her PCP.   All questions were answered. The patient knows to call the clinic with any problems, questions or concerns. We can certainly see the patient much sooner if necessary.  Paln -lab and follow up in 6 month -I will copy your lab results  to your PCP   I spent 10 minutes counseling the patient face to face. The total time spent in the appointment was 15 minutes.    Truitt Merle, MD 06/03/2014 11:19 AM

## 2014-06-03 ENCOUNTER — Encounter: Payer: Self-pay | Admitting: Hematology

## 2014-07-25 ENCOUNTER — Encounter: Payer: BC Managed Care – PPO | Admitting: Internal Medicine

## 2014-12-01 ENCOUNTER — Encounter: Payer: Self-pay | Admitting: Hematology

## 2014-12-01 ENCOUNTER — Telehealth: Payer: Self-pay | Admitting: Hematology

## 2014-12-01 ENCOUNTER — Other Ambulatory Visit (HOSPITAL_BASED_OUTPATIENT_CLINIC_OR_DEPARTMENT_OTHER): Payer: BLUE CROSS/BLUE SHIELD

## 2014-12-01 ENCOUNTER — Ambulatory Visit (HOSPITAL_BASED_OUTPATIENT_CLINIC_OR_DEPARTMENT_OTHER): Payer: BLUE CROSS/BLUE SHIELD | Admitting: Hematology

## 2014-12-01 VITALS — BP 125/84 | HR 78 | Temp 98.5°F | Resp 18 | Ht <= 58 in | Wt 220.2 lb

## 2014-12-01 DIAGNOSIS — D6852 Prothrombin gene mutation: Secondary | ICD-10-CM | POA: Diagnosis not present

## 2014-12-01 DIAGNOSIS — N921 Excessive and frequent menstruation with irregular cycle: Secondary | ICD-10-CM

## 2014-12-01 DIAGNOSIS — D509 Iron deficiency anemia, unspecified: Secondary | ICD-10-CM

## 2014-12-01 LAB — CBC & DIFF AND RETIC
BASO%: 0.3 % (ref 0.0–2.0)
Basophils Absolute: 0 10*3/uL (ref 0.0–0.1)
EOS ABS: 0.1 10*3/uL (ref 0.0–0.5)
EOS%: 1.7 % (ref 0.0–7.0)
HCT: 41 % (ref 34.8–46.6)
HGB: 14.1 g/dL (ref 11.6–15.9)
Immature Retic Fract: 4.7 % (ref 1.60–10.00)
LYMPH%: 31.8 % (ref 14.0–49.7)
MCH: 28.2 pg (ref 25.1–34.0)
MCHC: 34.4 g/dL (ref 31.5–36.0)
MCV: 82 fL (ref 79.5–101.0)
MONO#: 0.4 10*3/uL (ref 0.1–0.9)
MONO%: 5.5 % (ref 0.0–14.0)
NEUT%: 60.7 % (ref 38.4–76.8)
NEUTROS ABS: 4 10*3/uL (ref 1.5–6.5)
PLATELETS: 238 10*3/uL (ref 145–400)
RBC: 5 10*6/uL (ref 3.70–5.45)
RDW: 13.5 % (ref 11.2–14.5)
RETIC CT ABS: 67.5 10*3/uL (ref 33.70–90.70)
Retic %: 1.35 % (ref 0.70–2.10)
WBC: 6.6 10*3/uL (ref 3.9–10.3)
lymph#: 2.1 10*3/uL (ref 0.9–3.3)
nRBC: 0 % (ref 0–0)

## 2014-12-01 LAB — IRON AND TIBC CHCC
%SAT: 13 % — ABNORMAL LOW (ref 21–57)
IRON: 45 ug/dL (ref 41–142)
TIBC: 344 ug/dL (ref 236–444)
UIBC: 298 ug/dL (ref 120–384)

## 2014-12-01 LAB — FERRITIN CHCC: Ferritin: 123 ng/ml (ref 9–269)

## 2014-12-01 MED ORDER — FERROUS SULFATE 325 (65 FE) MG PO TABS: 325.0000 mg | ORAL_TABLET | Freq: Two times a day (BID) | ORAL | Status: AC

## 2014-12-01 NOTE — Progress Notes (Addendum)
[name] OFFICE PROGRESS NOTE  Helane Rima, MD [place] [id number]  DIAGNOSIS: Iron deficiency anemia - Plan: CBC & Diff and Retic, Comprehensive metabolic panel (Cmet) - [facility], Ferritin, Iron and TIBC [facility]   PROBLEM LIST:  1. Bilateral pulmonary emboli on 01/31/2009. The patient was on a NuvaRing at that time. Doppler studies of the legs were negative for DVT.  2. 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  3. Polycystic ovary disease.  4. Obesity.  5. Sleep apnea.  6. Uterine fibroids and menorrhagia.  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 Resolved.  8. Diverting loop sigmoid colostomy created on 03/27/2011 with reversal carried out during the late spring or early summer of 2013.  9. Anticoagulation therapy was discontinued in August 2013 at the recommendation of Dr. Odis Hollingshead from [facility].  10. Iron deficiency anemia secondary to excessive menstrual bleeding. A von Willebrand's panel carried out on 11/21/2009 was negative. A celiac panel carried out on 03/06/2012 was negative. Patient received IV Feraheme 510 mg on 11/20/2011 and 11/27/2011.   Chief Complaint  Patient presents with  . Follow-up  . Anemia    CURRENT THERAPY: FeSO4 bid.  Feraheme prn.   INTERVAL HISTORY: [name] 39 y.o. female with a history of iron-deficiency anemia secondary to uterine bleeding is here for follow-up.  She is doing well overall. She still has heavy period, changes every 1 hr in the first 3 days. She has. Every 3 weeks. She was seen by her gynecologist who recommend IUD, but she still reluctant to do it. She is taking iron pill two daily and tolerating well. She has been on a specially diet for her weight lately, no other issues. She had trouble with her IV access for blood draw today.   MEDICAL HISTORY: Past Medical History  Diagnosis Date  .  OBESITY   . 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 POLYCYSTIC OVARIAN DISEASE   . OBSTRUCTIVE SLEEP APNEA   . ANEMIA-IRON DEFICIENCY   . Fistula of intestine to abdominal wall [id number]/7035    complication of myomectomy/LOA 02/2011  . Prothrombin mutation     heterozygous prothrombin II mutation  . Allergy     seasonal allergies  . 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    ALLERGIES:  is allergic to oxycodone; gluten; betadine; and chlorhexidine.  MEDICATIONS: has a current medication list which includes the following prescription(s): activated charcoal, vitamin d3, ferrous sulfate, magnesium, multivitamin, triamcinolone, triamcinolone, UNABLE TO FIND, UNABLE TO FIND, vitamin c, and epinephrine.  SURGICAL HISTORY:  Past Surgical History  Procedure Laterality Date  . Myomectomy abdominal approach  0093    No complications  . Myomectomy  02/12/2011    Procedure: MYOMECTOMY;  Surgeon: Luz Lex, MD;  Location: [facility];  Service: Gynecology;  Laterality: N/A;  . Colostomy  03/2011    Dr. Brantley Stage  . Colostomy reversal      REVIEW OF SYSTEMS:   Constitutional: Denies fevers, chills or abnormal weight loss Eyes:  Denies blurriness of vision Ears, nose, mouth, throat, and face: Denies mucositis or sore throat Respiratory: Denies cough, dyspnea or wheezes Cardiovascular: Denies palpitation, chest discomfort or lower extremity swelling Gastrointestinal:  Denies nausea, heartburn or change in bowel habits Skin: Denies abnormal skin rashes Lymphatics: Denies new lymphadenopathy or easy  bruising Neurological:Denies numbness, tingling or new weaknesses Behavioral/Psych: Mood is stable, no new changes  All other systems were reviewed with the patient and are negative.  PHYSICAL EXAMINATION: ECOG PERFORMANCE STATUS: 0 - Asymptomatic  Blood pressure 125/84, pulse 78, temperature 98.5 F (36.9 C), temperature source Oral, resp. rate 18, height 4\' 10"  (1.473 m), weight 220 lb 3.2 oz (99.882 kg), SpO2 100 %.  GENERAL:alert, no distress and comfortable; morbidly obese  SKIN: skin color, texture, turgor are normal, no rashes or significant lesions EYES: normal, Conjunctiva are pink and non-injected, sclera clear OROPHARYNX:no exudate, no erythema and lips, buccal mucosa, and tongue normal  NECK: supple, thyroid normal size, non-tender, without nodularity LYMPH:  no palpable lymphadenopathy in the cervical, axillary or supraclavicular LUNGS: clear to auscultation and percussion with normal breathing effort HEART: regular rate & rhythm and no murmurs and no lower extremity edema ABDOMEN:abdomen soft, non-tender and normal bowel sounds; obese. Surgical scars well healed.  Musculoskeletal:no cyanosis of digits and no clubbing  NEURO: alert & oriented x 3 with fluent speech, no focal motor/sensory deficits  LABORATORY DATA: CBC Latest Ref Rng 12/01/2014 06/01/2014 02/14/2014  WBC 3.9 - 10.3 10e3/uL 6.6 6.1 7.5  Hemoglobin 11.6 - 15.9 g/dL 14.1 13.5 13.2  Hematocrit 34.8 - 46.6 % 41.0 41.3 40.6  Platelets 145 - 400 10e3/uL 238 224 273    CMP Latest Ref Rng 06/01/2014 07/23/2013 05/18/2013  Glucose 70 - 140 mg/dl 87 93 98  BUN 7.0 - 26.0 mg/dL 14.6 15 12.8  Creatinine 0.6 - 1.1 mg/dL 0.8 0.7 0.7  Sodium 136 - 145 mEq/L 137 135 137  Potassium 3.5 - 5.1 mEq/L 4.0 4.2 4.1  Chloride 96 - 112 mEq/L - 103 -  CO2 22 - 29 mEq/L 24 25 24   Calcium 8.4 - 10.4 mg/dL 9.2 9.3 9.4  Total Protein 6.4 - 8.3 g/dL 7.0 7.3 7.8  Total Bilirubin 0.20 - 1.20 mg/dL 0.37 0.3 0.49  Alkaline Phos 40 -  150 U/L 80 85 112  AST 5 - 34 U/L 16 17 17   ALT 0 - 55 U/L 19 17 16      IMAGING STUDIES:  1. CT scan of the abdomen and pelvis with IV contrast carried out on 02/20/2011 showed abscess collection identified superior to the uterus extending along the left lateral aspect, 10.2 x 4.2 x 5 cm  in size, appearing to extend into the left lateral aspect of the uterus and quest onably site of myomectomy. There was additional gas collection seen in the anterior abdominal wall fascial planes.  There was rounded focal fluid collection with enhancing wall in the region of the left adnexa, possibly an ovarian cyst. There was an open ventral wound anterior abdominal wall of pelvis.  2. CT scan of the abdomen and pelvis with IV contrast on 03/12/2011 showed persistent fistulous tract from the sigmoid colon to the anterior abdominal wall, slight interval decrease in the size of the abscess just superior to the uterus, and stable surgical changes involving the uterus.  3. CT scan of the abdomen and pelvis without IV contrast on 05/09/2011 showed diverting loop colostomy in the left lower quadrant with no complicating features. There was a tiny  residual colocutaneous fistula. There was minimal residual subcutaneous and anterior abdominal wall inflammation, but no abscess.  4. CT scan of the abdomen and pelvis with IV contrast on 06/27/2011 showed left lower quadrant loop colostomy with no extravasation of contrast, although the proximal sigmoid is suboptimally opacified.  The appearance suggested the closure of the fistula that was seen on the study of 05/09/2011.   PROCEDURES:  1. Flexible sigmoidoscopy with submucosal injections on 09/10/2011. At 35 cm from the anus, there was a site of injured colon, hypertrophied mucosa. Abutting this site directly, the lumen of the colon was quite narrowed and the scope could not be passed through it. This area was labeled with Niger ink injection.  2. EGD with duodenal biopsies  to check for celiac sprue were carried out. The EGD was normal.  ASSESSMENT: [name] 38 y.o. female with a history of Iron deficiency anemia - Plan: CBC & Diff and Retic, Comprehensive metabolic panel (Cmet) - [facility], Ferritin, Iron and TIBC [facility]   PLAN:   1. IDA secondary menorrhagia and metrorrhagia.  --Patient is doing well today.  Her hemoglobin was normal today. Ferritin level is pending --Continue FeSO4 bid.   Follow-up with Gyn as needed.    2. History of Hetrozygous Prothrombin II gene mutation complicated by PE (bilateral) while on OCP.  --Seen by [facility] Joan Flores at [facility] who recommended against further a/c.  Patient counseled to take a/c for related to increased risk procedures, prolonged immobilization or hormonal  therapy which may increase her risks of clotting.   3. Morbid obesity. --She is discussing nutrition, diet and exercise with her PCP.   All questions were answered. The patient knows to call the clinic with any problems, questions or concerns. We can certainly see the patient much sooner if necessary.  Paln -lab and follow up in 6 month -I will copy your lab results to your PCP   I spent 10 minutes counseling the patient face to face. The total time spent in the appointment was 15 minutes.    Truitt Merle, MD 12/01/2014 2:56 PM   Addendum Results for [name], [name] (MRN [id number]) as of 12/04/2014 21:20  Ref. Range 12/01/2014 12:42  Iron Latest Ref Range: 41-142 ug/dL 45  UIBC Latest Ref Range: 120-384 ug/dL 298  TIBC Latest Ref Range: 236-444 ug/dL 344  %SAT Latest Ref Range: 21-57 % 13 (L)  Ferritin Latest Ref Range: 9-269 ng/ml 123   Her ferritin in the serum iron level are normal. We'll call patient tomorrow and let her know.  CC: Dr. Lavone Neri

## 2014-12-01 NOTE — Telephone Encounter (Signed)
Appointments made and avs printed for patinet

## 2014-12-08 ENCOUNTER — Telehealth: Payer: Self-pay | Admitting: *Deleted

## 2014-12-08 NOTE — Telephone Encounter (Signed)
Pt advised of lab results. Voiced an understanding. No concerns or questions at this time.

## 2014-12-08 NOTE — Telephone Encounter (Signed)
TC to pt to give lab results- lm for rtn call. Ferritin and Iron levels are normal.

## 2015-05-17 ENCOUNTER — Other Ambulatory Visit: Payer: Self-pay | Admitting: Hematology

## 2015-05-17 DIAGNOSIS — D509 Iron deficiency anemia, unspecified: Secondary | ICD-10-CM

## 2015-05-18 ENCOUNTER — Other Ambulatory Visit (HOSPITAL_BASED_OUTPATIENT_CLINIC_OR_DEPARTMENT_OTHER): Payer: BLUE CROSS/BLUE SHIELD

## 2015-05-18 DIAGNOSIS — D509 Iron deficiency anemia, unspecified: Secondary | ICD-10-CM | POA: Diagnosis not present

## 2015-05-18 LAB — CBC & DIFF AND RETIC
BASO%: 0.5 % (ref 0.0–2.0)
Basophils Absolute: 0 10*3/uL (ref 0.0–0.1)
EOS%: 3.4 % (ref 0.0–7.0)
Eosinophils Absolute: 0.2 10*3/uL (ref 0.0–0.5)
HCT: 40.8 % (ref 34.8–46.6)
HGB: 13.7 g/dL (ref 11.6–15.9)
Immature Retic Fract: 4.1 % (ref 1.60–10.00)
LYMPH#: 2.1 10*3/uL (ref 0.9–3.3)
LYMPH%: 36.9 % (ref 14.0–49.7)
MCH: 28.3 pg (ref 25.1–34.0)
MCHC: 33.6 g/dL (ref 31.5–36.0)
MCV: 84.3 fL (ref 79.5–101.0)
MONO#: 0.3 10*3/uL (ref 0.1–0.9)
MONO%: 5.1 % (ref 0.0–14.0)
NEUT%: 54.1 % (ref 38.4–76.8)
NEUTROS ABS: 3.1 10*3/uL (ref 1.5–6.5)
Platelets: 228 10*3/uL (ref 145–400)
RBC: 4.84 10*6/uL (ref 3.70–5.45)
RDW: 13.2 % (ref 11.2–14.5)
RETIC %: 1.31 % (ref 0.70–2.10)
RETIC CT ABS: 63.4 10*3/uL (ref 33.70–90.70)
WBC: 5.6 10*3/uL (ref 3.9–10.3)

## 2015-05-18 LAB — COMPREHENSIVE METABOLIC PANEL
ALT: 20 U/L (ref 0–55)
AST: 18 U/L (ref 5–34)
Albumin: 3.6 g/dL (ref 3.5–5.0)
Alkaline Phosphatase: 87 U/L (ref 40–150)
Anion Gap: 10 mEq/L (ref 3–11)
BUN: 18 mg/dL (ref 7.0–26.0)
CHLORIDE: 108 meq/L (ref 98–109)
CO2: 20 meq/L — AB (ref 22–29)
CREATININE: 0.8 mg/dL (ref 0.6–1.1)
Calcium: 8.9 mg/dL (ref 8.4–10.4)
EGFR: >90 mL/min/{1.73_m2} (ref >90)
Glucose: 94 mg/dl (ref 70–140)
POTASSIUM: 4 meq/L (ref 3.5–5.1)
Sodium: 138 mEq/L (ref 136–145)
Total Bilirubin: <0.3 mg/dL (ref 0.20–1.20)
Total Protein: 7.2 g/dL (ref 6.4–8.3)

## 2015-05-19 LAB — IRON AND TIBC
%SAT: 15 % — AB (ref 21–57)
Iron: 54 ug/dL (ref 41–142)
TIBC: 346 ug/dL (ref 236–444)
UIBC: 293 ug/dL (ref 120–384)

## 2015-05-19 LAB — FERRITIN: Ferritin: 121 ng/ml (ref 9–269)

## 2015-05-25 ENCOUNTER — Encounter: Payer: Self-pay | Admitting: Hematology

## 2015-05-25 ENCOUNTER — Telehealth: Payer: Self-pay | Admitting: Hematology

## 2015-05-25 ENCOUNTER — Ambulatory Visit (HOSPITAL_BASED_OUTPATIENT_CLINIC_OR_DEPARTMENT_OTHER): Payer: BLUE CROSS/BLUE SHIELD | Admitting: Hematology

## 2015-05-25 VITALS — BP 135/87 | HR 90 | Temp 99.0°F | Resp 21 | Ht <= 58 in | Wt 211.5 lb

## 2015-05-25 DIAGNOSIS — D509 Iron deficiency anemia, unspecified: Secondary | ICD-10-CM

## 2015-05-25 DIAGNOSIS — Z86711 Personal history of pulmonary embolism: Secondary | ICD-10-CM | POA: Diagnosis not present

## 2015-05-25 DIAGNOSIS — Z86718 Personal history of other venous thrombosis and embolism: Secondary | ICD-10-CM | POA: Insufficient documentation

## 2015-05-25 DIAGNOSIS — Z862 Personal history of diseases of the blood and blood-forming organs and certain disorders involving the immune mechanism: Secondary | ICD-10-CM | POA: Diagnosis not present

## 2015-05-25 DIAGNOSIS — D6852 Prothrombin gene mutation: Secondary | ICD-10-CM | POA: Diagnosis not present

## 2015-05-25 NOTE — Telephone Encounter (Signed)
Gave pt appt for 9/15 + 02/23/16.

## 2015-05-25 NOTE — Progress Notes (Signed)
[facility] OFFICE PROGRESS NOTE  [name], [place] [id number]  DIAGNOSIS: Iron deficiency anemia  Personal history of venous thrombosis and embolism  Prothrombin mutation ([place])   PROBLEM LIST:  1. Bilateral pulmonary emboli on 01/31/2009. The patient was on a NuvaRing at that time. Doppler studies of the legs were negative for DVT.  2. 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  3. Polycystic ovary disease.  4. Obesity.  5. Sleep apnea.  6. Uterine fibroids and menorrhagia.  7. Colocutaneous fistula in September 0000000 as a complication of uterine myotomy which occurred on 02/12/2011. Resolved.  8. Diverting loop sigmoid colostomy created on 03/27/2011 with reversal carried out during the late spring or early summer of 2013.  9. Anticoagulation therapy was discontinued in August 2013 at the recommendation of Dr. Odis [name] from [facility].  10. Iron deficiency anemia secondary to excessive menstrual bleeding. A von Willebrand's panel carried out on 11/21/2009 was negative. A celiac panel carried out on 03/06/2012 was negative. Patient received IV Feraheme 510 mg on 11/20/2011 and 11/27/2011.   Chief Complaint  Patient presents with  . Follow-up  . Anemia    CURRENT THERAPY: FeSO4 bid.  Feraheme prn, last infusion in 11/2011   INTERVAL HISTORY: [name] 39 y.o. female with a history of iron-deficiency anemia secondary to uterine bleeding is here for follow-up.  She is doing well overall. She has been trying to lose some weight, her primary care physician recommend cardiac exercise, but she has dyspnea when she exercise, and could not tolerate much. She does some walking and light exercise. Her menstrual period is still pretty heavy, it lasts about 7 days, every 3 weeks. She has not tried IUD. Judgment denies any other bleeding or other new symptoms.  MEDICAL HISTORY: Past Medical History  Diagnosis  Date  . OBESITY   . 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 POLYCYSTIC OVARIAN DISEASE   . OBSTRUCTIVE SLEEP APNEA   . ANEMIA-IRON DEFICIENCY   . 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 Prothrombin mutation ([place])     heterozygous prothrombin II mutation  . Allergy     seasonal allergies  . 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    ALLERGIES:  is allergic to oxycodone; gluten; betadine; and chlorhexidine.  MEDICATIONS: has a current medication list which includes the following prescription(s): acetaminophen, activated charcoal, vitamin d3, ferrous sulfate, magnesium, multivitamin, triamcinolone, triamcinolone, UNABLE TO FIND, UNABLE TO FIND, vitamin c, epinephrine, and fish oil.  SURGICAL HISTORY:  Past Surgical History  Procedure Laterality Date  . Myomectomy abdominal approach  0000000    No complications  . Myomectomy  02/12/2011    Procedure: MYOMECTOMY;  Surgeon: Alicia Lex, MD;  Location: [facility];  Service: Gynecology;  Laterality: N/A;  . Colostomy  03/2011    Dr. Brantley [name]  . Colostomy reversal      REVIEW OF SYSTEMS:   Constitutional: Denies fevers, chills or  abnormal weight loss Eyes: Denies blurriness of vision Ears, nose, mouth, throat, and face: Denies mucositis or sore throat Respiratory: Denies cough, dyspnea or wheezes Cardiovascular: Denies palpitation, chest discomfort or lower extremity swelling Gastrointestinal:  Denies nausea, heartburn or  change in bowel habits Skin: Denies abnormal skin rashes Lymphatics: Denies new lymphadenopathy or easy bruising Neurological:Denies numbness, tingling or new weaknesses Behavioral/Psych: Mood is stable, no new changes  All other systems were reviewed with the patient and are negative.  PHYSICAL EXAMINATION: ECOG PERFORMANCE STATUS: 0 - Asymptomatic  Blood pressure 135/87, pulse 90, temperature 99 F (37.2 C), temperature source Oral, resp. rate 21, height 4\' 10"  (1.473 m), weight 211 lb 8 oz (95.936 kg), SpO2 99 %.  GENERAL:alert, no distress and comfortable; morbidly obese  SKIN: skin color, texture, turgor are normal, no rashes or significant lesions EYES: normal, Conjunctiva are pink and non-injected, sclera clear OROPHARYNX:no exudate, no erythema and lips, buccal mucosa, and tongue normal  NECK: supple, thyroid normal size, non-tender, without nodularity LYMPH:  no palpable lymphadenopathy in the cervical, axillary or supraclavicular LUNGS: clear to auscultation and percussion with normal breathing effort HEART: regular rate & rhythm and no murmurs and no lower extremity edema ABDOMEN:abdomen soft, non-tender and normal bowel sounds; obese. Surgical scars well healed.  Musculoskeletal:no cyanosis of digits and no clubbing  NEURO: alert & oriented x 3 with fluent speech, no focal motor/sensory deficits  LABORATORY DATA: CBC Latest Ref Rng 05/18/2015 12/01/2014 06/01/2014  WBC 3.9 - 10.3 10e3/uL 5.6 6.6 6.1  Hemoglobin 11.6 - 15.9 g/dL 13.7 14.1 13.5  Hematocrit 34.8 - 46.6 % 40.8 41.0 41.3  Platelets 145 - 400 10e3/uL 228 238 224    CMP Latest Ref Rng 05/18/2015 06/01/2014 07/23/2013  Glucose 70 - 140 mg/dl 94 87 93  BUN 7.0 - 26.0 mg/dL 18.0 14.6 15  Creatinine 0.6 - 1.1 mg/dL 0.8 0.8 0.7  Sodium 136 - 145 mEq/L 138 137 135  Potassium 3.5 - 5.1 mEq/L 4.0 4.0 4.2  Chloride 96 - 112 mEq/L - - 103  CO2 22 - 29 mEq/L 20(L) 24 25  Calcium 8.4 - 10.4 mg/dL 8.9 9.2 9.3  Total  Protein 6.4 - 8.3 g/dL 7.2 7.0 7.3  Total Bilirubin 0.20 - 1.20 mg/dL <0.30 0.37 0.3  Alkaline Phos 40 - 150 U/L 87 80 85  AST 5 - 34 U/L 18 16 17   ALT 0 - 55 U/L 20 19 17    Results for [name] (MRN [id number]) as of 05/25/2015 08:28  Ref. Range 12/01/2014 12:42 05/18/2015 14:56  Iron Latest Ref Range: 41-142 ug/dL 45 54  UIBC Latest Ref Range: 120-384 ug/dL 298 293  TIBC Latest Ref Range: 236-444 ug/dL 344 346  %SAT Latest Ref Range: 21-57 % 13 (L) 15 (L)  Ferritin Latest Ref Range: 9-269 ng/ml 123 121    IMAGING STUDIES:  1. CT scan of the abdomen and pelvis with IV contrast carried out on 02/20/2011 showed abscess collection identified superior to the uterus extending along the left lateral aspect, 10.2 x 4.2 x 5 cm  in size, appearing to extend into the left lateral aspect of the uterus and quest onably site of myomectomy. There was additional gas collection seen in the anterior abdominal wall fascial planes.  There was rounded focal fluid collection with enhancing wall in the region of the left adnexa, possibly an ovarian cyst. There was an open ventral wound anterior abdominal wall of pelvis.  2. CT scan of the abdomen and pelvis with  IV contrast on 03/12/2011 showed persistent fistulous tract from the sigmoid colon to the anterior abdominal wall, slight interval decrease in the size of the abscess just superior to the uterus, and stable surgical changes involving the uterus.  3. CT scan of the abdomen and pelvis without IV contrast on 05/09/2011 showed diverting loop colostomy in the left lower quadrant with no complicating features. There was a tiny residual colocutaneous fistula. There was minimal residual subcutaneous and anterior abdominal wall inflammation, but no abscess.  4. CT scan of the abdomen and pelvis with IV contrast on 06/27/2011 showed left lower quadrant loop colostomy with no extravasation of contrast, although the proximal sigmoid is suboptimally opacified.  The  appearance suggested the closure of the fistula that was seen on the study of 05/09/2011.   PROCEDURES:  1. Flexible sigmoidoscopy with submucosal injections on 09/10/2011. At 35 cm from the anus, there was a site of injured colon, hypertrophied mucosa. Abutting this site directly, the lumen of the colon was quite narrowed and the scope could not be passed through it. This area was labeled with Niger ink injection.  2. EGD with duodenal biopsies to check for celiac sprue were carried out. The EGD was normal.  ASSESSMENT: [name] 39 y.o. female with a history of Iron deficiency anemia  Personal history of venous thrombosis and embolism  Prothrombin mutation ([place])   PLAN:   1. IDA secondary menorrhagia and metrorrhagia.  -Her anemia has resolved, she is on oral ferrous sulfate. Tolerating well. Her last IV Shirlean Kelly was in June 2013. --Continue FeSO4 bid.   Follow-up with Gyn as needed.    2. History of Hetrozygous Prothrombin II gene mutation complicated by PE (bilateral) while on OCP.  --Seen by Dr. Joan Flores at [facility] who recommended against further a/c.  Patient counseled to take a/c for related to increased risk procedures, prolonged immobilization or hormonal  therapy which may increase her risks of clotting.  -I recommend her to take Aspirin 81mg  daily, she agrees.   3. Morbid obesity. -I encouraged her to continue to exercise and diet to lose some weight.  All questions were answered. The patient knows to call the clinic with any problems, questions or concerns. We can certainly see the patient much sooner if necessary.  Paln -She will see her primary care physician in March. I'll see her back in 9 months with lab 1 week before. If stable, I'll see her once a year in the future.  I spent 10 minutes counseling the patient face to face. The total time spent in the appointment was 15 minutes.    Truitt Merle, MD 05/25/2015 2:41 PM

## 2016-02-15 ENCOUNTER — Telehealth: Payer: Self-pay | Admitting: Hematology

## 2016-02-15 NOTE — Telephone Encounter (Signed)
02/16/2016 and 02/23/2016 Appointments canceled per patient request.

## 2016-02-16 ENCOUNTER — Other Ambulatory Visit: Payer: BLUE CROSS/BLUE SHIELD

## 2016-02-23 ENCOUNTER — Ambulatory Visit: Payer: BLUE CROSS/BLUE SHIELD | Admitting: Hematology

## 2017-11-10 ENCOUNTER — Inpatient Hospital Stay: Payer: Self-pay | Admitting: Hematology

## 2017-11-10 ENCOUNTER — Inpatient Hospital Stay: Payer: Self-pay

## 2019-03-15 ENCOUNTER — Other Ambulatory Visit: Payer: Self-pay

## 2019-03-15 DIAGNOSIS — Z20822 Contact with and (suspected) exposure to covid-19: Secondary | ICD-10-CM

## 2019-03-16 LAB — NOVEL CORONAVIRUS, NAA: SARS-CoV-2, NAA: NOT DETECTED

## 2019-05-06 ENCOUNTER — Other Ambulatory Visit: Payer: Self-pay

## 2019-05-06 DIAGNOSIS — Z20822 Contact with and (suspected) exposure to covid-19: Secondary | ICD-10-CM

## 2019-05-07 LAB — NOVEL CORONAVIRUS, NAA: SARS-CoV-2, NAA: DETECTED — AB
